# Patient Record
Sex: Female | Born: 1960 | Race: Black or African American | Hispanic: No | Marital: Married | Smoking: Never smoker
Health system: Southern US, Community
[De-identification: ages and names within clinical notes are randomized; demographics above are authoritative.]

## PROBLEM LIST (undated history)

## (undated) DIAGNOSIS — K56609 Unspecified intestinal obstruction, unspecified as to partial versus complete obstruction: Secondary | ICD-10-CM

## (undated) DIAGNOSIS — G3184 Mild cognitive impairment, so stated: Secondary | ICD-10-CM

## (undated) DIAGNOSIS — E7800 Pure hypercholesterolemia, unspecified: Secondary | ICD-10-CM

## (undated) DIAGNOSIS — T8859XA Other complications of anesthesia, initial encounter: Secondary | ICD-10-CM

## (undated) DIAGNOSIS — K5651 Intestinal adhesions [bands], with partial obstruction: Secondary | ICD-10-CM

## (undated) DIAGNOSIS — G479 Sleep disorder, unspecified: Secondary | ICD-10-CM

## (undated) DIAGNOSIS — F419 Anxiety disorder, unspecified: Secondary | ICD-10-CM

## (undated) DIAGNOSIS — O009 Unspecified ectopic pregnancy without intrauterine pregnancy: Secondary | ICD-10-CM

## (undated) DIAGNOSIS — Z9049 Acquired absence of other specified parts of digestive tract: Secondary | ICD-10-CM

## (undated) DIAGNOSIS — F329 Major depressive disorder, single episode, unspecified: Secondary | ICD-10-CM

## (undated) DIAGNOSIS — D649 Anemia, unspecified: Secondary | ICD-10-CM

## (undated) DIAGNOSIS — T4145XA Adverse effect of unspecified anesthetic, initial encounter: Secondary | ICD-10-CM

## (undated) DIAGNOSIS — E119 Type 2 diabetes mellitus without complications: Secondary | ICD-10-CM

## (undated) DIAGNOSIS — K56600 Partial intestinal obstruction, unspecified as to cause: Secondary | ICD-10-CM

## (undated) DIAGNOSIS — I10 Essential (primary) hypertension: Secondary | ICD-10-CM

## (undated) DIAGNOSIS — R7303 Prediabetes: Secondary | ICD-10-CM

## (undated) DIAGNOSIS — F32A Depression, unspecified: Secondary | ICD-10-CM

## (undated) DIAGNOSIS — K529 Noninfective gastroenteritis and colitis, unspecified: Secondary | ICD-10-CM

## (undated) HISTORY — DX: Intestinal adhesions (bands), with partial obstruction: K56.51

## (undated) HISTORY — DX: Anemia, unspecified: D64.9

## (undated) HISTORY — PX: EXPLORATORY LAPAROTOMY: SUR591

## (undated) HISTORY — DX: Acquired absence of other specified parts of digestive tract: Z90.49

## (undated) HISTORY — DX: Mild cognitive impairment, so stated: G31.84

## (undated) HISTORY — PX: UTERINE FIBROID SURGERY: SHX826

## (undated) HISTORY — DX: Unspecified intestinal obstruction, unspecified as to partial versus complete obstruction: K56.609

## (undated) HISTORY — DX: Partial intestinal obstruction, unspecified as to cause: K56.600

## (undated) HISTORY — PX: ESOPHAGOGASTRODUODENOSCOPY: SHX1529

## (undated) HISTORY — DX: Anxiety disorder, unspecified: F41.9

## (undated) HISTORY — PX: BREAST SURGERY: SHX581

## (undated) HISTORY — DX: Noninfective gastroenteritis and colitis, unspecified: K52.9

## (undated) HISTORY — PX: ECTOPIC PREGNANCY SURGERY: SHX613

## (undated) HISTORY — PX: TONSILLECTOMY: SUR1361

## (undated) HISTORY — PX: COLONOSCOPY: SHX174

## (undated) SURGERY — LYSIS, ADHESIONS, LAPAROSCOPIC
Anesthesia: General

---

## 1991-04-11 HISTORY — PX: BREAST REDUCTION SURGERY: SHX8

## 1997-10-19 ENCOUNTER — Emergency Department (HOSPITAL_COMMUNITY): Admission: EM | Admit: 1997-10-19 | Discharge: 1997-10-19 | Payer: Self-pay | Admitting: Emergency Medicine

## 1997-12-23 ENCOUNTER — Other Ambulatory Visit: Admission: RE | Admit: 1997-12-23 | Discharge: 1997-12-23 | Payer: Self-pay | Admitting: Internal Medicine

## 1999-11-01 ENCOUNTER — Encounter: Payer: Self-pay | Admitting: Gynecology

## 1999-11-01 ENCOUNTER — Ambulatory Visit (HOSPITAL_COMMUNITY): Admission: RE | Admit: 1999-11-01 | Discharge: 1999-11-01 | Payer: Self-pay | Admitting: Gynecology

## 2000-05-31 ENCOUNTER — Encounter: Admission: RE | Admit: 2000-05-31 | Discharge: 2000-08-29 | Payer: Self-pay | Admitting: Internal Medicine

## 2000-09-26 ENCOUNTER — Ambulatory Visit (HOSPITAL_COMMUNITY): Admission: RE | Admit: 2000-09-26 | Discharge: 2000-09-26 | Payer: Self-pay | Admitting: Internal Medicine

## 2000-09-26 ENCOUNTER — Encounter: Payer: Self-pay | Admitting: Internal Medicine

## 2000-09-27 ENCOUNTER — Ambulatory Visit (HOSPITAL_COMMUNITY): Admission: RE | Admit: 2000-09-27 | Discharge: 2000-09-27 | Payer: Self-pay | Admitting: *Deleted

## 2000-11-09 ENCOUNTER — Emergency Department (HOSPITAL_COMMUNITY): Admission: EM | Admit: 2000-11-09 | Discharge: 2000-11-09 | Payer: Self-pay | Admitting: Emergency Medicine

## 2000-11-09 ENCOUNTER — Encounter: Payer: Self-pay | Admitting: Emergency Medicine

## 2001-01-30 ENCOUNTER — Other Ambulatory Visit: Admission: RE | Admit: 2001-01-30 | Discharge: 2001-01-30 | Payer: Self-pay | Admitting: Gynecology

## 2001-04-10 ENCOUNTER — Emergency Department (HOSPITAL_COMMUNITY): Admission: EM | Admit: 2001-04-10 | Discharge: 2001-04-10 | Payer: Self-pay | Admitting: Emergency Medicine

## 2001-04-10 HISTORY — PX: ABDOMINAL HYSTERECTOMY: SHX81

## 2001-05-17 ENCOUNTER — Ambulatory Visit (HOSPITAL_COMMUNITY): Admission: RE | Admit: 2001-05-17 | Discharge: 2001-05-17 | Payer: Self-pay | Admitting: Obstetrics and Gynecology

## 2001-05-21 ENCOUNTER — Inpatient Hospital Stay (HOSPITAL_COMMUNITY): Admission: AD | Admit: 2001-05-21 | Discharge: 2001-05-22 | Payer: Self-pay | Admitting: Obstetrics and Gynecology

## 2001-05-21 ENCOUNTER — Encounter: Payer: Self-pay | Admitting: Obstetrics and Gynecology

## 2001-06-14 ENCOUNTER — Inpatient Hospital Stay (HOSPITAL_COMMUNITY): Admission: AD | Admit: 2001-06-14 | Discharge: 2001-06-14 | Payer: Self-pay | Admitting: Obstetrics and Gynecology

## 2001-06-29 ENCOUNTER — Inpatient Hospital Stay (HOSPITAL_COMMUNITY): Admission: AD | Admit: 2001-06-29 | Discharge: 2001-06-29 | Payer: Self-pay | Admitting: Obstetrics and Gynecology

## 2001-08-30 ENCOUNTER — Inpatient Hospital Stay (HOSPITAL_COMMUNITY): Admission: AD | Admit: 2001-08-30 | Discharge: 2001-08-30 | Payer: Self-pay | Admitting: Obstetrics and Gynecology

## 2001-10-01 ENCOUNTER — Encounter: Admission: RE | Admit: 2001-10-01 | Discharge: 2001-10-01 | Payer: Self-pay | Admitting: Obstetrics and Gynecology

## 2001-10-01 ENCOUNTER — Encounter (INDEPENDENT_AMBULATORY_CARE_PROVIDER_SITE_OTHER): Payer: Self-pay | Admitting: Specialist

## 2001-10-17 ENCOUNTER — Encounter: Payer: Self-pay | Admitting: Obstetrics and Gynecology

## 2001-10-17 ENCOUNTER — Inpatient Hospital Stay (HOSPITAL_COMMUNITY): Admission: AD | Admit: 2001-10-17 | Discharge: 2001-10-17 | Payer: Self-pay | Admitting: Obstetrics and Gynecology

## 2001-11-10 ENCOUNTER — Inpatient Hospital Stay (HOSPITAL_COMMUNITY): Admission: AD | Admit: 2001-11-10 | Discharge: 2001-11-10 | Payer: Self-pay | Admitting: Obstetrics and Gynecology

## 2001-11-16 ENCOUNTER — Inpatient Hospital Stay (HOSPITAL_COMMUNITY): Admission: AD | Admit: 2001-11-16 | Discharge: 2001-11-16 | Payer: Self-pay | Admitting: Obstetrics and Gynecology

## 2001-11-17 ENCOUNTER — Inpatient Hospital Stay (HOSPITAL_COMMUNITY): Admission: AD | Admit: 2001-11-17 | Discharge: 2001-11-23 | Payer: Self-pay | Admitting: Obstetrics and Gynecology

## 2001-11-24 ENCOUNTER — Encounter: Admission: RE | Admit: 2001-11-24 | Discharge: 2001-12-24 | Payer: Self-pay | Admitting: Obstetrics and Gynecology

## 2002-01-07 ENCOUNTER — Other Ambulatory Visit: Admission: RE | Admit: 2002-01-07 | Discharge: 2002-01-07 | Payer: Self-pay | Admitting: Obstetrics and Gynecology

## 2002-03-31 ENCOUNTER — Encounter (INDEPENDENT_AMBULATORY_CARE_PROVIDER_SITE_OTHER): Payer: Self-pay

## 2002-03-31 ENCOUNTER — Inpatient Hospital Stay (HOSPITAL_COMMUNITY): Admission: RE | Admit: 2002-03-31 | Discharge: 2002-04-02 | Payer: Self-pay | Admitting: Obstetrics and Gynecology

## 2002-04-04 ENCOUNTER — Inpatient Hospital Stay (HOSPITAL_COMMUNITY): Admission: AD | Admit: 2002-04-04 | Discharge: 2002-04-08 | Payer: Self-pay | Admitting: Obstetrics and Gynecology

## 2002-04-04 ENCOUNTER — Encounter: Payer: Self-pay | Admitting: Obstetrics and Gynecology

## 2002-04-06 ENCOUNTER — Encounter: Payer: Self-pay | Admitting: Obstetrics and Gynecology

## 2003-05-11 ENCOUNTER — Encounter: Admission: RE | Admit: 2003-05-11 | Discharge: 2003-06-25 | Payer: Self-pay | Admitting: Occupational Medicine

## 2004-02-02 ENCOUNTER — Other Ambulatory Visit: Admission: RE | Admit: 2004-02-02 | Discharge: 2004-02-02 | Payer: Self-pay | Admitting: Obstetrics and Gynecology

## 2006-05-14 ENCOUNTER — Encounter: Admission: RE | Admit: 2006-05-14 | Discharge: 2006-05-14 | Payer: Self-pay | Admitting: *Deleted

## 2007-02-04 ENCOUNTER — Emergency Department (HOSPITAL_COMMUNITY): Admission: EM | Admit: 2007-02-04 | Discharge: 2007-02-04 | Payer: Self-pay | Admitting: Emergency Medicine

## 2010-01-08 ENCOUNTER — Emergency Department (HOSPITAL_COMMUNITY): Admission: EM | Admit: 2010-01-08 | Discharge: 2010-01-09 | Payer: Self-pay | Admitting: Emergency Medicine

## 2010-06-09 ENCOUNTER — Inpatient Hospital Stay (HOSPITAL_COMMUNITY)
Admission: AD | Admit: 2010-06-09 | Discharge: 2010-06-11 | DRG: 181 | Disposition: A | Discharge status: Home / Self Care | Payer: Federal, State, Local not specified - PPO | Source: Ambulatory Visit | Attending: Internal Medicine | Admitting: Internal Medicine

## 2010-06-09 ENCOUNTER — Inpatient Hospital Stay (HOSPITAL_COMMUNITY): Payer: Federal, State, Local not specified - PPO

## 2010-06-09 DIAGNOSIS — F3289 Other specified depressive episodes: Secondary | ICD-10-CM | POA: Diagnosis present

## 2010-06-09 DIAGNOSIS — I10 Essential (primary) hypertension: Secondary | ICD-10-CM | POA: Diagnosis present

## 2010-06-09 DIAGNOSIS — K56609 Unspecified intestinal obstruction, unspecified as to partial versus complete obstruction: Principal | ICD-10-CM | POA: Diagnosis present

## 2010-06-09 DIAGNOSIS — F329 Major depressive disorder, single episode, unspecified: Secondary | ICD-10-CM | POA: Diagnosis present

## 2010-06-09 DIAGNOSIS — E119 Type 2 diabetes mellitus without complications: Secondary | ICD-10-CM | POA: Diagnosis present

## 2010-06-09 DIAGNOSIS — K5289 Other specified noninfective gastroenteritis and colitis: Secondary | ICD-10-CM | POA: Diagnosis present

## 2010-06-09 LAB — CBC
HCT: 39.3 % (ref 36.0–46.0)
Hemoglobin: 12.9 g/dL (ref 12.0–15.0)
MCH: 26.1 pg (ref 26.0–34.0)
MCHC: 32.8 g/dL (ref 30.0–36.0)
MCV: 79.4 fL (ref 78.0–100.0)
Platelets: 270 10*3/uL (ref 150–400)
RDW: 13.6 % (ref 11.5–15.5)
WBC: 12.7 10*3/uL — ABNORMAL HIGH (ref 4.0–10.5)

## 2010-06-09 LAB — COMPREHENSIVE METABOLIC PANEL
ALT: 13 U/L (ref 0–35)
AST: 16 U/L (ref 0–37)
Albumin: 3.4 g/dL — ABNORMAL LOW (ref 3.5–5.2)
BUN: 11 mg/dL (ref 6–23)
CO2: 27 mEq/L (ref 19–32)
Calcium: 9.1 mg/dL (ref 8.4–10.5)
Creatinine, Ser: 0.94 mg/dL (ref 0.4–1.2)
GFR calc Af Amer: >60 mL/min (ref >60)
GFR calc non Af Amer: >60 mL/min (ref >60)
Glucose, Bld: 85 mg/dL (ref 70–99)
Potassium: 3.5 mEq/L (ref 3.5–5.1)
Sodium: 136 mEq/L (ref 135–145)
Total Protein: 6.8 g/dL (ref 6.0–8.3)

## 2010-06-09 LAB — GLUCOSE, CAPILLARY
Glucose-Capillary: 77 mg/dL (ref 70–99)
Glucose-Capillary: 94 mg/dL (ref 70–99)

## 2010-06-09 LAB — PROTIME-INR
INR: 1 (ref 0.00–1.49)
Prothrombin Time: 13.4 seconds (ref 11.6–15.2)

## 2010-06-10 ENCOUNTER — Inpatient Hospital Stay (HOSPITAL_COMMUNITY): Payer: Federal, State, Local not specified - PPO

## 2010-06-10 LAB — URINALYSIS, ROUTINE W REFLEX MICROSCOPIC
Ketones, ur: 40 mg/dL — AB
Nitrite: NEGATIVE
Specific Gravity, Urine: 1.011 (ref 1.005–1.030)
Urine Glucose, Fasting: NEGATIVE mg/dL
pH: 6 (ref 5.0–8.0)

## 2010-06-10 LAB — DIFFERENTIAL
Basophils Absolute: 0 10*3/uL (ref 0.0–0.1)
Basophils Relative: 0 % (ref 0–1)
Eosinophils Absolute: 0.2 10*3/uL (ref 0.0–0.7)
Eosinophils Relative: 2 % (ref 0–5)
Lymphs Abs: 2.4 10*3/uL (ref 0.7–4.0)
Monocytes Absolute: 0.6 10*3/uL (ref 0.1–1.0)
Monocytes Relative: 7 % (ref 3–12)
Neutro Abs: 5.7 10*3/uL (ref 1.7–7.7)
Neutrophils Relative %: 64 % (ref 43–77)

## 2010-06-10 LAB — GLUCOSE, CAPILLARY
Glucose-Capillary: 82 mg/dL (ref 70–99)
Glucose-Capillary: 105 mg/dL — ABNORMAL HIGH (ref 70–99)
Glucose-Capillary: 119 mg/dL — ABNORMAL HIGH (ref 70–99)

## 2010-06-10 LAB — URINE MICROSCOPIC-ADD ON

## 2010-06-10 LAB — BASIC METABOLIC PANEL
BUN: 8 mg/dL (ref 6–23)
CO2: 28 mEq/L (ref 19–32)
Calcium: 8.5 mg/dL (ref 8.4–10.5)
GFR calc Af Amer: >60 mL/min (ref >60)
GFR calc non Af Amer: >60 mL/min (ref >60)
Glucose, Bld: 103 mg/dL — ABNORMAL HIGH (ref 70–99)

## 2010-06-10 LAB — CBC
Hemoglobin: 10.7 g/dL — ABNORMAL LOW (ref 12.0–15.0)
MCH: 25.5 pg — ABNORMAL LOW (ref 26.0–34.0)
MCHC: 32 g/dL (ref 30.0–36.0)
Platelets: 237 10*3/uL (ref 150–400)
RBC: 4.19 MIL/uL (ref 3.87–5.11)
RDW: 13.6 % (ref 11.5–15.5)
WBC: 8.9 10*3/uL (ref 4.0–10.5)

## 2010-06-10 MED ORDER — IOHEXOL 300 MG/ML  SOLN
80.0000 mL | Freq: Once | INTRAMUSCULAR | Status: AC | PRN
  Administered 2010-06-10: 80 mL via INTRAVENOUS

## 2010-06-11 LAB — BASIC METABOLIC PANEL
BUN: 3 mg/dL — ABNORMAL LOW (ref 6–23)
CO2: 29 mEq/L (ref 19–32)
Chloride: 107 mEq/L (ref 96–112)
Creatinine, Ser: 0.86 mg/dL (ref 0.4–1.2)
GFR calc Af Amer: >60 mL/min (ref >60)
GFR calc non Af Amer: >60 mL/min (ref >60)
Glucose, Bld: 120 mg/dL — ABNORMAL HIGH (ref 70–99)
Sodium: 140 mEq/L (ref 135–145)

## 2010-06-13 NOTE — Discharge Summary (Signed)
NAMEMarland Kitchen  BALEIGH, Charlene                  ACCOUNT NO.:  [id number]  MEDICAL RECORD NO.:  [id number]           PATIENT TYPE:  I  LOCATION:  5123                         FACILITY:  [facility]  PHYSICIAN:  Candyce Churn, M.D.DATE OF BIRTH:  Oct 24, 1960  DATE OF ADMISSION:  06/09/2010 DATE OF DISCHARGE:  06/11/2010                              DISCHARGE SUMMARY   DISCHARGE DIAGNOSES: 1. Small bowel obstruction, recurrent, resolved. 2. History of hypertension. 3. History of dyslipidemia. 4. History diet-controlled diabetes. 5. History of depression.  CONSULTATIONS:  General Surgery June 09, 2010, Dr. Maisie Fus A. Cornett.  The patient has history of small bowel obstruction in 2000 and also partial small bowel obstruction in 2003.  DISCHARGE MEDICATIONS: 1. Klonopin 0.5 mg p.o. b.i.d. p.r.n. 2. Micardis 40 mg daily. 3. Simvastatin 20 mg daily. 4. Wellbutrin 100 mg daily.  PAST SURGICAL HISTORY: 1. Hysterectomy. 2. Ectopic pregnancy. 3. Intra-abdominal adhesions noted back during hysterectomy in 2003     and she had a procedure in 2000 for small bowel obstruction, but no     operative note available.  ALLERGIES: 1. AMOXICILLIN. 2. IBUPROFEN.  RADIOGRAPHIC PROCEDURES DURING THIS HOSPITALIZATION: 1. Abdominal x-ray on March 1 revealed focal left upper quadrant ileus     versus early partial small bowel obstruction. 2. CT of the abdomen and pelvis on June 10, 2010, revealed indistinct     mildly thick wall bowel loop in the left lower quadrant resembling     possible enteritis.  There was moderate stool burden in the     rectosigmoid colon.  There was also mild associated mesenteric     lymph node prominence, some free fluid suggesting possible     enteritis with mesenteric adenitis. 3. Abdominal x-ray on June 10, 2010, reveals no evidence of bowel     obstruction or free intraperitoneal air.  DISCHARGE LABORATORY DATA:  CBC from June 10, 2010, revealed white count 8900,  hemoglobin 10.7, platelet count 237,000 with normal differential. BMET on discharge revealed sodium 140, potassium 3.8, chloride 107, bicarb 29, glucose 120, BUN 3, creatinine 0.86.  Urinalysis from March 1 revealed many squamous cells with 3-6 white cells and 3-6 red cells, and few bacteria, and was cloudy, but otherwise negative.  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  She has had numerous GYN surgeries for ectopic pregnancy and hysterectomy for fibroids in the past.  She has had documented adhesions from prior surgeries.  She started having pain 3 days prior to admission and it was infraumbilical.  It became diffuse on the day prior to admission and became quite colicky, and was actually 10/10 at its worst.  She had not had a bowel movement or flatus for several days.  She has also had no p.o. intake for several days.  She was seen at the [facility] and x-rays were done at the office revealing partial small bowel obstruction and she went home, and then pain increased and she was admitted directly to the  hospital.  She had a fever of 101 at [facility] and was complaining of chills on admission.  On admission, she was found to have normal vital signs except for slightly elevated blood pressure of 146/95.  Pulse was 84, temperature of 98.6.  X-ray was consistent with partial small bowel obstruction. She was seen in consultation by Surgery who recommended conservative care, and over 48 hours, her symptoms resolved and her SBO resolved.  She will be discharged home on soft diet for 2-3 days, then regular diet.  CONDITION ON DISCHARGE:  Much improved with resolution of small bowel obstruction.  She will follow with Dr. Trula Slade in 2 weeks and call if she develops nausea, vomiting or  abdominal pain or fever or chills.  Time spent discussing discharge with patient and reviewing chart and performing discharge summary was less than 30 minutes.     Candyce Churn, M.D.     RNG/MEDQ  D:  06/11/2010  T:  06/12/2010  Job:  [id number]  cc:   Thomas A. Cornett, M.D. Deirdre Peer. Polite, M.D.  Electronically Signed by Marden Noble M.D. on 06/13/2010 10:28:01 PM

## 2010-06-14 NOTE — Consult Note (Addendum)
[name], [name]                  ACCOUNT NO.:  [id number]  MEDICAL RECORD NO.:  [id number]           PATIENT TYPE:  I  LOCATION:  5123                         FACILITY:  [facility]  PHYSICIAN:  [name], M.D.DATE OF BIRTH:  10/08/1960  DATE OF CONSULTATION:  06/09/2010 DATE OF DISCHARGE:                                CONSULTATION   REQUESTING PHYSICIAN:  Charlene Massed, MD, [facility].  REASON FOR CONSULTATION:  Abdominal pain.  HISTORY OF PRESENT ILLNESS:  The patient is a 50 year old female with a 3-day history of diffuse abdominal pain.  It started 3 days ago in epigastrium.  Then, it began to gravitate toward the umbilicus and involved both left and right lower quadrants.  It is crampy, sharp in nature, almost like contraction-type pain, constant.  She had 1 episode of nausea and vomiting this morning.  No bowel movement for 3 days or passing flatus.  The pain is 10/10.  Nothing seems to make it better or worse.  She was recently admitted by the hospitalist.  No workup has been done yet.  I was asked to see her for abdominal pain.  PAST MEDICAL HISTORY: 1. Hypertension. 2. Hyperglycemia. 3. Hyperlipidemia. 4. History of small bowel obstruction in 2000 and then again was seen     in 2003 for a partial small bowel obstruction which resolved     nonoperatively.  PAST SURGICAL HISTORY: 1. Hysterectomy. 2. Ectopic pregnancy. 3. Intra-abdominal adhesions noted back in her hysterectomy back in     2003. 4. Apparently had a procedure in 2000 for small bowel obstruction.  No     operative note reported from that.  ALLERGIES: 1. AMOXICILLIN. 2. IBUPROFEN.  MEDICATIONS:  Micardis and Lipitor.  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  She denies tobacco or alcohol use.  REVIEW OF SYSTEMS:  As above, otherwise negative x15.  PHYSICAL EXAMINATION:  VITAL SIGNS:  Temperature 98, pulse in the 90s, blood pressure 146/76. GENERAL  APPEARANCE:  Female, in mild distress. HEENT:  Extraocular movements are intact.  No jaundice.  Oropharynx dry. NECK:  Supple, nontender.  Trachea midline.  No mass. PULMONARY:  Lung sounds are clear bilaterally.  Chest wall excursion normal. CARDIOVASCULAR:  Regular rate and regular rate and rhythm without rub, murmur, or gallop. EXTREMITIES:  Warm and well perfused. ABDOMEN:  Mildly distended.  Bowel sounds present.  Mildly hyperactive. Tender throughout, but no focal peritonitis or peritoneal signs per se. Tender in all 4 quadrants, especially in epigastrium and just above her umbilicus, soft.  No obvious hernia that I can see on exam.  Lower Pfannenstiel scar noted. EXTREMITIES:  No clubbing, cyanosis, nor edema. NEURO:  Grossly normal.  DIAGNOSTIC STUDIES:  Pending, which include abdominal and pelvic CT, plain films of the abdomen as well as CBC, CMET, and PT.  IMPRESSION: 1. Abdominal pain with history of partial small bowel obstruction in     the past. 2. Hypertension. 3. Hyperlipidemia. 4. Hyperglycemia.  PLAN:  Await workup.  Difficult to tell at this point, but differential diagnosis is quite broad, could be  partial small bowel obstruction which she has a history of, complete bowel obstruction, gastroenteritis, diverticulitis, perforated appendicitis, internal hernia, less likely ischemic colitis or small bowel ischemia which is less likely.  We will await for the workup and further recommendations at that point can be made.  Thank you for this consultation.     [name], M.D.     TAC/MEDQ  D:  06/09/2010  T:  06/10/2010  Job:  [id number]  cc:   [name], M.D.  Electronically Signed by [name] M.D. on 06/14/2010 10:34:28 AM Electronically Signed by [name] M.D. on 06/14/2010 10:46:36 AM Electronically Signed by [name] M.D. on 06/14/2010 10:56:41 AM Electronically Signed by [name] M.D. on 06/14/2010 11:06:44  AM Electronically Signed by [name] M.D. on 06/14/2010 11:17:04 AM Electronically Signed by [name] M.D. on 06/14/2010 11:29:31 AM Electronically Signed by [name] M.D. on 06/14/2010 11:42:54 AM Electronically Signed by [name] M.D. on 06/14/2010 11:56:39 AM Electronically Signed by [name] M.D. on 06/14/2010 12:11:05 PM Electronically Signed by [name] M.D. on 06/14/2010 12:26:43 PM Electronically Signed by [name] M.D. on 06/14/2010 12:43:31 PM Electronically Signed by [name] M.D. on 06/14/2010 01:01:40 PM Electronically Signed by [name] M.D. on 06/14/2010 01:21:19 PM Electronically Signed by [name] M.D. on 06/14/2010 01:42:55 PM Electronically Signed by [name] M.D. on 06/14/2010 02:02:48 PM Electronically Signed by [name] M.D. on 06/14/2010 02:23:03 PM Electronically Signed by [name] M.D. on 06/14/2010 02:45:40 PM Electronically Signed by [name] M.D. on 06/14/2010 03:09:15 PM Electronically Signed by [name] M.D. on 06/14/2010 03:34:05 PM Electronically Signed by [name] M.D. on 06/14/2010 04:09:05 PM Electronically Signed by [name] M.D. on 06/14/2010 04:40:23 PM Electronically Signed by [name] M.D. on 06/14/2010 05:12:32 PM Electronically Signed by [name] M.D. on 06/14/2010 05:12:32 PM Electronically Signed by [name] M.D. on 06/14/2010 05:42:02 PM Electronically Signed by [name] M.D. on 06/14/2010 07:36:58 PM

## 2010-06-15 NOTE — H&P (Addendum)
[name], [name]                  ACCOUNT NO.:  [id number]  MEDICAL RECORD NO.:  [id number]           Oliver TYPE:  I  LOCATION:  5123                         FACILITY:  [facility]  PHYSICIAN:  Deirdre Peer. Polite, M.D. DATE OF BIRTH:  Jul 29, 1960  DATE OF ADMISSION:  06/09/2010 DATE OF DISCHARGE:                             HISTORY & PHYSICAL   PRIMARY CARE PRACTITIONER:  Deirdre Peer. Polite, M.D. of [place], [employer].  CHIEF COMPLAINT:  Abdominal pain with nausea and vomiting for 3 days.  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  Per [name], she was in her usual state of health until 3 days ago when she started developing abdominal pain.  Initially, Charlene pain was below umbilical region.  However, for Charlene past 1 day, it is diffusely all over.  She describes Charlene pain as colicky in nature around 10 x 10 and it is worst.  There is no particular radiation of this pain and there are no particular aggravating or relieving factors.  Charlene pain has been associated with nausea and [name] has had one episode of vomiting early this morning.  She has had no bowel movement nor has she passed a flatus for Charlene past few days.  She claims she has had no p.o. intake at all for Charlene past few days.  She actually did go and see her primary care physician yesterday and x-rays were done at Charlene office and she was told that she might have had a small bowel obstruction.  [name] proceeded to go home yesterday and see what would happen, however, Charlene pain has continued to increase and therefore she has now been referred to Charlene hospitalist service as a direct admission for further evaluation  and treatment.  [name] claims she had a fever of 101 degrees Fahrenheit at a doctor's office yesterday and she does feel feverish along with chills.  However, she denies any dysuria, diarrhea, chest pain, shortness of breath, or cough or headaches as well.  There is no visual complaints.  There is no skin rash.  ALLERGIES:  [name] is allergic to AMOXICILLIN and TETRACYCLINE.  PAST MEDICAL HISTORY: 1. Hypertension. 2. Dyslipidemia. 3. Diet-controlled diabetes. 4. Depression.  PAST SURGICAL HISTORY: 1. History of an ectopic pregnancy requiring surgery. 2. Hysterectomy.  HOME MEDICATIONS: 1. Micardis. 2. Simvastatin. 3. Wellbutrin.  FAMILY HISTORY:  Mother had diabetes and hypertension.  SOCIAL HISTORY:  [name] works as a Designer, jewellery at [employer] and she has no toxic habits.  REVIEW OF SYSTEMS:  A detailed review of 12 systems were done and these are negative except for Charlene ones mentioned in Charlene HPI.  PHYSICAL EXAMINATION:  VITAL SIGNS:  Temperature of 98.6, pulse of 84, respiration of 18, blood pressure 146/95, and O2 sat of 100% on room air. GENERAL:  [name] appears to be in some distress due to pain, but is awake and alert. HEENT:  Atraumatic, normocephalic.  Pupils are equal and reactive to light and accommodation.  Oral mucosa is moist. NECK:  Supple.  No cervical lymphadenopathy. CHEST:  Bilaterally clear to auscultation. CARDIOVASCULAR:  Heart sounds are regular.  No murmurs heard. ABDOMEN:  Bowel sounds are sluggish and Charlene abdomen is diffusely tender. However, it is soft with some voluntary guarding.  No rebound was appreciated. EXTREMITIES:  No edema. SKIN:  No rash. NEUROLOGIC:  [name] is alert and oriented x3 and there are no focal neurological deficits.  LABORATORY DATA:  Currently pending.  RADIOLOGIC STUDIES:  Currently pending.  ASSESSMENT: 1. Likely bowel obstruction. 2. Hypertension appears  controlled. 3. History of diet-controlled diabetes. 4. Depression appears stable with no suicidal or homicidal ideation.  PLAN: 1. [name] will be admitted to a regular floor. 2. She will be started on IV fluids and kept n.p.o. 3. Laboratory studies have already been ordered so as an abdominal x-     ray. 4. Depending on Charlene results of those, we will decide on further     course.  [name] may even need an NG tube and perhaps a CT scan     of Charlene abdomen. 5. A surgical consultation has already been called for and I have     spoken to Dr. Luisa Hart who will evaluate [name] soon. 6. In terms of hypertension, we will see how she does over Charlene next     day or so and in Charlene meantime, we will just put on some p.r.n.     hydralazine for a systolic blood pressure more than 160. 7. She will be placed on insulin sliding scale for diabetes. 8. For DVT prophylaxis, we will use SCDs for now as she may need to go     for surgery if this bowel obstruction     does not resolve on its own. 9. Code status.  [name] is a full code.  TOTAL TIME SPENT:  45 minutes.     Charlene Massed, MD   ______________________________ Deirdre Peer. Polite, M.D.    SG/MEDQ  D:  06/09/2010  T:  06/09/2010  Job:  [id number]  cc:   Deirdre Peer. Polite, M.D.  Electronically Signed by Windy Fast POLITE M.D. on 06/14/2010 05:23:00 PM Electronically Signed by Windy Fast POLITE M.D. on 06/14/2010 05:23:00 PM Electronically Signed by Windy Fast POLITE M.D. on 06/14/2010 05:33:07 PM Electronically Signed by Windy Fast POLITE M.D. on 06/14/2010 05:33:07 PM Electronically Signed by Windy Fast POLITE M.D. on 06/14/2010 06:15:38 PM Electronically Signed by Windy Fast POLITE M.D. on 06/14/2010 06:23:06 PM Electronically Signed by Windy Fast POLITE M.D. on 06/14/2010 07:36:58 PM Electronically Signed by [name]  on 06/20/2010 03:03:36 PM

## 2010-08-26 NOTE — Consult Note (Signed)
NAME:  [name], [name]                            ACCOUNT NO.:  [id number]   MEDICAL RECORD NO.:  [id number]                   PATIENT TYPE:  INP   LOCATION:  9128                                 FACILITY:  [facility]   PHYSICIAN:  Currie Paris, M.D.           DATE OF BIRTH:  May 19, 1960   DATE OF CONSULTATION:  04/05/2002  DATE OF DISCHARGE:                                   CONSULTATION   REASON FOR CONSULTATION:  Possible post small bowel obstruction.   HISTORY OF PRESENT ILLNESS:  I was asked by Guy Sandifer. Henderson Cloud, M.D., to see  this patient.  Her history briefly is that she underwent TAH on March 31, 2002.  At the time of her surgery, she had dense adhesions.  This was from a  prior small bowel obstruction that Lebron Conners, M.D., operated on her for  in about 2000.  She relates that to a prior surgery she had for an ectopic  earlier.   After her surgery on March 31, 2002, she was discharged on April 02, 2002, apparently doing well, but that evening developed some nausea.  This  progressed such that yesterday she was having some abdominal pain and then  was admitted on April 04, 2002, because of nausea, vomiting, and  abdominal pain.  She was put on IV fluids, some pain medication, and  antinausea medication.  This morning she was feeling better.  However, her x-  rays showed either small bowel obstruction versus partial early or ileus,  hard to differentiate, and I was asked to evaluate her.   PAST MEDICAL HISTORY:  As noted in her old chart from her recent surgery and  not redictated in detail here, but is significant for the fact that she has  thalassemia minor and hypertension.   ALLERGIES:  She has a significant allergy to PENICILLIN PRODUCTS.   REVIEW OF SYSTEMS:  Basically negative, except for the two items just  mentioned.   PHYSICAL EXAMINATION:  GENERAL APPEARANCE:  The patient is a healthy-  appearing patient who is alert, oriented, and  comfortable.  VITAL SIGNS:  She has had one temperature to 100.3 degrees since admission,  but basically the blood pressure has been about 110/70, pulse of about 80,  and respirations of about 20 and they have been stable.  HEENT:  The head is normocephalic.  Eyes nonicteric.  The pupils are round  and regular.  NECK:  Supple.  No masses or thyromegaly.  LUNGS:  Clear to auscultation.  HEART:  Regular.  No murmurs, rubs, or gallops are heard.  ABDOMEN:  A Pfannenstiel incision with staples.  It does not appear to be  infected.  The abdomen is slightly distended.  It is fairly soft throughout.  She has minimal direct tenderness and no rebound tenderness.  It is not  localized.  It is more of a diffuse tenderness.  Bowel sounds  are present,  but seem a little diminished, at least at the time I listened.  RECTAL:  Not done.  EXTREMITIES:  No cyanosis or edema.   LABORATORY DATA:  Laboratory data reviewed.  The initial hemoglobin is noted  to be down around 7.4.  Her calcium is 7.7, which is a little bit low, but  consistent with her albumin of 2.7.  The white count is 9400 today.   The KUB and upright are reviewed and as noted above show some dilated loops  of small bowel, but also some air and stool in her colon.  These films were  done yesterday.   IMPRESSION:  1. Either earlier postoperative small bowel obstruction versus ileus.  2. Question of postoperative bleeding with hemoglobin down to 7.4 from a     preoperative of around 11.   RECOMMENDATIONS:  I would keep her NPO today.  I considered NG tube, but  considering that she has not gotten any worse just being NPO and not  particularly distended, I think we can wait one more day on that.  I would,  however, repeat her KUB and upright tomorrow.  If not significantly better  both clinically and radiographically, I think I would recommend an NG tube  for a couple of days to see if decompression would not help.  She is already   scheduled to have her hemoglobin rechecked, which I think is appropriate.   If she does not improve over a few days of NG suction should it come to  that, it might be helpful to get a CT scan prior to considering exploration.  She is only five days postoperative and it is not infrequent with  significant lysis of adhesions to have some delay in opening up.  Will  follow with you.                                               Currie Paris, M.D.    CJS/MEDQ  D:  04/05/2002  T:  04/05/2002  Job:  [id number]

## 2010-08-26 NOTE — Discharge Summary (Signed)
[facility]  Patient:    [name], [name] Visit Number: [id number] MRN: [id number]          Service Type: OBS Location: [id number] Attending Physician:  Marcelle Overlie Dictated by:   Guy Sandifer. Arleta Creek, M.D. Admit Date:  05/20/2001 Discharge Date: 05/22/2001                             Discharge Summary  REASON FOR ADMISSION:         This patient is a 50 year old married black female, G4, P0, AB3, at an estimated gestational age of 12-5/7 weeks who underwent cervical cerclage on May 17, 2001. She presents to triage complaining of lower abdominal pain with fever and chills. Evaluation reveals her temperature to be 101.2. The uterus, which is enlarged secondary to uterine leiomyomata, is tender to palpation. There is no rebound or guarding. Pelvic exam is deferred. White blood cell count is 15.6, hemoglobin is 10.8 with 89% neutrophils. Fetal heart tones are 165 beats per minute.  HOSPITAL COURSE:              The patient was admitted to the hospital and placed on IV fluids and IV clindamycin. Blood cultures are drawn. On May 21, 2001 at 12:50 p.m., she is feeling better with less pain. She has no abnormal discharge, no leaking, and no vaginal bleeding. Vital signs are stable. She is afebrile. Fibroid uterus is nontender. IV fluids and IV antibiotics are continued.  On May 22, 2001 she still has some discomfort of the uterus although it is distinctly different from the discomfort she had on admission. She remains without complaints of leaking, bleeding, or abnormal discharge. She remains afebrile. Fetal heart tones are in the 150s. White count is 9.9 and a hemoglobin of 9.3 is noted.  CONDITION ON DISCHARGE:       Good.  DIET:                         Regular as tolerated.  ACTIVITY:                     Modified bed rest, no pelvic entry, no lifting. She is to call the office for problems including, but not limited  to, temperature of 101 or greater, increasing pain, abnormal discharge, leaking or bleeding.  DISCHARGE MEDICATIONS:        1. Clindamycin 300 mg q.i.d. for seven days.                               2. Prenatal vitamins daily.  DISCHARGE FOLLOWUP:           The patient is to follow up in the office in less than one week.Dictated by:   Guy Sandifer Arleta Creek, M.D. Attending Physician:  Marcelle Overlie DD:  06/10/01 TD:  06/10/01 Job: [id number]

## 2010-08-26 NOTE — Discharge Summary (Signed)
   NAME:  [name], [name]                            ACCOUNT NO.:  [id number]   MEDICAL RECORD NO.:  [id number]                   PATIENT TYPE:  INP   LOCATION:  9127                                 FACILITY:  [facility]   PHYSICIAN:  Guy Sandifer. Arleta Creek, M.D.           DATE OF BIRTH:  Oct 10, 1960   DATE OF ADMISSION:  03/31/2002  DATE OF DISCHARGE:  04/02/2002                                 DISCHARGE SUMMARY   ADMISSION DIAGNOSIS:  Uterine leiomyomata.   DISCHARGE DIAGNOSIS:  Uterine leiomyomata and dense pelvic adhesions.   PROCEDURE:  March 31, 2002:  Total abdominal hysterectomy, lysis of  adhesions, and repair of cystotomy.   REASON FOR ADMISSION:  This patient is a 49 year old married black female,  G4, P1, with known uterine leiomyomata which are becoming worse symptomatic.  Details are dictated in the History & Physical.  She is admitted for  surgical management.   HOSPITAL COURSE:  The patient is admitted to the hospital and undergoes the  above procedure.  Estimated blood loss is 275 cc.  Cystotomy is encountered  during the procedure and silastic Foley is placed.  On the evening of  surgery, she has good relief of pain and is ambulating well without nausea  or vomiting.  Vital signs are stable.  She is afebrile with clear urine  output.  On the first postoperative day, she has good pain relief, is  tolerating liquids, and is ambulating well.  She has not yet passed flatus.  She remains afebrile with stable vitals.  White count is 10.4, hemoglobin  11.1.  Platelet count is 332,000.  On the day of discharge, she is  tolerating a regular diet, passing flatus, ambulating, and remains afebrile.   CONDITION ON DISCHARGE:  Good.   DIET:  Regular as tolerated.   ACTIVITY:  No lifting, operation of automobiles, no vaginal entry.   DISCHARGE INSTRUCTIONS:  She is to call the office with problems including  but not limited to temperature of 101 degrees, persistent nausea/vomiting,  increasing pain, or heavy vaginal bleeding.   MEDICATIONS:  1. Macrobid, #20, 1 p.o. b.i.d. while Foley is in place.  2. Percocet 5/325 mg, #30, 1-2 p.o. q.6h. p.r.n.  3. Multivitamin daily.   FOLLOW UP:  She is to follow up in the office in 8-10 days for reevaluation  and removal of Foley catheter and of skin staples.                                                Guy Sandifer Arleta Creek, M.D.   JET/MEDQ  D:  04/02/2002  T:  04/03/2002  Job:  [id number]

## 2010-08-26 NOTE — Op Note (Signed)
NAME:  [name], [name]                            ACCOUNT NO.:  [id number]   MEDICAL RECORD NO.:  [id number]                   PATIENT TYPE:  INP   LOCATION:  9145                                 [facility]ACILITY:  [facility]   PHYSICIAN:  Juluis Mire, M.D.                DATE OF BIRTH:  1960-07-23   DATE OF PROCEDURE:  11/17/2001  DATE OF DISCHARGE:                                 OPERATIVE REPORT   PREOPERATIVE DIAGNOSIS:  1. Intrauterine pregnancy at term with failure to progress.  2. Chorioamnionitis.  3. Fetal intolerance through adequate labor.   POSTOPERATIVE DIAGNOSIS:  1. Intrauterine pregnancy at term with failure to progress.  2. Chorioamnionitis.  3. Fetal intolerance through adequate labor.   OPERATIVE PROCEDURE:  Low transverse cesarean section.   SURGEON:  Juluis Mire, M.D.   ANESTHESIA:  Epidural.   ESTIMATED BLOOD LOSS:  800-100 cc.   PACKS AND DRAINS:  None.   INTRAOPERATIVE BLOOD REPLACED:  None.   COMPLICATIONS:  None.   INDICATIONS:  Indications are as follows.  The patient is a 50 year old  gravida 4, para 0, abortus 3, married black female with estimated  gestational age of 38-1/2 weeks.  Her prenatal course has been complicated  by chronic hypertension, positive group beta strep screen and advanced  maternal age.  Also, history of uterine fibroids.  She also has a history of  uterine fibroids.  She also had as history of abdominal pelvic adhesions  from previous surgery for an apparent ectopic pregnancy.  She did suffer  small bowel obstruction postoperatively requiring exploratory surgery.  The  patient presented with spontaneous rupture of membranes.  She was begun on  VI Cleocin.  She did begun running a maternal temperature.  She progressed  to 4-5 cm of dilatation and had complete arrest of dilatation.  When we  would add Pitocin in fetal heart rate would have repetitive deep variable  decelerations with a marked late component.  This did resolve.   The Pitocin  was discontinued but no progression of cervical changes was noted.  She did  have an intrauterine pressure catheter in as well as scalp electrode.  Due  to the lack of progression and the fetal intolerance to apparent adequate  labor and the worsening maternal fevers, we decided to proceed with primary  cesarean section.  The risks have been discussed including the risks of  infection, the risks of hemorrhage that could necessitate transfusion, risks  of AIDS or hepatitis, the risks of injury to adjacent organs, including  bowel, bladder or ureters that could require further exploratory surgery,  risks of deep venous thrombosis and pulmonary embolus.   DESCRIPTION OF PROCEDURE:  The patient was taken to the operating room and  placed in the supine position with left lateral tilt.  After a satisfactory  level of epidural anesthesia was obtained, the abdomen was prepped out  with  Betadine and draped as a sterile field.  The prior low transverse skin  incision was noted and the incision was made inferior to this.  The incision  was then extended to the subcutaneous tissue.  The fascia was entered  sharply and incision in the fascia extended laterally.  The fascia was taken  off the muscle superiorly and inferiorly.  Rectus muscles were separated in  the midline.  We entered the anterior peritoneum sharply and incision to the  peritoneum extended both superiorly and inferiorly.  Some of the descending  sigmoid colon was noted towards the left side.  The incision was not  involved in the abdominal incision.  A bladder flap was then developed.  A  low transverse uterine incision was begun with the knife and extended  laterally with manual traction.  Amniotic fluid remained clear.  The infant  was in the vertex presentation and delivered with elevation of the head and  fundal pressure.  The infant was a viable female who weighed 6 pounds 9  ounces.  Apgars were 8 and 9.  Umbilical  cord pH was 7.09.  The placenta was  then delivered manually and was sent for pathological review.  The uterus  was closed with running locking suture of 0 chromic using a two layer  closure technique.   We then visualized the left lower quadrant where the descending sigmoid  colon was noted near the incision and was not involved in any operative  process.  We could see the tube and ovary on that side and it was involved  in adhesions.  The right tube was surgically absent.  The right ovary was  involved in adhesions but otherwise unremarkable.  She had several small  subserosal fibroids.  At this point in time we thoroughly irrigated the  pelvis.  Hemostasis was excellent.  Urine output was blood tinged but was  clearing.  Close examination of the bladder revealed no evidence of injury.  Muscles of the peritoneum were closed with a running suture of 3-0 Vicryl.  The fascia was closed with a running suture of 0 PDS. The skin was closed  with staples and Steri-Strips.  Sponge, instrument and needle counts were  reported as correct by the circulating nurse x 2.  Foley catheter was clear  at the time of closure.  The patient tolerated the procedure well and was  returned to the recovery room in good condition.                                               Juluis Mire, M.D.    JSM/MEDQ  D:  11/17/2001  T:  11/19/2001  Job:  [id number]

## 2010-08-26 NOTE — Discharge Summary (Signed)
NAME:  KATLIN, BORTNER                            ACCOUNT NO.:  [id number]   MEDICAL RECORD NO.:  [id number]                   PATIENT TYPE:  INP   LOCATION:  9128                                 [facility]ACILITY:  [facility]   PHYSICIAN:  Guy Sandifer. Arleta Creek, M.D.           DATE OF BIRTH:  10-01-1942   DATE OF ADMISSION:  04/04/2002  DATE OF DISCHARGE:  04/08/2002                                 DISCHARGE SUMMARY   ADMITTING DIAGNOSES:  Partial small bowel obstruction versus ileus.   DISCHARGE DIAGNOSES:  Ileus.   REASON FOR ADMISSION:  The patient is a 50 year old black female status post  total abdominal hysterectomy on March 31, 2002.  She was noted to have  dense adhesions at the time of surgery.  She also underwent cystotomy and  had an indwelling Foley catheter, was on Macrobid when she was home.  After  about 24 hours she developed progressively worsening nausea and vomiting and  abdominal pain.  Upon evaluation on April 05, 2002 she was found to have  a mildly distended abdomen.  White count was 11.8, hemoglobin 8.7, platelet  count 360,000 with a slight left shift.  Urine was concentrated.  Abdominal  films were consistent with partial small bowel obstruction versus ileus.  There was no free air.  She was admitted for further management.   HOSPITAL COURSE:  The patient was given IV fluids, PCA morphine, and  Levaquin.  On the morning of April 05, 2002 she was up in a chair, but  not passing flatus.  She continued to require PCA morphine for pain relief.  Her maximum temperature is 100.3.  Abdomen remained soft with positive bowel  sounds, although they were decreased.  White count decreased to 9.4,  hemoglobin 7.4.  Her calcium was decreased to 7.7.  Magnesium was normal at  1.7.  IV fluids were continued.  She was given calcium replacement and  observed further.  Later that morning she continued to be without change.  General surgical consultation was then obtained.  Currie Paris, M.D.  saw the patient and agreed with continued conservative management at that  time.  On April 06, 2002 she was noted to pass a small amount of flatus.  No nausea/vomiting.  Was feeling better.  She was afebrile with stable vital  signs.  Abdomen was soft, was not distended with positive, but decreased  bowel sounds in all four quadrants.  White count is 8.5, hemoglobin 7.2,  platelets 324,000.  Calcium was 7.8.  Repeat x-ray was consistent with  ileus.  The patient was given sips of liquids, additional calcium  supplementation.  On April 07, 2002 she was feeling better, passing some  flatus, but had not yet had a bowel movement.  She was tolerating some  liquids.  Was having much less pain not requiring PCA morphine.  She  remained afebrile.  On the evening of  April 07, 2002 she had a first a  loose green stool.  She later had a more formed stool.  On the day of  April 08, 2002 she was tolerating a regular diet without pain and without  nausea or vomiting.  She remained afebrile.  She did have an old IV site on  the inner aspects of both arms which had some edema and erythema on the  evening of April 07, 2002.  On the day of discharge the edema had greatly  subsided.  There was no further erythema or warmth.   CONDITION ON DISCHARGE:  Good.   DIET:  Regular, as tolerated.   ACTIVITY:  No lifting, no operation of automobiles, no vaginal entry.   MEDICATIONS:  Cipro 250 mg number six one p.o. b.i.d. to begin tomorrow.  She will take this until the catheter is discontinued.    DISCHARGE INSTRUCTIONS:  She will call for problems including, but not  limited to, persistent nausea/vomiting, increasing pain, or heavy vaginal  bleeding.   FOLLOW UP:  In the office in three days.                                               Guy Sandifer Arleta Creek, M.D.    JET/MEDQ  D:  04/08/2002  T:  04/08/2002  Job:  [id number]

## 2010-08-26 NOTE — H&P (Signed)
NAME:  [name], [name]                            ACCOUNT NO.:  [id number]   MEDICAL RECORD NO.:  [id number]                   PATIENT TYPE:  INP   LOCATION:  NA                                   [facility]   PHYSICIAN:  Guy Sandifer. Arleta Creek, M.D.           DATE OF BIRTH:  01-Feb-1961   DATE OF ADMISSION:  03/24/2002  DATE OF DISCHARGE:                                HISTORY & PHYSICAL   CHIEF COMPLAINT:  Uterine fibroids.   HISTORY OF PRESENT ILLNESS:  The patient is a 50 year old married black  female G4, P1, AB 3 with known large uterine leiomyomata which is causing  ongoing pelvic pain as well as very heavy vaginal bleeding.  On physical  examination uterus is about 14-weeks size and football shaped.  On  ultrasound of January 15, 2002 uterus measures 12.2 x 10.2 x 10.3 cm with a  5.9 cm and a 5.7 cm fibroid noted as well as six other smaller ones  scattered throughout the uterus.  Ovaries are normal.  After a careful  discussion of the options, the patient is being admitted for total abdominal  hysterectomy.   PAST MEDICAL HISTORY:  1. Chronic hypertension.  2. Postpartum depression.  3. Thalassemia minor.   PAST SURGICAL HISTORY:  1. Bilateral breast augmentation 1992.  2. Laparotomy in 1997 with Dr. Orson Slick for intestinal problems.  3. Right salpingectomy for ectopic pregnancy.   OBSTETRIC HISTORY:  Ectopic pregnancy x1, miscarriage x1, 16-week loss x1  and cesarean section for a viable female child on November 17, 2001.   MEDICATIONS:  1. Wellbutrin XL 100 mg daily.  2. Chromagen b.i.d.  3. Altace 5 mg q.a.m.  4. Prenatal vitamins daily.   ALLERGIES:  1. AMOXICILLIN leading to anaphylaxis.  2. NONSTEROIDAL AGENTS leading to anaphylaxis and renal failure.  3. TETRACYCLINE leading to rash.   SOCIAL HISTORY:  The patient is a Designer, jewellery at [employer].  She  denies tobacco, alcohol, or drug abuse.   REVIEW OF SYSTEMS:  Negative except as above.   PHYSICAL  EXAMINATION:  VITAL SIGNS:  Height 5 feet 4-1/2 inches, blood  pressure 130s/80s.  HEENT:  Without thyromegaly.  LUNGS:  Clear to auscultation.  HEART:  Regular rate and rhythm.  BACK:  Without CVA tenderness.  BREASTS:  Without mass, retraction, discharge.  ABDOMEN:  Soft with a midline mass arising in the pelvis and extending one  third to one half of the way to the umbilicus.  No rebound tenderness.  PELVIC:  Vulva, vagina, cervix without lesion.  Uterus is approximately 14  weeks in size, football shaped, mildly tender.  Adnexal exam compromised by  the uterus.  EXTREMITIES:  Grossly within normal limits.  NEUROLOGICAL:  Grossly within normal limits.   ASSESSMENT:  Fibroid uterus.   PLAN:  Total abdominal hysterectomy.  Guy Sandifer Arleta Creek, M.D.    JET/MEDQ  D:  03/24/2002  T:  03/24/2002  Job:  [id number]

## 2010-08-26 NOTE — Op Note (Signed)
NAME:  [name], [name]                            ACCOUNT NO.:  [id number]   MEDICAL RECORD NO.:  [id number]                   PATIENT TYPE:  INP   LOCATION:  9127                                 [facility]   PHYSICIAN:  Guy Sandifer. Arleta Creek, M.D.           DATE OF BIRTH:  [birth year]   DATE OF PROCEDURE:  03/31/2002  DATE OF DISCHARGE:                                 OPERATIVE REPORT   PREOPERATIVE DIAGNOSES:  Uterine leiomyomata.   POSTOPERATIVE DIAGNOSES:  1. Uterine leiomyomata.  2. Dense pelvic adhesions.   SURGEON:  Guy Sandifer. Henderson Cloud, M.D.   ASSISTANT:  Juluis Mire, M.D.   PROCEDURE:  Total abdominal hysterectomy, lysis of adhesions, and repair of  cystotomy.   ANESTHESIA:  General with endotracheal intubation.   ESTIMATED BLOOD LOSS:  275 cc.   SPECIMENS:  Uterus.   COMPLICATIONS:  Cystotomy.   INDICATIONS FOR PROCEDURE:  This patient is a 50 year old married black  female, G4, P1, AB3, with known leiomyomata and continued pelvic pain.  Details are dictated in the history and physical.  Total abdominal  hysterectomy is discussed with the patient.  Removal of an ovary only if it  is distinctly abnormal was discussed.  The potential risks and complications  have been discussed preoperatively including, but not limited to infection,  bowel, bladder, and ureteral damage, bleeding requiring transfusion of blood  products, possible transfusion reaction, HIV and hepatitis acquisition, DVT,  PE, pneumonia, fistula formation, postoperative pain, and dyspareunia.  All  questions have been answered, and consent is signed on the chart.   FINDINGS:  There are multiple adhesions of the bowel and omentum along the  left anterior abdominal wall extending down to the left adnexa.  The uterus  is enlarged to 12-14 weeks size with multiple leiomyomata.  The anterior cul-  de-sac has multiple filmy adhesions.  The posterior cul-de-sac is clean.  The right tube and ovary are  normal.   PROCEDURE:  The patient is taken to the operating room where she is placed  in the dorsal supine position.  General anesthesia is induced via  endotracheal intubation.  She is prepped abdominally and vaginally.  Foley  catheter is placed in the bladder as a drain, and she is draped in a sterile  fashion.  The skin is entered through the previous Pfannenstiel incision,  and dissection is carried out in layers to the peritoneum.  The peritoneum  is carefully taken down superiorly and inferiorly.  In order to identify the  left adnexa and allow room for proper placement of the retractors, sharp and  blunt dissection is used to take down some of the adhesions around the left  adnexa.  This identifies a normal-looking left ovary.  This is all done  quite carefully sharply and bluntly.  This allowed placement of the  O'Sullivan-O'Connor retractor with placement of the bladder blade, packing  of the  bowel, and placement of the superior blade.  Care is taken that the  lateral blades are not digging into her sides.  Kelly clamps are placed on  the proximal ligaments which elevates the uterus.  The right round ligament  is then carefully identified, taken down for LigaSure cautery device, and  taken down sharply.  The anterior leaf of the broad ligament was taken down  sharply and bluntly.  The posterior leaf is bluntly perforated, and the  proximal ligaments are taken down again with the LigaSure, achieving good  hemostasis.  This allows the right uterine vessel to be skeletonized and  taken down again with the LigaSure. Further dissection of multiple filmy  adhesions from the anterior cul-de-sac to the uterus are carried out.  The  round ligament, tube, and utero-ovarian ligament are then all taken down  singly with the LigaSure, which again achieves good hemostasis.  The left  uterine vessels are then taken down with the LigaSure.  Then using a  malleable retractor as a back stop, the  uterine fundus is removed.  At this  point, careful inspection reveals that there is a cystotomy in the center of  the dome of the bladder.  Careful inspection reveals no damage to any other  areas of the bladder.  The cervical stump is then dissected away from the  overlying bladder.  The proximal ligaments are taken down with a LigaSure,  as well as the uterosacral ligaments bilaterally.  The vaginal fornix was  entered bilaterally with Heaney clamps, and the pedicles are ligated with 0  Vicryls.  All sutures will be 0 Monocryl unless otherwise designated.  The  remainder of the cervical stump is removed.  Inspection reveals that the  cervix is removed in toto.  The cuff is closed with figure-of-eight 0  Monocryl sutures.  The suture on the uterosacral ligament is then tied in  the midline to plicate the uterosacral ligaments.  The cystostomy is then  repaired with a running layer of 3-0 chromic suture and another imbricating  layer of 3-0 Vicryl suture.  Good hemostasis is noted.  Copious irrigation  is carried out, and return is clear.  Packs are removed.  Retractors are  removed.  Careful inspection again reveals all visible portions of the bowel  to be intact.  The anterior rectus fascia is reapproximated in a running  fashion with 0 PDS suture.  The skin is closed with clips.  The regular  Foley catheter is replaced with a Silastic Foley catheter.  All counts are  correct.  The patient is awakened and taken to the recovery room in stable  condition.                                               Guy Sandifer Arleta Creek, M.D.    JET/MEDQ  D:  03/31/2002  T:  03/31/2002  Job:  [id number]

## 2010-08-26 NOTE — Op Note (Signed)
[facility]  Patient:    [name], [name] Visit Number: [id number] MRN: [id number]          Service Type: DSU Location: [facility] Attending Physician:  Soledad Gerlach Dictated by:   Guy Sandifer Arleta Creek, M.D. Proc. Date: 05/17/01 Admit Date:  05/17/2001 Discharge Date: 05/17/2001                             Operative Report  PREOPERATIVE DIAGNOSES:       1. Intrauterine pregnancy at approximately                                  12-1/2 weeks estimated gestational age.                               2. Incompetent cervix.  POSTOPERATIVE DIAGNOSES:      1. Intrauterine pregnancy at approximately                                  12-1/2 weeks estimated gestational age.                               2. Incompetent cervix.  PROCEDURE:                    [employer] cervical cerclage.  SURGEON:                      Guy Sandifer. Arleta Creek, M.D.  ANESTHESIA:                   Spinal, Ellison Hughs., M.D.  ESTIMATED BLOOD LOSS:         Less than 50 cc.  INDICATIONS AND CONSENT:      This patient is a 50 year old married black female with probable incompetent cervix. Details are dictated in the history and physical. [employer] cervical cerclage is discussed with the patient. Potential risks and complications have been discussed including, but not limited to, infection, chorioamnionitis, ruptured membranes, heavy vaginal bleeding, all of which could lead to loss of the pregnancy. All questions have been answered and consent is signed on the chart. In the operating room, prior to placement of the spinal anesthesia, the ultrasound is used and demonstrates fetal heartbeat and good fetal motion.  DESCRIPTION OF PROCEDURE:     The patient was taken to the operating room where a spinal anesthetic is placed. She is then placed in the dorsal lithotomy position where she is prepped, bladder is straight catheterized, and she is draped in a sterile fashion.  Examination reveals the cervix to be closed and thick. Weighted speculum and retractor are then placed. Using 0 Novafil, a single [employer] cerclage is then placed in the usual fashion. This was done below the level of the bladder. This was tied down with a double knot creating a loop at the 12 oclock position, which could be grasped later in the office to remove the stitch. Good hemostasis is noted after tying the stitch down. All counts are correct. The patient is awakened and taken to the recovery room in stable condition. Dictated by:   Guy Sandifer Tomblin II,  M.D. Attending Physician:  Cordelia Pen Ii DD:  05/17/01 TD:  05/19/01 Job: [id number]

## 2010-08-26 NOTE — Discharge Summary (Signed)
NAME:  [name], [name]                            ACCOUNT NO.:  [id number]   MEDICAL RECORD NO.:  [id number]                   PATIENT TYPE:  INP   LOCATION:  9145                                 FACILITY:  [facility]   PHYSICIAN:  Freddy Finner, M.D.                DATE OF BIRTH:  [birth year]   DATE OF ADMISSION:  11/17/2001  DATE OF DISCHARGE:  11/23/2001                                 DISCHARGE SUMMARY   ADMITTING DIAGNOSES:  1. Intrauterine pregnancy at 38-1/[redacted] weeks gestation.  2. Spontaneous rupture of membranes.  3. Chorioamnionitis.  4. Fetal intolerance to labor.  5. Failure to progress.   DISCHARGE DIAGNOSES:  1. Status post low transverse cesarean delivery.  2. Viable female infant.  3. Postpartum endometritis, resolved.   PROCEDURE:  Primary low transverse cesarean section.   REASON FOR ADMISSION:  Please see written H&P.   HOSPITAL COURSE:  The patient presented to the hospital at 38-1/2 weeks  estimated gestational age with spontaneous rupture of membranes with uterine  contractions.  Fluid was noted to be clear.  Fetal heart tones were in the  140s.  Maternal temperature was noted to be 100 degrees Fahrenheit.  The  patient had history of cervical cerclage for cervical incompetence.  Cerclage had been removed approximately one to two weeks previously.  The  patient was allergic to penicillin and tetracycline.  She was also known to  have positive group B beta Strep.  Cleocin was administered intravenously.  CBC revealed hemoglobin at 11.1, platelet count 221,000, and WBC 14.5.  Pitocin was started to augment her labor.  Cervix on admission was noted to  be 4 cm dilated, 50% effaced with vertex presentation, noted to be high in  the pelvis.  Variable decelerations were noted on the monitor.  Scalp lead  was applied with an intrauterine pressure catheter.  Position changes were  made and patient was well hydrated.  Despite adequate labor and deceleration  pattern,  decision was made to proceed with a primary low transverse cesarean  delivery.  The patient was taken to the operating room where epidural  anesthesia was adequately dosed to a surgical level.  A low transverse  incision was made with the delivery of a viable female infant weighing 6  pounds 9 ounces, Apgars of 8 at one minute and 9 at five minutes.  Arterial  cord pH was 7.09.  The patient tolerated procedure well and was taken to the  recovery room in stable condition.  On postoperative day one patient had  good relief of pain.  Abdomen was soft with few bowel sounds.  Vital signs  were stable with temperature maximum noted at 102.1.  Temperature this a.m.  was noted at 100.7.  Laboratories revealed hemoglobin of 9.1, WBC 16.1,  platelet count 161,000.  Gentamicin was added to IV antibiotic regimen.  On  postoperative day two temperature  was noted to be 99.5.  The patient was  feeling better with lungs clear to auscultation.  Incision was noted to be  clean, dry, and intact.  Uterus was nontender.  On postoperative day three  patient complained of itching.  Fundus was firm with some fundal and right  margin of incision was noted to be tender.  No edema, erythema, or  ecchymosis was noted.  Skin edges were well opposed.  Some firmness was  noted at the right margin of the incision.  Staples were intact.  The  patient was tolerating a regular diet without complaints of nausea and  vomiting.  She was ambulating well in the room.  Laboratories revealed WBC  down to 14.9, hemoglobin 8.8, and platelet count 204,000.  Medicines were  changed to Tequin.  On postoperative day four itching had improved.  The  patient was afebrile.  Abdomen was soft and uterus was noted to be less  tender.  Firmness continued at the right margin of the incision.  Laboratories revealed WBC 15.9, hemoglobin 9.5.  On postoperative day five  patient was feeling better.  Abdomen was soft.  Uterus was nontender.  Plan   was to discharge in the morning.  On postoperative day six patient was now  afebrile x36 hours.  The uterus was nontender.  Incision was clean, dry, and  intact.  Staples were left in place and patient was discharged home.   CONDITION ON DISCHARGE:  Good.   DIET:  Regular, as tolerated.   ACTIVITY:  No heavy lifting.  No driving x2 weeks.  No vaginal entry.   FOLLOW UP:  The patient is to follow up in the office in one week for an  incision check and staple removal.  She is to call for temperature greater  than 100 degrees, persistent nausea or vomiting, heavy vaginal bleeding,  and/or redness or drainage from the incisional site.   DISCHARGE MEDICATIONS:  1. Percocet 5/325 one p.o. q.4-6h. p.r.n. pain.  2. Aldomet 250 mg one p.o. daily.  3. Prenatal vitamins one p.o. daily.  4. Colace one p.o. daily p.r.n.  5. Tequin 400 mg one p.o. daily x3 additional days.     Judith Blonder, N.P.                     Freddy Finner, M.D.    CGC/MEDQ  D:  01/01/2002  T:  01/01/2002  Job:  [id number]

## 2010-08-26 NOTE — H&P (Signed)
NAME:  [name], [name]                            ACCOUNT NO.:  [id number]   MEDICAL RECORD NO.:  [id number]                   PATIENT TYPE:  INP   LOCATION:  9128                                 FACILITY:  [facility]   PHYSICIAN:  Guy Sandifer. Arleta Creek, M.D.           DATE OF BIRTH:  [birth year]   DATE OF ADMISSION:  04/04/2002  DATE OF DISCHARGE:                                HISTORY & PHYSICAL   CHIEF COMPLAINT:  Nausea and vomiting.   HISTORY OF PRESENT ILLNESS:  The patient is a 50 year old married black  female status post total abdominal hysterectomy on March 31, 2002.  At  the time of hysterectomy she had very dense adhesions of the small bowel to  the lower left anterior abdominal wall and the left adnexa.  Cystostomy was  encountered at the time of hysterectomy.  The patient was discharged home on  April 02, 2002 with her Foley catheter in place.  She was taking Macrobid  b.i.d.  She reports doing fine on the day of discharge.  The following day  she was passing flatus and ambulating without difficulty.  On the evening of  April 02, 2002, she began to have nausea.  On April 03, 2002, she was  unable to retain any liquids or ice chips or medication at all.  She also  complained of abdominal pain throughout her abdomen.  She was unable to pass  any flatus at all after April 02, 2002.  She presented to triage at  [facility] with the above complaints but her temperature was noted to  be 100.4.  Pulse was 80, respiratory rate was 24, and blood pressure was  155/79.  She was given IV fluids and Phenergan and IV morphine.  Reevaluation of vital signs revealed temperature of 98.8, pulse of 80,  respiratory rate of 18, and blood pressure of 118/76.  Her white count is  11.8 with a small left shift.  Hemoglobin is stable at 8.7.  Urine was  concentrated with microscopic blood consistent with indwelling Foley  catheter.  Chest x-ray was normal and her abdominal films were  consistent  with partial small-bowel obstruction versus ileus.  There was no free air.  The patient then admitted to the hospital.   PAST MEDICAL HISTORY:  1. Uterine leiomyomata.  2. Chronic hypertension.  3. History of postpartum depression.  4. Thalassemia minor.   PAST SURGICAL HISTORY:  1. Total abdominal hysterectomy as above.  2. Breast augmentation 1992.  3. Laparotomy 1997 for intestinal problems.  4. Right salpingectomy for ectopic pregnancy.   OBSTETRICAL HISTORY:  Ectopic pregnancy x1, miscarriage x1, 16-week loss x1  and cesarean section for a viable female child on November 17, 2001.   MEDICATIONS:  1. Wellbutrin XL 100 mg daily.  2. Chromagen b.i.d.  3. Altace 5 mg daily.  4. Macrobid b.i.d.   ALLERGIES:  1. AMOXICILLIN leading to anaphylaxis.  2. NONSTEROIDAL AGENTS leading to anaphylaxis and renal failure.  3. TETRACYCLINE leading to rash.   SOCIAL HISTORY:  The patient is a Designer, jewellery at [employer].  She  denies tobacco, alcohol, or drug abuse.   REVIEW OF SYSTEMS:  As above.   PHYSICAL EXAMINATION:  VITAL SIGNS:  Height 5 feet 4-1/2 inches.  Blood  pressure 116/72, respiratory rate 18, pulse 80 and regular.  GENERAL:  The patient is smiling in no acute distress.  SKIN:  Warm and dry.  HEENT:  Without thyromegaly.  LUNGS:  Clear to auscultation.  HEART:  Regular rate and rhythm.  BACK:  Without CVA tenderness.  BREASTS:  Not examined.  ABDOMEN:  Mildly distended, soft with positive bowel sounds in all four  quadrants.  PELVIC:  Exam deferred.  EXTREMITIES:  PAS hose are in place.   LABORATORIES:  As above.   RADIOGRAPHS:  As above.   ASSESSMENT:  Partial small-bowel obstruction versus ileus.   PLAN:  N.p.o., IV fluids, Levaquin 500 mg q.d., repeat CBC and metabolic  panel in the morning.                                               Guy Sandifer Arleta Creek, M.D.    JET/MEDQ  D:  04/04/2002  T:  04/04/2002  Job:  [id number]

## 2010-08-26 NOTE — H&P (Signed)
[facility]  Patient:    [name], [name] Visit Number: [id number] MRN: [id number]          Service Type: Attending:  Guy Sandifer. Arleta Creek, M.D. Dictated by:   Guy Sandifer Arleta Creek, M.D. Adm. Date:  05/17/01                           History and Physical  CHIEF COMPLAINT:              Incompetent cervix.  HISTORY OF PRESENT ILLNESS:   This patient is a 50 year old married black female G4, P0, AB3 with a history of a 16 week loss suspicious for incompetent cervix.  Evaluation to date has included ultrasound on May 07, 2001 documenting an intrauterine pregnancy at 11 weeks and 0 days.  Cervical length at that time was 3.4 cm.  On physical examination cervix is closed and thick. After discussion of the options patient is being admitted for a McDonald cervical cerclage.  Pregnancy has also been complicated by history of ectopic pregnancy, uterine leiomyomata, chronic hypertension, advanced maternal age, history of positive PPD with a negative chest x-ray in 2002.  PAST MEDICAL HISTORY:         See above.  Also includes history of thalassemia minor.  PAST SURGICAL HISTORY:        1. Bilateral breast augmentation 1992.                               2. Laparotomy 1997.                               3. Right salpingectomy 1983.  MEDICATIONS:                  1. Aldomet 250 mg daily.                               2. Prenatal vitamins daily.  ALLERGIES:                    AMPICILLIN leading to anaphylaxis, history of renal failure on NONSTEROIDAL AGENTS.  FAMILY HISTORY:               Chronic hypertension in father, diabetes in mother, Alzheimers disease in paternal grandmother.  SOCIAL HISTORY:               Patient denies tobacco, alcohol, or drug abuse.  REVIEW OF SYSTEMS:            Negative except as above.  PHYSICAL EXAMINATION  VITAL SIGNS:                  Height 5 feet 4.5 inches, weight 184 pounds, blood pressure 130/78.  NECK:                          Without thyromegaly.  LUNGS:                        Clear to auscultation.  HEART:                        Regular rate and rhythm.  BACK:  Without CVA tenderness.  BREASTS:                      Without mass, retractions, or discharge.  Status post augmentation bilaterally.  ABDOMEN:                      Soft, nontender.  PELVIC:                       Cervix closed, thick.  Uterus approximately 12 weeks size.  Adnexa:  Nontender without palpable masses.  EXTREMITIES:                  Grossly within normal limits.  NEUROLOGIC:                   Grossly within normal limits.  LABORATORIES:                 Blood type O+.  Rh antibody screen negative. Sickle trait screen negative.  RPR nonreactive.  Rubella titer immune. Hepatitis B surface antigen negative.  HIV nonreactive.  ASSESSMENT:                   Intrauterine pregnancy at approximately 12 weeks estimated gestational age with incompetent cervix.  PLAN:                         McDonald cervical cerclage. Dictated by:   Guy Sandifer Arleta Creek, M.D. Attending:  Guy Sandifer Arleta Creek, M.D. DD:  05/15/01 TD:  05/15/01 Job: [id number]

## 2012-01-26 ENCOUNTER — Encounter: Payer: Self-pay | Admitting: Internal Medicine

## 2012-03-09 ENCOUNTER — Encounter (HOSPITAL_BASED_OUTPATIENT_CLINIC_OR_DEPARTMENT_OTHER): Payer: Self-pay | Admitting: Emergency Medicine

## 2012-03-09 ENCOUNTER — Emergency Department (HOSPITAL_BASED_OUTPATIENT_CLINIC_OR_DEPARTMENT_OTHER): Payer: Federal, State, Local not specified - PPO

## 2012-03-09 ENCOUNTER — Emergency Department (HOSPITAL_BASED_OUTPATIENT_CLINIC_OR_DEPARTMENT_OTHER)
Admission: EM | Admit: 2012-03-09 | Discharge: 2012-03-09 | Disposition: A | Discharge status: Home / Self Care | Payer: Federal, State, Local not specified - PPO | Attending: Emergency Medicine | Admitting: Emergency Medicine

## 2012-03-09 DIAGNOSIS — R319 Hematuria, unspecified: Secondary | ICD-10-CM

## 2012-03-09 DIAGNOSIS — F329 Major depressive disorder, single episode, unspecified: Secondary | ICD-10-CM | POA: Insufficient documentation

## 2012-03-09 DIAGNOSIS — E7800 Pure hypercholesterolemia, unspecified: Secondary | ICD-10-CM | POA: Insufficient documentation

## 2012-03-09 DIAGNOSIS — R109 Unspecified abdominal pain: Secondary | ICD-10-CM | POA: Insufficient documentation

## 2012-03-09 DIAGNOSIS — F3289 Other specified depressive episodes: Secondary | ICD-10-CM | POA: Insufficient documentation

## 2012-03-09 DIAGNOSIS — I10 Essential (primary) hypertension: Secondary | ICD-10-CM | POA: Insufficient documentation

## 2012-03-09 DIAGNOSIS — K5900 Constipation, unspecified: Secondary | ICD-10-CM | POA: Insufficient documentation

## 2012-03-09 DIAGNOSIS — R112 Nausea with vomiting, unspecified: Secondary | ICD-10-CM | POA: Insufficient documentation

## 2012-03-09 DIAGNOSIS — Z79899 Other long term (current) drug therapy: Secondary | ICD-10-CM | POA: Insufficient documentation

## 2012-03-09 DIAGNOSIS — Z8719 Personal history of other diseases of the digestive system: Secondary | ICD-10-CM | POA: Insufficient documentation

## 2012-03-09 HISTORY — DX: Essential (primary) hypertension: I10

## 2012-03-09 HISTORY — DX: Depression, unspecified: F32.A

## 2012-03-09 HISTORY — DX: Sleep disorder, unspecified: G47.9

## 2012-03-09 HISTORY — DX: Major depressive disorder, single episode, unspecified: F32.9

## 2012-03-09 HISTORY — DX: Pure hypercholesterolemia, unspecified: E78.00

## 2012-03-09 HISTORY — DX: Unspecified ectopic pregnancy without intrauterine pregnancy: O00.90

## 2012-03-09 HISTORY — DX: Unspecified intestinal obstruction, unspecified as to partial versus complete obstruction: K56.609

## 2012-03-09 LAB — URINALYSIS, ROUTINE W REFLEX MICROSCOPIC
Nitrite: NEGATIVE
Specific Gravity, Urine: 1.022 (ref 1.005–1.030)
Urobilinogen, UA: 0.2 mg/dL (ref 0.0–1.0)

## 2012-03-09 LAB — CBC WITH DIFFERENTIAL/PLATELET
Basophils Absolute: 0 10*3/uL (ref 0.0–0.1)
Eosinophils Relative: 1 % (ref 0–5)
HCT: 37.2 % (ref 36.0–46.0)
Lymphocytes Relative: 6 % — ABNORMAL LOW (ref 12–46)
MCH: 25.4 pg — ABNORMAL LOW (ref 26.0–34.0)
MCHC: 32.5 g/dL (ref 30.0–36.0)
MCV: 78 fL (ref 78.0–100.0)
Neutro Abs: 10.9 10*3/uL — ABNORMAL HIGH (ref 1.7–7.7)
Neutrophils Relative %: 90 % — ABNORMAL HIGH (ref 43–77)
Platelets: 277 10*3/uL (ref 150–400)
RBC: 4.77 MIL/uL (ref 3.87–5.11)
RDW: 14.4 % (ref 11.5–15.5)
WBC: 12.2 10*3/uL — ABNORMAL HIGH (ref 4.0–10.5)

## 2012-03-09 LAB — URINE MICROSCOPIC-ADD ON

## 2012-03-09 LAB — COMPREHENSIVE METABOLIC PANEL
ALT: 12 U/L (ref 0–35)
AST: 15 U/L (ref 0–37)
Alkaline Phosphatase: 74 U/L (ref 39–117)
CO2: 25 mEq/L (ref 19–32)
Calcium: 9.3 mg/dL (ref 8.4–10.5)
Chloride: 102 mEq/L (ref 96–112)
GFR calc Af Amer: 85 mL/min — ABNORMAL LOW (ref >90)
GFR calc non Af Amer: 73 mL/min — ABNORMAL LOW (ref >90)
Potassium: 4.1 mEq/L (ref 3.5–5.1)
Sodium: 138 mEq/L (ref 135–145)
Total Bilirubin: 0.4 mg/dL (ref 0.3–1.2)

## 2012-03-09 MED ORDER — MORPHINE SULFATE 4 MG/ML IJ SOLN
INTRAMUSCULAR | Status: AC
  Administered 2012-03-09: 4 mg via INTRAVENOUS
  Filled 2012-03-09: qty 1

## 2012-03-09 MED ORDER — MORPHINE SULFATE 4 MG/ML IJ SOLN
4.0000 mg | Freq: Once | INTRAMUSCULAR | Status: AC
  Administered 2012-03-09: 4 mg via INTRAVENOUS

## 2012-03-09 MED ORDER — ONDANSETRON HCL 4 MG/2ML IJ SOLN
4.0000 mg | Freq: Once | INTRAMUSCULAR | Status: AC
  Administered 2012-03-09: 4 mg via INTRAVENOUS
  Filled 2012-03-09: qty 2

## 2012-03-09 MED ORDER — IOHEXOL 300 MG/ML  SOLN
100.0000 mL | Freq: Once | INTRAMUSCULAR | Status: AC | PRN
  Administered 2012-03-09: 100 mL via INTRAVENOUS

## 2012-03-09 MED ORDER — IOHEXOL 300 MG/ML  SOLN
50.0000 mL | Freq: Once | INTRAMUSCULAR | Status: AC | PRN
  Administered 2012-03-09: 50 mL via ORAL

## 2012-03-09 MED ORDER — ONDANSETRON HCL 4 MG PO TABS: 4.0000 mg | ORAL_TABLET | Freq: Four times a day (QID) | ORAL | Status: DC

## 2012-03-09 MED ORDER — SODIUM CHLORIDE 0.9 % IV BOLUS (SEPSIS)
1000.0000 mL | Freq: Once | INTRAVENOUS | Status: AC
  Administered 2012-03-09: 1000 mL via INTRAVENOUS

## 2012-03-09 MED ORDER — POLYETHYLENE GLYCOL 3350 17 G PO PACK: 17.0000 g | PACK | Freq: Every day | ORAL | Status: DC

## 2012-03-09 NOTE — ED Provider Notes (Signed)
History     CSN: [id number]  Arrival date & time 03/09/12  1102   First MD Initiated Contact with Patient 03/09/12 1128      Chief Complaint  Patient presents with  . Abdominal Pain  . Emesis  . Constipation    (Consider location/radiation/quality/duration/timing/severity/associated sxs/prior treatment) HPI Comments: Patient with a history of 3 small bowel obstructions in the past presents with abdominal pain that's been worsening since yesterday. She describes as a crampy pain in her midabdomen. Otherwise nonradiating. It feels similar to her past small bowel obstructions. She's had some nausea and vomiting associated with it. She did have a small hard bowel movement this morning but is currently not passing gas. She has a history of abdominal hysterectomy and ectopic pregnancy surgery as well as an exploratory laparotomy. She has a known history of abdominal adhesions. She denies any fevers or chills. Denies any blood in her stool or emesis.   Patient is a 51 y.o. female presenting with abdominal pain, vomiting, and constipation.  Abdominal Pain The primary symptoms of the illness include abdominal pain, nausea and vomiting. The primary symptoms of the illness do not include fever, fatigue, shortness of breath or diarrhea.  Additional symptoms associated with the illness include constipation. Symptoms associated with the illness do not include chills, diaphoresis, hematuria, frequency or back pain.  Emesis  Associated symptoms include abdominal pain. Pertinent negatives include no arthralgias, no chills, no cough, no diarrhea, no fever and no headaches.  Constipation  Associated symptoms include abdominal pain, nausea and vomiting. Pertinent negatives include no fever, no diarrhea, no hematuria, no chest pain, no headaches, no coughing and no rash.    Past Medical History  Diagnosis Date  . Bowel obstruction   . Ectopic pregnancy   . Hypertension   . Hypercholesteremia   .  Depression   . Sleep disorder     Past Surgical History  Procedure Date  . Abdominal hysterectomy   . Ectopic pregnancy surgery   . Exploratory laparotomy     No family history on file.  History  Substance Use Topics  . Smoking status: Not on file  . Smokeless tobacco: Not on file  . Alcohol Use:     OB History    Grav Para Term Preterm Abortions TAB SAB Ect Mult Living                  Review of Systems  Constitutional: Negative for fever, chills, diaphoresis and fatigue.  HENT: Negative for congestion, rhinorrhea and sneezing.   Eyes: Negative.   Respiratory: Negative for cough, chest tightness and shortness of breath.   Cardiovascular: Negative for chest pain and leg swelling.  Gastrointestinal: Positive for nausea, vomiting, abdominal pain and constipation. Negative for diarrhea and blood in stool.  Genitourinary: Negative for frequency, hematuria, flank pain and difficulty urinating.  Musculoskeletal: Negative for back pain and arthralgias.  Skin: Negative for rash.  Neurological: Negative for dizziness, speech difficulty, weakness, numbness and headaches.    Allergies  Amoxicillin; Tetracyclines & related; and Wellbutrin  Home Medications   Current Outpatient Rx  Name  Route  Sig  Dispense  Refill  . CLONAZEPAM 0.5 MG PO TABS   Oral   Take 0.5 mg by mouth at bedtime as needed.         Marland Kitchen LOSARTAN POTASSIUM 50 MG PO TABS   Oral   Take 50 mg by mouth daily.         Marland Kitchen SIMVASTATIN 40 MG  PO TABS   Oral   Take 40 mg by mouth every evening.         . VENLAFAXINE HCL ER 75 MG PO CP24   Oral   Take 75 mg by mouth daily.         Marland Kitchen POLYETHYLENE GLYCOL 3350 PO PACK   Oral   Take 17 g by mouth daily.   14 each   0     BP 134/85  Pulse 82  Temp 98.5 F (36.9 C) (Oral)  Resp 18  SpO2 99%  Physical Exam  Constitutional: She is oriented to person, place, and time. She appears well-developed and well-nourished.  HENT:  Head: Normocephalic and  atraumatic.  Eyes: Pupils are equal, round, and reactive to light.  Neck: Normal range of motion. Neck supple.  Cardiovascular: Normal rate, regular rhythm and normal heart sounds.   Pulmonary/Chest: Effort normal and breath sounds normal. No respiratory distress. She has no wheezes. She has no rales. She exhibits no tenderness.  Abdominal: Soft. Bowel sounds are normal. There is tenderness (moderate tenderness across the mid abdomen.). There is no rebound and no guarding.  Musculoskeletal: Normal range of motion. She exhibits no edema.  Lymphadenopathy:    She has no cervical adenopathy.  Neurological: She is alert and oriented to person, place, and time.  Skin: Skin is warm and dry. No rash noted.  Psychiatric: She has a normal mood and affect.    ED Course  Procedures (including critical care time)  Results for orders placed during the hospital encounter of 03/09/12  CBC WITH DIFFERENTIAL      Component Value Range   WBC 12.2 (*) 4.0 - 10.5 K/uL   RBC 4.77  3.87 - 5.11 MIL/uL   Hemoglobin 12.1  12.0 - 15.0 g/dL   HCT 11.9  14.7 - 82.9 %   MCV 78.0  78.0 - 100.0 fL   MCH 25.4 (*) 26.0 - 34.0 pg   MCHC 32.5  30.0 - 36.0 g/dL   RDW 56.2  13.0 - 86.5 %   Platelets 277  150 - 400 K/uL   Neutrophils Relative 90 (*) 43 - 77 %   Neutro Abs 10.9 (*) 1.7 - 7.7 K/uL   Lymphocytes Relative 6 (*) 12 - 46 %   Lymphs Abs 0.7  0.7 - 4.0 K/uL   Monocytes Relative 3  3 - 12 %   Monocytes Absolute 0.4  0.1 - 1.0 K/uL   Eosinophils Relative 1  0 - 5 %   Eosinophils Absolute 0.2  0.0 - 0.7 K/uL   Basophils Relative 0  0 - 1 %   Basophils Absolute 0.0  0.0 - 0.1 K/uL  COMPREHENSIVE METABOLIC PANEL      Component Value Range   Sodium 138  135 - 145 mEq/L   Potassium 4.1  3.5 - 5.1 mEq/L   Chloride 102  96 - 112 mEq/L   CO2 25  19 - 32 mEq/L   Glucose, Bld 102 (*) 70 - 99 mg/dL   BUN 17  6 - 23 mg/dL   Creatinine, Ser 7.84  0.50 - 1.10 mg/dL   Calcium 9.3  8.4 - 69.6 mg/dL   Total Protein  7.7  6.0 - 8.3 g/dL   Albumin 3.6  3.5 - 5.2 g/dL   AST 15  0 - 37 U/L   ALT 12  0 - 35 U/L   Alkaline Phosphatase 74  39 - 117 U/L   Total Bilirubin  0.4  0.3 - 1.2 mg/dL   GFR calc non Af Amer 73 (*) >90 mL/min   GFR calc Af Amer 85 (*) >90 mL/min  URINALYSIS, ROUTINE W REFLEX MICROSCOPIC      Component Value Range   Color, Urine YELLOW  YELLOW   APPearance CLEAR  CLEAR   Specific Gravity, Urine 1.022  1.005 - 1.030   pH 6.0  5.0 - 8.0   Glucose, UA NEGATIVE  NEGATIVE mg/dL   Hgb urine dipstick LARGE (*) NEGATIVE   Bilirubin Urine NEGATIVE  NEGATIVE   Ketones, ur NEGATIVE  NEGATIVE mg/dL   Protein, ur NEGATIVE  NEGATIVE mg/dL   Urobilinogen, UA 0.2  0.0 - 1.0 mg/dL   Nitrite NEGATIVE  NEGATIVE   Leukocytes, UA NEGATIVE  NEGATIVE  URINE MICROSCOPIC-ADD ON      Component Value Range   Squamous Epithelial / LPF RARE  RARE   WBC, UA 0-2  <3 WBC/hpf   RBC / HPF TOO NUMEROUS TO COUNT  <3 RBC/hpf   Bacteria, UA MANY (*) RARE   Urine-Other FEW YEAST     Ct Abdomen Pelvis W Contrast  03/09/2012  *RADIOLOGY REPORT*  Clinical Data: Abdominal pain and history of bowel obstruction.  CT ABDOMEN AND PELVIS WITH CONTRAST  Technique:  Multidetector CT imaging of the abdomen and pelvis was performed following the standard protocol during bolus administration of intravenous contrast.  Contrast: OMNIPAQUE IOHEXOL 300 MG/ML  SOLN, 50mL OMNIPAQUE IOHEXOL 300 MG/ML  SOLN  Comparison: Acute abdominal series 11/30/2013and CT abdomen pelvis 06/10/2010  Findings: The lung bases are clear.  There is no pleural effusion. There is nonmass-like hypoattenuation in the liver adjacent to the falciform ligament.  Although this is more discretely visualized on today's CT, there was a similar appearance of slight low density in this region on prior CT of 2012.  Findings are favored to be due to benign focal fatty infiltration.  The remainder of the liver enhances homogeneously.  There is no biliary ductal  dilatation. The gallbladder, spleen, kidneys, and pancreas are within normal limits.  Stomach is decompressed.  Oral contrast has progressed through the bowel loops and is at the level of the transverse colon at the time imaging.  There are no dilated loops of bowel to suggest obstruction.  There is a fairly prominent amount of stool in the distal colon, for which constipation cannot be excluded.  The appendix is normal.  There are no mesenteric inflammatory changes. Negative for lymphadenopathy, ascites, or free air.  The patient is status post hysterectomy.  Urinary bladder appears normal.  No adnexal mass.  Inguinal regions are unremarkable.  Negative for abdominal wall hernia.  Abdominal aorta is normal in caliber.  No acute or suspicious bony abnormality identified.  IMPRESSION: 1. No acute findings in the abdomen or pelvis.  Specifically, there is no evidence of bowel obstruction or bowel inflammation. 2.  Prominent amount of stool in the distal colon.  Question if the patient could be constipated. 3.  Normal appendix. 4.  Benign appearing focal fatty infiltration in the liver adjacent to the falciform ligament.   Original Report Authenticated By: Britta Mccreedy, M.D.    Dg Abd Acute W/chest  03/09/2012  *RADIOLOGY REPORT*  Clinical Data: Abdominal pain history of small bowel obstruction.  ACUTE ABDOMEN SERIES (ABDOMEN 2 VIEW & CHEST 1 VIEW)  Comparison: Acute abdominal series 06/09/2010 and CT abdomen pelvis 06/09/2010.  Findings: The heart, mediastinal, and hilar contours are within normal limits.  Trachea is midline and the lungs are clear.  No visible pleural effusion.  No evidence of free intraperitoneal air.  The stomach appears nondistended and contains an air-fluid level.  There are a few nonspecific air-fluid levels in the upper to mid abdomen on the upright view.  No dilated bowel loops are appreciated.  No acute bony abnormality.   There are a few small calcifications in the pelvis bilaterally  these are favored to be phleboliths and vascular calcifications, compared to prior CT.  IMPRESSION:  1.  No visible dilated loops of bowel to suggest a bowel obstruction. 2.  There are a few nonspecific air-fluid levels within bowel loops on the upright view. If there is continued clinical suspicion for developing small bowel obstruction, consider follow-up abdominal radiographs, or CT of the abdomen pelvis, as indicated. 3.  No acute cardiopulmonary disease.   Original Report Authenticated By: Britta Mccreedy, M.D.       1. Abdominal pain   2. Hematuria       MDM  Pt feeling much better.  abd pain improved.  No evidence of obstruction on CT.  Since pain improved, feel that pt can go home.  Will start on miralax for the constipation.  Advised pt to f/u in 2 days with her PMD for a recheck or return here if symptoms worsen over the weekend.  Advised she will need to have her PMD recheck her urine to make sure that blood clears        Rolan Bucco, MD 03/09/12 1616

## 2012-03-09 NOTE — ED Notes (Signed)
Pt having N/V and abdominal pain for last 8 hrs.  Pt states she has had obstructions in the past.  Last BM  Today but was very small and hard.  No known fever.

## 2012-03-11 ENCOUNTER — Telehealth: Payer: Self-pay | Admitting: *Deleted

## 2012-03-11 ENCOUNTER — Ambulatory Visit (AMBULATORY_SURGERY_CENTER): Payer: Federal, State, Local not specified - PPO | Admitting: *Deleted

## 2012-03-11 VITALS — Ht 62.0 in | Wt 172.0 lb

## 2012-03-11 DIAGNOSIS — Z1211 Encounter for screening for malignant neoplasm of colon: Secondary | ICD-10-CM

## 2012-03-11 DIAGNOSIS — R109 Unspecified abdominal pain: Secondary | ICD-10-CM

## 2012-03-11 MED ORDER — MOVIPREP 100 G PO SOLR: ORAL | Status: DC

## 2012-03-11 NOTE — Telephone Encounter (Signed)
Noted. Thanks, notified patient.

## 2012-03-11 NOTE — Progress Notes (Signed)
Notified Dr.Patterson of patient visit to ED this Sat. And Dr.Belfi wants patient to have EGD with colonoscopy. Dr.Patterson said okay. Added EGD to scheduled colon. Patient notified also. [name]

## 2012-03-11 NOTE — Telephone Encounter (Signed)
yes

## 2012-03-11 NOTE — Telephone Encounter (Signed)
Patient Direct here for previsit today for her Screening Colonoscopy. She had went to ED on Sat. 03-09-12 with abdominal pain,emesis and constipation, possible bowel obstruction. She said the ED doctor Dr.Belfi told her that she needs EGD when she has her scheduled colonoscopy. Is it okay for me to add EGD?

## 2012-03-13 ENCOUNTER — Encounter: Payer: Self-pay | Admitting: Gastroenterology

## 2012-03-25 ENCOUNTER — Encounter: Payer: Federal, State, Local not specified - PPO | Admitting: Gastroenterology

## 2012-03-27 ENCOUNTER — Encounter: Payer: Federal, State, Local not specified - PPO | Admitting: Internal Medicine

## 2012-03-29 ENCOUNTER — Ambulatory Visit (AMBULATORY_SURGERY_CENTER): Payer: Federal, State, Local not specified - PPO | Admitting: Gastroenterology

## 2012-03-29 ENCOUNTER — Other Ambulatory Visit: Payer: Self-pay | Admitting: *Deleted

## 2012-03-29 ENCOUNTER — Encounter: Payer: Self-pay | Admitting: Gastroenterology

## 2012-03-29 VITALS — BP 131/84 | HR 70 | Temp 99.1°F | Resp 12 | Ht 62.0 in | Wt 172.0 lb

## 2012-03-29 DIAGNOSIS — R109 Unspecified abdominal pain: Secondary | ICD-10-CM

## 2012-03-29 DIAGNOSIS — Z1211 Encounter for screening for malignant neoplasm of colon: Secondary | ICD-10-CM

## 2012-03-29 MED ORDER — SODIUM CHLORIDE 0.9 % IV SOLN: 500.0000 mL | INTRAVENOUS | Status: DC

## 2012-03-29 MED ORDER — HYOSCYAMINE SULFATE 0.125 MG SL SUBL: 0.1250 mg | SUBLINGUAL_TABLET | SUBLINGUAL | Status: DC | PRN

## 2012-03-29 NOTE — Progress Notes (Signed)
Patient did not experience any of the following events: a burn prior to discharge; a fall within the facility; wrong site/side/patient/procedure/implant event; or a hospital transfer or hospital admission upon discharge from the facility. (G8907) Patient did not have preoperative order for IV antibiotic SSI prophylaxis. (G8918)  

## 2012-03-29 NOTE — Op Note (Signed)
[facility] [address] [place], [address]   COLONOSCOPY PROCEDURE REPORT  PATIENT: [name], [name]  MR#: [id number] BIRTHDATE: May 25, 1960 , 51  yrs. old GENDER: Female ENDOSCOPIST: Mardella Layman, MD, [facility] REFERRED BY: PROCEDURE DATE:  03/29/2012 PROCEDURE:   Colonoscopy, screening ASA CLASS:   Class II INDICATIONS:Average risk patient for colon cancer and Abdominal pain. MEDICATIONS: propofol (Diprivan) 150mg  IV  DESCRIPTION OF PROCEDURE:   After the risks and benefits and of the procedure were explained, informed consent was obtained.  A digital rectal exam revealed no abnormalities of the rectum.    The LB CF-H180AL [id number]  endoscope was introduced through the anus and advanced to the cecum, which was identified by both the appendix and ileocecal valve .  The quality of the prep was excellent, using MoviPrep .  The instrument was then slowly withdrawn as the colon was fully examined.     COLON FINDINGS: A normal appearing cecum, ileocecal valve, and appendiceal orifice were identified.  The ascending, hepatic flexure, transverse, splenic flexure, descending, sigmoid colon and rectum appeared unremarkable.  No polyps or cancers were seen. Retroflexed views revealed no abnormalities.     The scope was then withdrawn from the patient and the procedure completed.  COMPLICATIONS: There were no complications. ENDOSCOPIC IMPRESSION: Normal colon .Marland Kitchen..no polyps or cancer or IBD...  RECOMMENDATIONS: 1.  Upper endoscopy will be scheduled 2.  Continue current colorectal screening recommendations for "routine risk" patients with a repeat colonoscopy in 10 years.   REPEAT EXAM:  cc:  _______________________________ eSignedMardella Layman, MD, [facility] 03/29/2012 3:56 PM

## 2012-03-29 NOTE — Op Note (Signed)
[facility] [address] [place], [address]   ENDOSCOPY PROCEDURE REPORT  PATIENT: Charlene, Oliver  MR#: [id number] BIRTHDATE: 1960/12/11 , 51  yrs. old GENDER: Female ENDOSCOPIST:David Hale Bogus, MD, [facility] REFERRED BY: PROCEDURE DATE:  03/29/2012 PROCEDURE:   EGD w/ biopsy for H.pylori ASA CLASS:    Class II INDICATIONS: periumbilical abdominal pain. MEDICATION: There was residual sedation effect present from prior procedure, Robinul 0.2 mg IV, and propofol (Diprivan) 100mg  IV TOPICAL ANESTHETIC:   Cetacaine Spray  DESCRIPTION OF PROCEDURE:   After the risks and benefits of the procedure were explained, informed consent was obtained.  The [facility] GIF-H180 [id number]  endoscope was introduced through the mouth  and advanced to the second portion of the duodenum .  The instrument was slowly withdrawn as the mucosa was fully examined.    The upper, middle and distal third of the esophagus were carefully inspected and no abnormalities were noted.  The z-line was well seen at the GEJ.  The endoscope was pushed into the fundus which was normal including a retroflexed view.  The antrum, gastric body, first and second part of the duodenum were unremarkable. Retroflexed views revealed no abnormalities.    The scope was then withdrawn from the patient and the procedure completed.  COMPLICATIONS: There were no complications.   ENDOSCOPIC IMPRESSION: Normal EGD .Marland Kitchen.?? IBS...  RECOMMENDATIONS: 1.  Await biopsy results 2.  Follow-up o.v first available.Marland KitchenMarland KitchenConsider small bowel pill camera exam 3.prn SL Levsin 0.125 mg q6h prn #35...  ___________________________ eSignedMardella Layman, MD, [facility] 03/29/2012 4:07 PM   standard discharge

## 2012-03-29 NOTE — Patient Instructions (Addendum)
Discharge instructions given with verbal understanding. Follow up appointment on April 23, 2012 at 1;30 to see Dr. Jarold Motto. Resume previous medications. YOU HAD AN ENDOSCOPIC PROCEDURE TODAY AT THE [facility]: Refer to the procedure report that was given to you for any specific questions about what was found during the examination.  If the procedure report does not answer your questions, please call your gastroenterologist to clarify.  If you requested that your care partner not be given the details of your procedure findings, then the procedure report has been included in a sealed envelope for you to review at your convenience later.  YOU SHOULD EXPECT: Some feelings of bloating in the abdomen. Passage of more gas than usual.  Walking can help get rid of the air that was put into your GI tract during the procedure and reduce the bloating. If you had a lower endoscopy (such as a colonoscopy or flexible sigmoidoscopy) you may notice spotting of blood in your stool or on the toilet paper. If you underwent a bowel prep for your procedure, then you may not have a normal bowel movement for a few days.  DIET: Your first meal following the procedure should be a light meal and then it is ok to progress to your normal diet.  A half-sandwich or bowl of soup is an example of a good first meal.  Heavy or fried foods are harder to digest and may make you feel nauseous or bloated.  Likewise meals heavy in dairy and vegetables can cause extra gas to form and this can also increase the bloating.  Drink plenty of fluids but you should avoid alcoholic beverages for 24 hours.  ACTIVITY: Your care partner should take you home directly after the procedure.  You should plan to take it easy, moving slowly for the rest of the day.  You can resume normal activity the day after the procedure however you should NOT DRIVE or use heavy machinery for 24 hours (because of the sedation medicines used during the test).     SYMPTOMS TO REPORT IMMEDIATELY: A gastroenterologist can be reached at any hour.  During normal business hours, 8:30 AM to 5:00 PM Monday through Friday, call [phone].  After hours and on weekends, please call the GI answering service at [phone] who will take a message and have the physician on call contact you.   Following lower endoscopy (colonoscopy or flexible sigmoidoscopy):  Excessive amounts of blood in the stool  Significant tenderness or worsening of abdominal pains  Swelling of the abdomen that is new, acute  Fever of 100F or higher  Following upper endoscopy (EGD)  Vomiting of blood or coffee ground material  New chest pain or pain under the shoulder blades  Painful or persistently difficult swallowing  New shortness of breath  Fever of 100F or higher  Black, tarry-looking stools  FOLLOW UP: If any biopsies were taken you will be contacted by phone or by letter within the next 1-3 weeks.  Call your gastroenterologist if you have not heard about the biopsies in 3 weeks.  Our staff will call the home number listed on your records the next business day following your procedure to check on you and address any questions or concerns that you may have at that time regarding the information given to you following your procedure. This is a courtesy call and so if there is no answer at the home number and we have not heard from you through the emergency physician  on call, we will assume that you have returned to your regular daily activities without incident.  SIGNATURES/CONFIDENTIALITY: You and/or your care partner have signed paperwork which will be entered into your electronic medical record.  These signatures attest to the fact that that the information above on your After Visit Summary has been reviewed and is understood.  Full responsibility of the confidentiality of this discharge information lies with you and/or your care-partner.

## 2012-04-01 ENCOUNTER — Telehealth: Payer: Self-pay | Admitting: *Deleted

## 2012-04-01 ENCOUNTER — Telehealth: Payer: Self-pay

## 2012-04-01 NOTE — Telephone Encounter (Signed)
Patient had ECL with Dr.Patterson on Friday, 03-29-12. She called back with epigastric pain, lower back pain, abdomen bloated and tight. She is only eating small meals, pt. Denies any nausea,vomiting,fever,bleeding, or BM. She has passed small amt. Of gas on Friday,but none since then. Dr.Patterson did order her Levisin prn but patient has not picked this up yet. Please advise, thanks.

## 2012-04-01 NOTE — Telephone Encounter (Signed)
Spoke with patient, she feels her pain is NOT procedure related. She feels it may just be from the clean out, she did state it was not the same pain like she had with her ED visit with bowel obstruction 2 weeks ago. She state she has the discomfort when she eats food. Patient wants to try the Levisin and follow up with Dr.Patterson. Encouraged patient to call us back at any time to see our PA if needed, she understands.

## 2012-04-01 NOTE — Telephone Encounter (Signed)
If this is the same or similar pain that she had prior to her procedures, then she should take Levsin as prescribed and followup with Dr. Jarold Motto. If this is a new pain that she feels is procedure related, she should see Amy in the office today.

## 2012-04-01 NOTE — Telephone Encounter (Signed)
Left message on answering machine. 

## 2012-04-09 ENCOUNTER — Encounter: Payer: Self-pay | Admitting: Gastroenterology

## 2012-04-23 ENCOUNTER — Ambulatory Visit: Payer: Federal, State, Local not specified - PPO | Admitting: Gastroenterology

## 2012-05-10 ENCOUNTER — Ambulatory Visit (INDEPENDENT_AMBULATORY_CARE_PROVIDER_SITE_OTHER): Payer: Federal, State, Local not specified - PPO | Admitting: Gastroenterology

## 2012-05-10 ENCOUNTER — Encounter: Payer: Self-pay | Admitting: Gastroenterology

## 2012-05-10 VITALS — BP 110/70 | HR 86 | Ht 62.5 in | Wt 171.2 lb

## 2012-05-10 DIAGNOSIS — R142 Eructation: Secondary | ICD-10-CM

## 2012-05-10 DIAGNOSIS — G8929 Other chronic pain: Secondary | ICD-10-CM

## 2012-05-10 DIAGNOSIS — K660 Peritoneal adhesions (postprocedural) (postinfection): Secondary | ICD-10-CM

## 2012-05-10 DIAGNOSIS — R109 Unspecified abdominal pain: Secondary | ICD-10-CM

## 2012-05-10 DIAGNOSIS — R141 Gas pain: Secondary | ICD-10-CM

## 2012-05-10 NOTE — Progress Notes (Signed)
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  She was recently in the emergency room with abdominal pain consistent with partial small bowel obstruction.  She responded quickly to medical management, and underwent endoscopy and colonoscopy several weeks ago, both which were unremarkable.  Her main complaint today is abdominal gas and excessive flatus over the last 6 months.  Her bowels seem to move normally, and she denies current nausea and vomiting, reflux symptoms, or other gastrointestinal problems.  Review of her labs shows no abnormalities.  CT scan of the abdomen did show focal fatty infiltration of the liver.  She is followed by Dr. Chevis Pretty gynecology.  Family history is noncontributory.  I have reviewed this patient's present history, medical and surgical past history, allergies and medications.     ROS:   All systems were reviewed and are negative unless otherwise stated in the HPI.    Physical Exam: Blood pressure 110/70, pulse 86 and regular, and weight 171 with a BMI of 30.82. General well developed well nourished patient in no acute distress, appearing their stated age Eyes PERRLA, no icterus, fundoscopic exam per opthamologist Heart no significant murmurs, gallops or rubs noted Abdomen no hepatosplenomegaly masses or tenderness, BS normal.  Extremities no acute joint lesions, edema, phlebitis or evidence of cellulitis. Neurologic patient oriented x 3, cranial nerves intact, no focal neurologic deficits noted. Psychological mental status normal and normal affect.  Assessment and plan: Intestinal adhesions with recurrent abdominal pain and partial small bowel obstruction.  She currently has problems with intestinal gas, and I have placed her on a" low gas diet" with a trial of Align daily probiotics.  She  may need Xifaxan therapy.  I have asked her to avoid nonabsorbable carbohydrates such as sorbitol or fructose..  She does have an element of lactose intolerance, and I have advised avoidance of major lactose products and when necessary Lactaid use.  We will see her as needed in followup.  No diagnosis found.

## 2012-05-10 NOTE — Patient Instructions (Signed)
Please follow Low Gas Diet Given today.  Please follow Artificial Sweeteners sheet given today.    Please purchase Align over the counter. This puts good bacteria back into your colon. You should take 1 capsule by mouth once daily for a month.  Coupons given today for [employer].  Cc: Dr Lajean Manes

## 2012-10-22 ENCOUNTER — Ambulatory Visit (INDEPENDENT_AMBULATORY_CARE_PROVIDER_SITE_OTHER): Payer: Federal, State, Local not specified - PPO | Admitting: Gastroenterology

## 2012-10-22 ENCOUNTER — Encounter: Payer: Self-pay | Admitting: Gastroenterology

## 2012-10-22 ENCOUNTER — Telehealth: Payer: Self-pay | Admitting: *Deleted

## 2012-10-22 VITALS — BP 138/74 | HR 98 | Ht 62.5 in | Wt 181.8 lb

## 2012-10-22 DIAGNOSIS — K589 Irritable bowel syndrome without diarrhea: Secondary | ICD-10-CM

## 2012-10-22 DIAGNOSIS — E739 Lactose intolerance, unspecified: Secondary | ICD-10-CM

## 2012-10-22 DIAGNOSIS — R140 Abdominal distension (gaseous): Secondary | ICD-10-CM

## 2012-10-22 DIAGNOSIS — R109 Unspecified abdominal pain: Secondary | ICD-10-CM

## 2012-10-22 MED ORDER — HYOSCYAMINE SULFATE 0.125 MG SL SUBL: 0.1250 mg | SUBLINGUAL_TABLET | SUBLINGUAL | Status: DC | PRN

## 2012-10-22 NOTE — Telephone Encounter (Signed)
Dr. Jarold Motto wanted patient to have Celiac panel but patient had already left(he forgot to tell me) Left message for patient to call back, lab order is in.

## 2012-10-22 NOTE — Progress Notes (Signed)
This is a 52 year old African American female who has had extensive pelvic surgery many years ago, and has had resultant problems with suspected intestinal adhesions.  She continues with gas and bloating and has what appears to be lactose intolerance.  CT enteroscopy in November showed no evidence of small bowel obstruction, there was fatty infiltration of the liver, and her gallbladder and pancreas appeared normal..  She is currently having regular bowel movements with a somewhat high fiber diet and liberal by mouth fluids.  She is concerned about periodic episodes of" hardness" of her abdomen with distention and bloating and excessive gas.  When necessary Levsin has helped her symptoms, and she relates that 2 months of probiotic seemed to make things worse.  She has no symptoms of malabsorption, melena or hematochezia.  She underwent colonoscopy in December which was unremarkable.  Endoscopy also was unremarkable and testing for H. pylori was negative.  Current Medications, Allergies, Past Medical History, Past Surgical History, Family History and Social History were reviewed in [employer] record.  ROS: All systems were reviewed and are negative unless otherwise stated in the HPI.          Physical Exam: Healthy-appearing patient in no distress.  Blood pressure 130/74, pulse 90 and regular and weight 181 with a BMI of 32.7.  I cannot appreciate stigmata of chronic liver disease.  Her abdomen shows exogenous obesity but no definite organomegaly, masses or tenderness.  Bowel sounds are nonobstructive in nature.  Mental status is normal.   Assessment and Plan: Chronic IBS manifested mostly by abdominal bloating.  It has not been proven that this patient has intestinal adhesions, but apparently she has been hospitalized in the past for small bowel obstruction.  She appear to be doing as good as I have seen her do an and sometime currently.  She does have lactose intolerance, and I  have referred her to dietary for a lactose-free diet.  I've asked her continue fiber as tolerated with when necessary sublingual Levsin.  She cannot afford Xifaxan trial therapy.  We may need referral to [facility] for hydrogen breath testing for bacterial overgrowth syndrome.  We'll order celiac serologies for review. Encounter Diagnosis  Name Primary?  . Lactose intolerance Yes

## 2012-10-22 NOTE — Patient Instructions (Addendum)
Information on Lactose diet is below for your review.   Number to schedule appointment with the Nutrition Department is [phone] They are open from 8 am to 6 pm  I called and left the Nutrition Department a message, if you do not hear from them by Friday, please call me.Tresa Endo)   _________________________________________________________________________________________________________________________________________________________________________________________________________ Lactose Intolerance, Adult Lactose intolerance is when the body is not able to digest lactose, a sugar found in milk and milk products. Lactose intolerance is caused by your body not producing enough of the enzyme lactase. When there is not enough lactase to digest the amount of lactose consumed, discomfort may be felt. Lactose intolerance is not a milk allergy. For most people, lactase deficiency is a condition that develops naturally over time. After about the age of 2, the body begins to produce less lactase. But many people may not experience symptoms until they are much older. CAUSES Things that can cause you to be lactose intolerant include:  Aging.  Being born without the ability to make lactase.  Certain digestive diseases.  Injuries to the small intestine. SYMPTOMS   Feeling sick to your stomach (nauseous).  Diarrhea.  Cramps.  Bloating.  Gas. Symptoms usually show up a half hour or 2 hours after eating or drinking products containing lactose. TREATMENT  No treatment can improve the body's ability to produce lactase. However, symptoms can be controlled through diet. A medicine may be given to you to take when you consume lactose-containing foods or drinks. The medicine contains the lactase enzyme, which help the body digest lactose better. HOME CARE INSTRUCTIONS  Eat or drink dairy products as told by your caregiver or dietician.  Take all medicine as directed by your caregiver.  Find  lactose-free or lactose-reduced products at your local grocery store.  Talk to your caregiver or dietician to decide if you need any dietary supplements. The following is the amount of calcium needed from the diet:  19 to 50 years: 1000 mg  Over 50 years: 1200 mg Calcium and Lactose in Common Foods Non-Dairy Products / Calcium Content (mg)  Calcium-fortified orange juice, 1 cup / 308 to 344 mg  Sardines, with edible bones, 3 oz / 270 mg  Salmon, canned, with edible bones, 3 oz / 205 mg  Soymilk, fortified, 1 cup / 200 mg  Broccoli (raw), 1 cup / 90 mg  Orange, 1 medium / 50 mg  Pinto beans,  cup / 40 mg  Tuna, canned, 3 oz / 10 mg  Lettuce greens,  cup / 10 mg Dairy Products / Calcium Content (mg) / Lactose Content (g)  Yogurt, plain, low-fat, 1 cup / 415 mg / 5 g  Milk, reduced fat, 1 cup / 295 mg / 11 g  Swiss cheese, 1 oz / 270 mg / 1 g  Ice cream,  cup / 85 mg / 6 g  Cottage cheese,  cup / 75 mg / 2 to 3 g SEEK MEDICAL CARE IF: You have no relief from your symptoms. Document Released: 03/27/2005 Document Revised: 06/19/2011 Document Reviewed: 06/24/2010 [facility] Patient Information 2014 [place], [place].

## 2012-10-23 ENCOUNTER — Telehealth: Payer: Self-pay | Admitting: *Deleted

## 2012-10-23 NOTE — Telephone Encounter (Signed)
Pt informed to come in for added labs, Celiac Panel was added after her visit. Pt stated understanding.

## 2013-06-02 ENCOUNTER — Telehealth: Payer: Self-pay | Admitting: Gastroenterology

## 2013-06-02 NOTE — Telephone Encounter (Signed)
Patient states she is having LUQ pain and abdominal distention. States it is continuous. She reports it has been going on since November 2014. Scheduled with Willette ClusterPaula Guenther, NP on 06/04/13 at 2:00 PM.

## 2013-06-04 ENCOUNTER — Other Ambulatory Visit (INDEPENDENT_AMBULATORY_CARE_PROVIDER_SITE_OTHER): Payer: Federal, State, Local not specified - PPO

## 2013-06-04 ENCOUNTER — Ambulatory Visit (INDEPENDENT_AMBULATORY_CARE_PROVIDER_SITE_OTHER): Payer: Federal, State, Local not specified - PPO | Admitting: Nurse Practitioner

## 2013-06-04 ENCOUNTER — Encounter: Payer: Self-pay | Admitting: Nurse Practitioner

## 2013-06-04 VITALS — BP 124/80 | HR 64 | Ht 62.5 in | Wt 190.6 lb

## 2013-06-04 DIAGNOSIS — R141 Gas pain: Secondary | ICD-10-CM

## 2013-06-04 DIAGNOSIS — R140 Abdominal distension (gaseous): Secondary | ICD-10-CM

## 2013-06-04 DIAGNOSIS — R109 Unspecified abdominal pain: Secondary | ICD-10-CM

## 2013-06-04 DIAGNOSIS — R143 Flatulence: Secondary | ICD-10-CM

## 2013-06-04 DIAGNOSIS — R142 Eructation: Secondary | ICD-10-CM

## 2013-06-04 LAB — IGA: IgA: 137 mg/dL (ref 68–378)

## 2013-06-04 MED ORDER — METRONIDAZOLE 250 MG PO TABS: 250.0000 mg | ORAL_TABLET | Freq: Three times a day (TID) | ORAL | Status: DC

## 2013-06-04 NOTE — Patient Instructions (Addendum)
Please go to the basement level to have your labs drawn.  We sent a prescription for Metronidazole 250 mg to [facility] [address].   We scheduled [facility] Ultrasound [facility]. [phone]. Go to registration inside the front door.  Monday 06-09-2013 at 8 AM.  Have nothing by mouth after midnight. . . Appointment with Dr. Leone PayorGessner on 07-24-2013 at 8:30 am .

## 2013-06-05 ENCOUNTER — Encounter: Payer: Self-pay | Admitting: Nurse Practitioner

## 2013-06-05 DIAGNOSIS — R140 Abdominal distension (gaseous): Secondary | ICD-10-CM | POA: Insufficient documentation

## 2013-06-05 NOTE — Progress Notes (Signed)
     History of Present Illness:  Patient is a 53 year old female known to Dr. Jarold MottoPatterson. She has been followed by us for abdominal pain and bloating. Some of her symptoms have been attributed to adhesive disease related to abdominal surgeries. There has also been a question of lactose intolerance as well as smaill intestinal bacterial overgrowth.   Camy comes in today for evaluation of LUQ pain and abdominal distention present for a couple of months. Her abdomen "feels hard". Bowel movements are okay and no nausea or vomiting.   Current Medications, Allergies, Past Medical History, Past Surgical History, Family History and Social History were reviewed in [facility] electronic medical record.  Physical Exam: General: Pleasant, well developed , black female in no acute distress Head: Normocephalic and atraumatic Eyes:  sclerae anicteric, conjunctiva pink  Ears: Normal auditory acuity Lungs: Clear throughout to auscultation Heart: Regular rate and rhythm Abdomen: Soft, non distended, non-tender. No masses, no hepatomegaly. Normal bowel sounds Musculoskeletal: Symmetrical with no gross deformities  Extremities: No edema  Neurological: Alert oriented x 4, grossly nonfocal Psychological:  Alert and cooperative. Normal mood and affect  Assessment and Recommendations:   53 year old female with chronic, intermittent abdominal discomfort / bloating. She does have a history of SBO but isn't obstructed by exam today. A low grade obstruction is not totally excluded of course. EGD in 2013 for upper abdominal pain was normal. CTenterography in November 2013 was negative for obstruction or other abnormalities. Dr. Jarold MottoPatterson had intended on ordering celiac studies but it appears they were not done. He wanted to treat her for possible small bowel bacterial overgrowth but Xifaxan was too expensive. Today we will obtain celiac studies and try a course of flagyl for possible small bacterial overgrowth.  Doubt gallbladder disease but will obtain an ultrasound as last one was in 2008. We discussed probiotics but those made her symptoms worse. She will follow up in clinic in 4-5 weeks, or sooner if need arises.

## 2013-06-09 ENCOUNTER — Ambulatory Visit (HOSPITAL_COMMUNITY)
Admission: RE | Admit: 2013-06-09 | Discharge: 2013-06-09 | Disposition: A | Discharge status: Home / Self Care | Payer: Federal, State, Local not specified - PPO | Source: Ambulatory Visit | Attending: Nurse Practitioner | Admitting: Nurse Practitioner

## 2013-06-09 DIAGNOSIS — R109 Unspecified abdominal pain: Secondary | ICD-10-CM | POA: Insufficient documentation

## 2013-06-09 DIAGNOSIS — R140 Abdominal distension (gaseous): Secondary | ICD-10-CM

## 2013-06-09 DIAGNOSIS — K7689 Other specified diseases of liver: Secondary | ICD-10-CM | POA: Insufficient documentation

## 2013-06-09 LAB — T-TRANSGLUTAMINASE (TTG) IGG

## 2013-06-09 NOTE — Progress Notes (Signed)
Agree with Ms. Guenther's assessment and plan. Carl E. Gessner, MD, FACG   

## 2013-06-19 ENCOUNTER — Other Ambulatory Visit (HOSPITAL_COMMUNITY): Payer: Self-pay | Admitting: Obstetrics and Gynecology

## 2013-06-19 DIAGNOSIS — R102 Pelvic and perineal pain: Secondary | ICD-10-CM

## 2013-06-23 ENCOUNTER — Ambulatory Visit (HOSPITAL_COMMUNITY)
Admission: RE | Admit: 2013-06-23 | Discharge: 2013-06-23 | Disposition: A | Discharge status: Home / Self Care | Payer: Federal, State, Local not specified - PPO | Source: Ambulatory Visit | Attending: Obstetrics and Gynecology | Admitting: Obstetrics and Gynecology

## 2013-06-23 DIAGNOSIS — R142 Eructation: Secondary | ICD-10-CM | POA: Insufficient documentation

## 2013-06-23 DIAGNOSIS — I700 Atherosclerosis of aorta: Secondary | ICD-10-CM | POA: Insufficient documentation

## 2013-06-23 DIAGNOSIS — R141 Gas pain: Secondary | ICD-10-CM | POA: Insufficient documentation

## 2013-06-23 DIAGNOSIS — Q649 Congenital malformation of urinary system, unspecified: Secondary | ICD-10-CM | POA: Insufficient documentation

## 2013-06-23 DIAGNOSIS — R143 Flatulence: Secondary | ICD-10-CM

## 2013-06-23 DIAGNOSIS — R102 Pelvic and perineal pain: Secondary | ICD-10-CM

## 2013-06-23 DIAGNOSIS — R1032 Left lower quadrant pain: Secondary | ICD-10-CM | POA: Insufficient documentation

## 2013-06-23 DIAGNOSIS — N9489 Other specified conditions associated with female genital organs and menstrual cycle: Secondary | ICD-10-CM | POA: Insufficient documentation

## 2013-06-23 LAB — CREATININE, SERUM
Creatinine, Ser: 0.84 mg/dL (ref 0.50–1.10)
GFR calc Af Amer: >90 mL/min (ref >90)
GFR calc non Af Amer: 79 mL/min — ABNORMAL LOW (ref >90)

## 2013-06-23 LAB — BUN: BUN: 12 mg/dL (ref 6–23)

## 2013-06-23 MED ORDER — IOHEXOL 300 MG/ML  SOLN: 100.0000 mL | Freq: Once | INTRAMUSCULAR | Status: AC | PRN

## 2013-07-24 ENCOUNTER — Ambulatory Visit: Payer: Federal, State, Local not specified - PPO | Admitting: Internal Medicine

## 2013-11-04 ENCOUNTER — Inpatient Hospital Stay (HOSPITAL_COMMUNITY)
Admission: EM | Admit: 2013-11-04 | Discharge: 2013-11-08 | DRG: 390 | Disposition: A | Discharge status: Home / Self Care | Payer: Federal, State, Local not specified - PPO | Attending: Internal Medicine | Admitting: Internal Medicine

## 2013-11-04 ENCOUNTER — Emergency Department (HOSPITAL_COMMUNITY): Payer: Federal, State, Local not specified - PPO

## 2013-11-04 ENCOUNTER — Encounter (HOSPITAL_COMMUNITY): Payer: Self-pay | Admitting: Emergency Medicine

## 2013-11-04 DIAGNOSIS — I10 Essential (primary) hypertension: Secondary | ICD-10-CM | POA: Diagnosis present

## 2013-11-04 DIAGNOSIS — K56609 Unspecified intestinal obstruction, unspecified as to partial versus complete obstruction: Secondary | ICD-10-CM

## 2013-11-04 DIAGNOSIS — R109 Unspecified abdominal pain: Secondary | ICD-10-CM

## 2013-11-04 DIAGNOSIS — K5289 Other specified noninfective gastroenteritis and colitis: Secondary | ICD-10-CM

## 2013-11-04 DIAGNOSIS — K529 Noninfective gastroenteritis and colitis, unspecified: Secondary | ICD-10-CM

## 2013-11-04 DIAGNOSIS — F3289 Other specified depressive episodes: Secondary | ICD-10-CM | POA: Diagnosis present

## 2013-11-04 DIAGNOSIS — Z833 Family history of diabetes mellitus: Secondary | ICD-10-CM

## 2013-11-04 DIAGNOSIS — F329 Major depressive disorder, single episode, unspecified: Secondary | ICD-10-CM | POA: Diagnosis present

## 2013-11-04 DIAGNOSIS — E7800 Pure hypercholesterolemia, unspecified: Secondary | ICD-10-CM | POA: Diagnosis present

## 2013-11-04 DIAGNOSIS — Z888 Allergy status to other drugs, medicaments and biological substances status: Secondary | ICD-10-CM

## 2013-11-04 DIAGNOSIS — E876 Hypokalemia: Secondary | ICD-10-CM | POA: Diagnosis present

## 2013-11-04 DIAGNOSIS — D72829 Elevated white blood cell count, unspecified: Secondary | ICD-10-CM

## 2013-11-04 DIAGNOSIS — F411 Generalized anxiety disorder: Secondary | ICD-10-CM | POA: Diagnosis present

## 2013-11-04 DIAGNOSIS — Z881 Allergy status to other antibiotic agents status: Secondary | ICD-10-CM

## 2013-11-04 DIAGNOSIS — G8929 Other chronic pain: Secondary | ICD-10-CM | POA: Diagnosis present

## 2013-11-04 DIAGNOSIS — K56600 Partial intestinal obstruction, unspecified as to cause: Secondary | ICD-10-CM | POA: Diagnosis present

## 2013-11-04 DIAGNOSIS — E785 Hyperlipidemia, unspecified: Secondary | ICD-10-CM | POA: Diagnosis present

## 2013-11-04 DIAGNOSIS — E119 Type 2 diabetes mellitus without complications: Secondary | ICD-10-CM | POA: Diagnosis present

## 2013-11-04 DIAGNOSIS — Z91018 Allergy to other foods: Secondary | ICD-10-CM

## 2013-11-04 DIAGNOSIS — D509 Iron deficiency anemia, unspecified: Secondary | ICD-10-CM | POA: Diagnosis present

## 2013-11-04 DIAGNOSIS — R140 Abdominal distension (gaseous): Secondary | ICD-10-CM

## 2013-11-04 DIAGNOSIS — Z79899 Other long term (current) drug therapy: Secondary | ICD-10-CM

## 2013-11-04 DIAGNOSIS — E118 Type 2 diabetes mellitus with unspecified complications: Secondary | ICD-10-CM

## 2013-11-04 DIAGNOSIS — R1032 Left lower quadrant pain: Secondary | ICD-10-CM | POA: Diagnosis present

## 2013-11-04 HISTORY — DX: Noninfective gastroenteritis and colitis, unspecified: K52.9

## 2013-11-04 HISTORY — DX: Partial intestinal obstruction, unspecified as to cause: K56.600

## 2013-11-04 HISTORY — DX: Prediabetes: R73.03

## 2013-11-04 LAB — URINE MICROSCOPIC-ADD ON

## 2013-11-04 LAB — COMPREHENSIVE METABOLIC PANEL
ALK PHOS: 102 U/L (ref 39–117)
ALT: 14 U/L (ref 0–35)
ANION GAP: 13 (ref 5–15)
AST: 18 U/L (ref 0–37)
Albumin: 3.6 g/dL (ref 3.5–5.2)
BUN: 17 mg/dL (ref 6–23)
CALCIUM: 10 mg/dL (ref 8.4–10.5)
CO2: 28 mEq/L (ref 19–32)
Chloride: 99 mEq/L (ref 96–112)
Creatinine, Ser: 1.06 mg/dL (ref 0.50–1.10)
GFR calc non Af Amer: 60 mL/min — ABNORMAL LOW (ref >90)
GFR, EST AFRICAN AMERICAN: 69 mL/min — AB (ref >90)
Glucose, Bld: 120 mg/dL — ABNORMAL HIGH (ref 70–99)
Potassium: 4.2 mEq/L (ref 3.7–5.3)
Sodium: 140 mEq/L (ref 137–147)
TOTAL PROTEIN: 7.8 g/dL (ref 6.0–8.3)
Total Bilirubin: <0.2 mg/dL — ABNORMAL LOW (ref 0.3–1.2)

## 2013-11-04 LAB — SEDIMENTATION RATE: Sed Rate: 18 mm/hr (ref 0–22)

## 2013-11-04 LAB — CBC WITH DIFFERENTIAL/PLATELET
BASOS PCT: 0 % (ref 0–1)
Basophils Absolute: 0 10*3/uL (ref 0.0–0.1)
EOS ABS: 0.5 10*3/uL (ref 0.0–0.7)
Eosinophils Relative: 4 % (ref 0–5)
HCT: 37.4 % (ref 36.0–46.0)
Hemoglobin: 11.6 g/dL — ABNORMAL LOW (ref 12.0–15.0)
Lymphocytes Relative: 20 % (ref 12–46)
Lymphs Abs: 2.7 10*3/uL (ref 0.7–4.0)
MCH: 24.7 pg — AB (ref 26.0–34.0)
MCHC: 31 g/dL (ref 30.0–36.0)
MCV: 79.7 fL (ref 78.0–100.0)
MONOS PCT: 4 % (ref 3–12)
Monocytes Absolute: 0.5 10*3/uL (ref 0.1–1.0)
Neutro Abs: 9.5 10*3/uL — ABNORMAL HIGH (ref 1.7–7.7)
Neutrophils Relative %: 72 % (ref 43–77)
Platelets: 332 10*3/uL (ref 150–400)
RBC: 4.69 MIL/uL (ref 3.87–5.11)
RDW: 14.3 % (ref 11.5–15.5)
WBC: 13.2 10*3/uL — ABNORMAL HIGH (ref 4.0–10.5)

## 2013-11-04 LAB — URINALYSIS, ROUTINE W REFLEX MICROSCOPIC
Bilirubin Urine: NEGATIVE
Glucose, UA: NEGATIVE mg/dL
Ketones, ur: NEGATIVE mg/dL
Leukocytes, UA: NEGATIVE
NITRITE: NEGATIVE
PROTEIN: NEGATIVE mg/dL
Specific Gravity, Urine: 1.025 (ref 1.005–1.030)
Urobilinogen, UA: 0.2 mg/dL (ref 0.0–1.0)
pH: 5 (ref 5.0–8.0)

## 2013-11-04 LAB — GLUCOSE, CAPILLARY
GLUCOSE-CAPILLARY: 100 mg/dL — AB (ref 70–99)
Glucose-Capillary: 99 mg/dL (ref 70–99)
Glucose-Capillary: 123 mg/dL — ABNORMAL HIGH (ref 70–99)

## 2013-11-04 LAB — C-REACTIVE PROTEIN

## 2013-11-04 LAB — LIPASE, BLOOD: LIPASE: 24 U/L (ref 11–59)

## 2013-11-04 LAB — I-STAT CG4 LACTIC ACID, ED: Lactic Acid, Venous: 0.54 mmol/L (ref 0.5–2.2)

## 2013-11-04 MED ORDER — MORPHINE SULFATE 4 MG/ML IJ SOLN
4.0000 mg | Freq: Once | INTRAMUSCULAR | Status: AC
  Administered 2013-11-04: 4 mg via INTRAVENOUS
  Filled 2013-11-04: qty 1

## 2013-11-04 MED ORDER — IOHEXOL 300 MG/ML  SOLN
100.0000 mL | Freq: Once | INTRAMUSCULAR | Status: AC | PRN
  Administered 2013-11-04: 100 mL via INTRAVENOUS

## 2013-11-04 MED ORDER — ONDANSETRON HCL 4 MG PO TABS: 4.0000 mg | ORAL_TABLET | Freq: Four times a day (QID) | ORAL | Status: DC | PRN

## 2013-11-04 MED ORDER — ONDANSETRON HCL 4 MG/2ML IJ SOLN
4.0000 mg | Freq: Once | INTRAMUSCULAR | Status: AC
  Administered 2013-11-04: 4 mg via INTRAVENOUS
  Filled 2013-11-04: qty 2

## 2013-11-04 MED ORDER — ENOXAPARIN SODIUM 40 MG/0.4ML ~~LOC~~ SOLN
40.0000 mg | SUBCUTANEOUS | Status: DC
  Administered 2013-11-04 – 2013-11-07 (×4): 40 mg via SUBCUTANEOUS
  Filled 2013-11-04 (×5): qty 0.4

## 2013-11-04 MED ORDER — IOHEXOL 300 MG/ML  SOLN
50.0000 mL | Freq: Once | INTRAMUSCULAR | Status: AC | PRN
  Administered 2013-11-04: 50 mL via ORAL

## 2013-11-04 MED ORDER — MORPHINE SULFATE 4 MG/ML IJ SOLN: 4.0000 mg | INTRAMUSCULAR | Status: DC | PRN

## 2013-11-04 MED ORDER — FAMOTIDINE IN NACL 20-0.9 MG/50ML-% IV SOLN
20.0000 mg | Freq: Two times a day (BID) | INTRAVENOUS | Status: DC
  Administered 2013-11-04 – 2013-11-07 (×8): 20 mg via INTRAVENOUS
  Filled 2013-11-04 (×9): qty 50

## 2013-11-04 MED ORDER — CIPROFLOXACIN IN D5W 400 MG/200ML IV SOLN
400.0000 mg | Freq: Two times a day (BID) | INTRAVENOUS | Status: DC
  Administered 2013-11-04 – 2013-11-07 (×6): 400 mg via INTRAVENOUS
  Filled 2013-11-04 (×7): qty 200

## 2013-11-04 MED ORDER — MORPHINE SULFATE 2 MG/ML IJ SOLN
2.0000 mg | INTRAMUSCULAR | Status: DC | PRN
  Administered 2013-11-04: 2 mg via INTRAVENOUS
  Filled 2013-11-04: qty 1

## 2013-11-04 MED ORDER — SERTRALINE HCL 50 MG PO TABS
50.0000 mg | ORAL_TABLET | Freq: Every day | ORAL | Status: DC
  Administered 2013-11-04 – 2013-11-08 (×4): 50 mg via ORAL
  Filled 2013-11-04 (×5): qty 1

## 2013-11-04 MED ORDER — MORPHINE SULFATE 4 MG/ML IJ SOLN
4.0000 mg | INTRAMUSCULAR | Status: DC | PRN
  Administered 2013-11-04 – 2013-11-06 (×9): 4 mg via INTRAVENOUS
  Filled 2013-11-04 (×9): qty 1

## 2013-11-04 MED ORDER — SIMVASTATIN 40 MG PO TABS
40.0000 mg | ORAL_TABLET | Freq: Every evening | ORAL | Status: DC
  Administered 2013-11-06 – 2013-11-07 (×2): 40 mg via ORAL
  Filled 2013-11-04 (×5): qty 1

## 2013-11-04 MED ORDER — CLONAZEPAM 0.5 MG PO TABS: 0.5000 mg | ORAL_TABLET | Freq: Every evening | ORAL | Status: DC | PRN

## 2013-11-04 MED ORDER — ZOLPIDEM TARTRATE 5 MG PO TABS: 5.0000 mg | ORAL_TABLET | Freq: Every evening | ORAL | Status: DC | PRN

## 2013-11-04 MED ORDER — LOSARTAN POTASSIUM 50 MG PO TABS
50.0000 mg | ORAL_TABLET | Freq: Every day | ORAL | Status: DC
  Filled 2013-11-04: qty 1

## 2013-11-04 MED ORDER — SODIUM CHLORIDE 0.9 % IV SOLN
INTRAVENOUS | Status: DC
  Administered 2013-11-04 – 2013-11-08 (×7): via INTRAVENOUS

## 2013-11-04 MED ORDER — PHENOL 1.4 % MT LIQD
1.0000 | OROMUCOSAL | Status: DC | PRN
  Administered 2013-11-04: 1 via OROMUCOSAL
  Filled 2013-11-04: qty 177

## 2013-11-04 MED ORDER — METRONIDAZOLE IN NACL 5-0.79 MG/ML-% IV SOLN
500.0000 mg | Freq: Four times a day (QID) | INTRAVENOUS | Status: DC
  Administered 2013-11-04 – 2013-11-07 (×12): 500 mg via INTRAVENOUS
  Filled 2013-11-04 (×13): qty 100

## 2013-11-04 MED ORDER — INSULIN ASPART 100 UNIT/ML ~~LOC~~ SOLN
0.0000 [IU] | Freq: Four times a day (QID) | SUBCUTANEOUS | Status: DC
  Administered 2013-11-04: 1 [IU] via SUBCUTANEOUS
  Administered 2013-11-05: 2 [IU] via SUBCUTANEOUS
  Administered 2013-11-08: 1 [IU] via SUBCUTANEOUS

## 2013-11-04 MED ORDER — ONDANSETRON HCL 4 MG/2ML IJ SOLN
4.0000 mg | Freq: Four times a day (QID) | INTRAMUSCULAR | Status: DC | PRN
  Administered 2013-11-04 – 2013-11-07 (×4): 4 mg via INTRAVENOUS
  Filled 2013-11-04 (×4): qty 2

## 2013-11-04 NOTE — ED Provider Notes (Signed)
CSN: [id number]     Arrival date & time 11/04/13  0115 History   First MD Initiated Contact with Patient 11/04/13 0154     Chief Complaint  Patient presents with  . Abdominal Pain    generalized h/o obstruction, states it feels the same     (Consider location/radiation/quality/duration/timing/severity/associated sxs/prior Treatment) HPI Comments: Patient with a history of SBO presents today with a chief complaint of generalized abdominal pain.  She reports that the pain has been constant since 5 PM this evening.  Pain gradually worsening.  She reports that initially the pain felt like cramping, but now the pain has intensified.  She has taken Hyoscyamine for the pain, but does not feel that it helps.  She reports that she had a BM just prior to arrival, but the BM was very small.  No improvement in pain after the BM.  She denies melena or hematochezia.  She reports nausea, but denies vomiting.  Denies diarrhea, fever,chills, or urinary symptoms.  She reports that the pain feels similar to the pain that she has had in the past with a SBO.  Prior surgeries include an Explorative Laparotomy, Ectopic Pregnancy Surgery, Abdominal Hysterectomy, Uterine Fibroid Surgery, and C-Section.    The history is provided by the patient.    Past Medical History  Diagnosis Date  . Bowel obstruction   . Ectopic pregnancy   . Hypertension   . Hypercholesteremia   . Depression   . Sleep disorder   . Borderline diabetic    Past Surgical History  Procedure Laterality Date  . Abdominal hysterectomy  2003  . Ectopic pregnancy surgery      tubal removel  . Exploratory laparotomy    . Uterine fibroid surgery    . Cesarean section  2003  . Tonsillectomy    . Esophagogastroduodenoscopy    . Colonoscopy     Family History  Problem Relation Age of Onset  . Colon cancer Neg Hx   . Diabetes Mother    History  Substance Use Topics  . Smoking status: Never Smoker   . Smokeless tobacco: Never Used  .  Alcohol Use: No   OB History   Grav Para Term Preterm Abortions TAB SAB Ect Mult Living                 Review of Systems  All other systems reviewed and are negative.     Allergies  Amoxicillin; Strawberry; Ibuprofen; Tetracyclines & related; and Wellbutrin  Home Medications   Prior to Admission medications   Medication Sig Start Date End Date Taking? Authorizing Provider  clonazePAM (KLONOPIN) 0.5 MG tablet Take 0.5 mg by mouth at bedtime as needed for anxiety.    Yes Historical Provider, MD  hyoscyamine (LEVSIN SL) 0.125 MG SL tablet Place 1 tablet (0.125 mg total) under the tongue every 4 (four) hours as needed for cramping. 10/22/12  Yes Mardella Layman, MD  losartan (COZAAR) 50 MG tablet Take 50 mg by mouth daily.   Yes Historical Provider, MD  metFORMIN (GLUCOPHAGE) 500 MG tablet Take 500 mg by mouth 2 (two) times daily with a meal.  05/11/13  Yes Historical Provider, MD  sertraline (ZOLOFT) 50 MG tablet Take 50 mg by mouth daily.  05/08/13  Yes Historical Provider, MD  simvastatin (ZOCOR) 40 MG tablet Take 40 mg by mouth every evening.   Yes Historical Provider, MD  zolpidem (AMBIEN) 5 MG tablet Take 5 mg by mouth at bedtime as needed for sleep.  04/11/13  Yes Historical Provider, MD   BP 136/90  Pulse 73  Temp(Src) 98.3 F (36.8 C) (Oral)  Resp 18  SpO2 100% Physical Exam  Nursing note and vitals reviewed. Constitutional: She appears well-developed and well-nourished.  HENT:  Head: Normocephalic and atraumatic.  Mouth/Throat: Oropharynx is clear and moist.  Neck: Normal range of motion. Neck supple.  Cardiovascular: Normal rate, regular rhythm and normal heart sounds.   Pulmonary/Chest: Effort normal and breath sounds normal.  Abdominal: Normal appearance. She exhibits distension. She exhibits no mass. Bowel sounds are decreased. There is generalized tenderness. There is no rebound and no guarding.  Musculoskeletal: Normal range of motion.  Neurological: She is  alert.  Skin: Skin is warm and dry.  Psychiatric: She has a normal mood and affect.    ED Course  Procedures (including critical care time) Labs Review Labs Reviewed  CBC WITH DIFFERENTIAL  COMPREHENSIVE METABOLIC PANEL  LIPASE, BLOOD  URINALYSIS, ROUTINE W REFLEX MICROSCOPIC    Imaging Review Ct Abdomen Pelvis W Contrast  11/04/2013   CLINICAL DATA:  Generalized abdominal pain and cramping. White cell count 13.2.  EXAM: CT ABDOMEN AND PELVIS WITH CONTRAST  TECHNIQUE: Multidetector CT imaging of the abdomen and pelvis was performed using the standard protocol following bolus administration of intravenous contrast.  CONTRAST:  100mL OMNIPAQUE IOHEXOL 300 MG/ML  SOLN  COMPARISON:  06/23/2013  FINDINGS: Mild dependent changes in the lung bases.  Mild fatty infiltration of the liver. No focal liver lesions. The gallbladder, spleen, pancreas, adrenal glands, kidneys, abdominal aorta, inferior vena cava, and retroperitoneal lymph nodes are unremarkable. Stomach and proximal small bowel are normal. There is a mild fluid distention of the distal jejunum/proximal ileum with a decompression of the distal ileum. No definite wall thickening. Infiltration is present in the mesenteric. Findings may represent partial obstruction or enteritis. Obstruction is favored due to the distal small bowel decompression. Transition zone is in the right lower quadrant. The presence of mesenteric edema may indicate associated inflammatory process. Small bowel ischemia is not excluded although there is no evidence of significant wall thickening or pneumatosis. Visualized mesenteric artery and vein appear patent. No free air in the abdomen.  Pelvis: Small amount of free fluid in the pelvis is likely to be reactive. Uterus is surgically absent. No pelvic mass or lymphadenopathy. Bladder wall is not thickened. Appendix is normal. No significant lymphadenopathy in the pelvis. No destructive bone lesions.  IMPRESSION: Abnormal  distended fluid-filled small bowel in the anterior mid abdomen with mesenteric edema. Distal small bowel is decompressed. Changes likely to represent partial obstruction. Ischemia or enteritis may also be present.   Electronically Signed   By: Burman NievesWilliam  Stevens M.D.   On: 11/04/2013 04:10   Dg Abd 2 Views  11/05/2013   CLINICAL DATA:  Small bowel obstruction, upper abdominal pain today  EXAM: ABDOMEN - 2 VIEW  COMPARISON:  11/04/2013 CT scan  FINDINGS: NG tube projects over the stomach. No abnormally dilated loops of bowel. Oral contrast is seen to the colon into the descending colon. There is gas seen as far as the rectosigmoid junction. There are a few air-fluid levels in the central abdomen in small bowel, which does not appear to be significantly distended.  IMPRESSION: Improving incomplete small bowel obstruction   Electronically Signed   By: Esperanza Heiraymond  Rubner M.D.   On: 11/05/2013 10:07     EKG Interpretation None     4:45 AM Reassessed patient.  She reports that initially the Morphine  helped with the pain, but now the pain has returned.  Repeat abdominal exam:  Abdomen distended with diffuse tenderness to palpation. 5:00 Am Discussed with Dr. Allena Katz who has agreed to admit the patient.   MDM   Final diagnoses:  None   Patient with a history of SBO presents today with generalized abdominal pain, which she reports feels similar to pain that she has had in the past with a SBO.  CT ab/pelvis shows a partial SBO, possible ischemia or enteritis.  Lactate WNL.  Patient admitted to [facility] for further management of SBO.      Santiago Glad, PA-C 11/05/13 1424

## 2013-11-04 NOTE — Progress Notes (Signed)
[facility] PROGRESS NOTE  [name] ZOX:[id number] DOB: [birth year] DOA: 11/04/2013 PCP: Katy ApoPOLITE,RONALD D, MD  Assessment/Plan:  Pt reports more pain, not improving; place NG, increase pain control, IVF, atx; consult surgery   [name], [name]  [facility] Pager [phone]. If 7PM-7AM, please contact night-coverage at [email or web address], password [email or web address] 11/04/2013, 1:01 PM  LOS: 0 days

## 2013-11-04 NOTE — Care Management Note (Addendum)
    Page 1 of 1   11/07/2013     12:45:07 PM CARE MANAGEMENT NOTE 11/07/2013  Patient:  [name],[name]   Account Number:  [id number]  Date Initiated:  11/04/2013  Documentation initiated by:  [name],[name]  Subjective/Objective Assessment:   52 Y/O F ADMITTED W/PSBO.     Action/Plan:   FROM HOME.   Anticipated DC Date:  11/10/2013   Anticipated DC Plan:  HOME/SELF CARE      DC Planning Services  CM consult      Choice offered to / List presented to:             Status of service:  In process, will continue to follow Medicare Important Message given?   (If response is "NO", the following Medicare IM given date fields will be blank) Date Medicare IM given:   Medicare IM given by:   Date Additional Medicare IM given:   Additional Medicare IM given by:    Discharge Disposition:    Per UR Regulation:  Reviewed for med. necessity/level of care/duration of stay  If discussed at Long Length of Stay Meetings, dates discussed:    Comments:  11/07/13 Charlene Lasecki RN,BSN NCM [phone] PSBO-IV ABX,ADVANCING DIET.NO ANTICIPATED D/C NEEDS.  11/04/13 Charlene Stcharles RN,BSN NCM [phone] SX FOLLOWING.PAIN CONTROL ISSUES.NO ANTICIPATED D/C NEEDS.

## 2013-11-04 NOTE — H&P (Signed)
[facility] History and Physical  Patient: [name]  ZOX:[id number]  DOB: 1960/07/14  DOS: the patient was seen and examined on 11/04/2013 PCP: Katy Apo, MD  Chief Complaint: Abdominal pain  HPI: [name] is a 53 y.o. female with Past medical history of breath abdominal pain, multiple abdominal surgery, bowel obstruction, hypertension, depression, diabetes. Patient presented with complaints of abdominal pain. She mentions symptoms started at 5:30 PM. Prior to that she was at her baseline and did not have any complain. Records she did not have any fever, chills, nausea, vomiting, recent change in her medication, diarrhea, constipation. At around 5:30 she started having diffuse abdominal pain which progressively got worse and did not improve despite taking hyoscyamine twice and therefore she decided to come to the hospital. She has chronic abdominal pain which is on the left lower quadrant and which is always tolerable which occurs on and off and is not present at this point. Today's pain was diffuse and was cramping and throbbing in nature felt like a muscle contraction. She did not have any nausea or vomiting she denies any diarrhea or constipation. She had a bowel movement after the episode which was nonbloody and did not have any melena. She denies any fever chills burning urination.  The patient is coming from home. And at her baseline independent for most of her ADL.  Review of Systems: as mentioned in the history of present illness.  A Comprehensive review of the other systems is negative.  Past Medical History  Diagnosis Date  . Bowel obstruction   . Ectopic pregnancy   . Hypertension   . Hypercholesteremia   . Depression   . Sleep disorder   . Borderline diabetic    Past Surgical History  Procedure Laterality Date  . Abdominal hysterectomy  2003  . Ectopic pregnancy surgery      tubal removel  . Exploratory laparotomy    . Uterine fibroid surgery     . Cesarean section  2003  . Tonsillectomy    . Esophagogastroduodenoscopy    . Colonoscopy     Social History:  reports that she has never smoked. She has never used smokeless tobacco. She reports that she does not drink alcohol or use illicit drugs.  Allergies  Allergen Reactions  . Amoxicillin Anaphylaxis  . Strawberry Shortness Of Breath and Swelling  . Ibuprofen Other (See Comments)    Acute renal failure  . Tetracyclines & Related Rash  . Wellbutrin [Bupropion] Rash    Family History  Problem Relation Age of Onset  . Colon cancer Neg Hx   . Diabetes Mother     Prior to Admission medications   Medication Sig Start Date End Date Taking? Authorizing Provider  clonazePAM (KLONOPIN) 0.5 MG tablet Take 0.5 mg by mouth at bedtime as needed for anxiety.    Yes Historical Provider, MD  hyoscyamine (LEVSIN SL) 0.125 MG SL tablet Place 1 tablet (0.125 mg total) under the tongue every 4 (four) hours as needed for cramping. 10/22/12  Yes Mardella Layman, MD  losartan (COZAAR) 50 MG tablet Take 50 mg by mouth daily.   Yes Historical Provider, MD  metFORMIN (GLUCOPHAGE) 500 MG tablet Take 500 mg by mouth 2 (two) times daily with a meal.  05/11/13  Yes Historical Provider, MD  sertraline (ZOLOFT) 50 MG tablet Take 50 mg by mouth daily.  05/08/13  Yes Historical Provider, MD  simvastatin (ZOCOR) 40 MG tablet Take 40 mg by mouth every evening.  Yes Historical Provider, MD  zolpidem (AMBIEN) 5 MG tablet Take 5 mg by mouth at bedtime as needed for sleep.  04/11/13  Yes Historical Provider, MD    Physical Exam: Filed Vitals:   11/04/13 0122 11/04/13 0411  BP: 136/90 124/72  Pulse: 73 78  Temp: 98.3 F (36.8 C) 98.5 F (36.9 C)  TempSrc: Oral Oral  Resp: 18 18  SpO2: 100% 99%    General: Alert, Awake and Oriented to Time, Place and Person. Appear in mild distress Eyes: PERRL ENT: Oral Mucosa clear moist. Neck: no JVD Cardiovascular: S1 and S2 Present, no Murmur, Peripheral Pulses  Present Respiratory: Bilateral Air entry equal and Decreased, Clear to Auscultation, noCrackles, no wheezes Abdomen: Bowel Sound Present, Soft and diffuse bilateral tender Skin: no Rash Extremities: no Pedal edema, no calf tenderness Neurologic: Grossly no focal neuro deficit.  Labs on Admission:  CBC:  Recent Labs Lab 11/04/13 0206  WBC 13.2*  NEUTROABS 9.5*  HGB 11.6*  HCT 37.4  MCV 79.7  PLT 332    CMP     Component Value Date/Time   NA 140 11/04/2013 0206   K 4.2 11/04/2013 0206   CL 99 11/04/2013 0206   CO2 28 11/04/2013 0206   GLUCOSE 120* 11/04/2013 0206   BUN 17 11/04/2013 0206   CREATININE 1.06 11/04/2013 0206   CALCIUM 10.0 11/04/2013 0206   PROT 7.8 11/04/2013 0206   ALBUMIN 3.6 11/04/2013 0206   AST 18 11/04/2013 0206   ALT 14 11/04/2013 0206   ALKPHOS 102 11/04/2013 0206   BILITOT <0.2* 11/04/2013 0206   GFRNONAA 60* 11/04/2013 0206   GFRAA 69* 11/04/2013 0206     Recent Labs Lab 11/04/13 0206  LIPASE 24   No results found for this basename: AMMONIA,  in the last 168 hours  No results found for this basename: CKTOTAL, CKMB, CKMBINDEX, TROPONINI,  in the last 168 hours BNP (last 3 results) No results found for this basename: PROBNP,  in the last 8760 hours  Radiological Exams on Admission: Ct Abdomen Pelvis W Contrast  11/04/2013   CLINICAL DATA:  Generalized abdominal pain and cramping. White cell count 13.2.  EXAM: CT ABDOMEN AND PELVIS WITH CONTRAST  TECHNIQUE: Multidetector CT imaging of the abdomen and pelvis was performed using the standard protocol following bolus administration of intravenous contrast.  CONTRAST:  OMNIPAQUE IOHEXOL 300 MG/ML  SOLN  COMPARISON:  06/23/2013  FINDINGS: Mild dependent changes in the lung bases.  Mild fatty infiltration of the liver. No focal liver lesions. The gallbladder, spleen, pancreas, adrenal glands, kidneys, abdominal aorta, inferior vena cava, and retroperitoneal lymph nodes are unremarkable. Stomach and proximal  small bowel are normal. There is a mild fluid distention of the distal jejunum/proximal ileum with a decompression of the distal ileum. No definite wall thickening. Infiltration is present in the mesenteric. Findings may represent partial obstruction or enteritis. Obstruction is favored due to the distal small bowel decompression. Transition zone is in the right lower quadrant. The presence of mesenteric edema may indicate associated inflammatory process. Small bowel ischemia is not excluded although there is no evidence of significant wall thickening or pneumatosis. Visualized mesenteric artery and vein appear patent. No free air in the abdomen.  Pelvis: Small amount of free fluid in the pelvis is likely to be reactive. Uterus is surgically absent. No pelvic mass or lymphadenopathy. Bladder wall is not thickened. Appendix is normal. No significant lymphadenopathy in the pelvis. No destructive bone lesions.  IMPRESSION: Abnormal distended  fluid-filled small bowel in the anterior mid abdomen with mesenteric edema. Distal small bowel is decompressed. Changes likely to represent partial obstruction. Ischemia or enteritis may also be present.   Electronically Signed   By: Burman NievesWilliam  Stevens M.D.   On: 11/04/2013 04:10   Assessment/Plan Principal Problem:   Partial small bowel obstruction Active Problems:   Enteritis   Diabetes mellitus   Anxiety state, unspecified   Chronic abdominal pain   Essential hypertension, benign   1. Partial small bowel obstruction Patient is presenting with complaints of abdominal pain. A CT of the abdomen was performed which shows partial small bowel obstruction with distant medium decompression. This could also represent an enteritis as per the radiologist. Her lactic acid is normal and she did not have any blood in bowel movement and does possibility of bowel ischemia is less likely. With this the patient is currently admitted in the hospital. We continued to give her IV  hydration, IV pain management IV Zofran and IV absent. IV fluids and maintain her n.p.o. except medications and ice chips. If she starts having nausea and vomiting that she may require an NG tube insertion. If she does not improve then she may also require a general surgery consultation.  2. Diabetes mellitus. Hold metformin place on sliding scale.  3. Hypertension. Continue her home medication.  4. Anxiety. Continue her home medications  DVT Prophylaxis: subcutaneous Heparin Nutrition: N.p.o. except medication  Code Status: Full  Disposition: Admitted to inpatient in med-surge unit.  Author: Lynden OxfordPranav Bryson Gavia, MD [facility] Pager: [phone] 11/04/2013, 6:05 AM    If 7PM-7AM, please contact night-coverage [email or web address] Password [email or web address]  **Disclaimer: This note may have been dictated with voice recognition software. Similar sounding words can inadvertently be transcribed and this note may contain transcription errors which may not have been corrected upon publication of note.**

## 2013-11-04 NOTE — Consult Note (Signed)
[facility] CONSULTATION NOTE  Reason for Consult: Small Bowel Obstruction Referring Physician: Dr. Rowe Clack    HPI: [name] is a 53 year old female with a history of hypertension, depression, pre-diabetes, abdominal hysterectomy with LOA in 2003, 3 additional GYN surgeries, multiple small bowel obstructions and chronic abdominal pain.  We have been asked in consultation for evaluation of acutely worsening abdominal pain.  Onset of symptoms was sudden last night at 5PM after eating dinner.  Location is upper abdomen.  Characterized as cramping "period like pain" which waxes and wanes in severity.  Time pattern is constant.  Coarse is worsening.  Associated with nausea and vomiting which is new this afternoon.  Last BM was last evening, constipated.  Denies recent constipation or diarrhea.  Reports weight gain.  Denies change in bowel pattern.  She reports being pain free x1 before last night episode.  Modifying factors include; levsin which seemed to make her pain worse.   She has been followed by Dr. Sharlett Iles with GI.  Seen by Dr. Carlean Purl in March of 2015.  She had an unrevealing colonoscopy and upper endoscopy in 2013.  Her work up in the ED shows a normal lactic acid.  White count of 13.2K. a CT of abdomen and pelvis revealed a small bowel obstruction with possible ischemia or enteritis.  She denies fever, chills or sweats.  Denies melena or hematochezia.  She does not have a NGT yet.  She has been NPO since last night.     Past Medical History  Diagnosis Date  . Bowel obstruction   . Ectopic pregnancy   . Hypertension   . Hypercholesteremia   . Depression   . Sleep disorder   . Borderline diabetic     Past Surgical History  Procedure Laterality Date  . Abdominal hysterectomy  2003  . Ectopic pregnancy surgery      tubal removel  . Exploratory laparotomy    . Uterine fibroid surgery    . Cesarean section  2003  . Tonsillectomy    . Esophagogastroduodenoscopy     . Colonoscopy      Family History  Problem Relation Age of Onset  . Colon cancer Neg Hx   . Diabetes Mother     Social History:  reports that she has never smoked. She has never used smokeless tobacco. She reports that she does not drink alcohol or use illicit drugs.  Allergies:  Allergies  Allergen Reactions  . Amoxicillin Anaphylaxis  . Strawberry Shortness Of Breath and Swelling  . Ibuprofen Other (See Comments)    Acute renal failure  . Tetracyclines & Related Rash  . Wellbutrin [Bupropion] Rash    Medications:  Scheduled Meds: . ciprofloxacin  400 mg Intravenous Q12H  . enoxaparin (LOVENOX) injection  40 mg Subcutaneous Q24H  . famotidine (PEPCID) IV  20 mg Intravenous Q12H  . insulin aspart  0-9 Units Subcutaneous Q6H  . metronidazole  500 mg Intravenous Q6H  . sertraline  50 mg Oral Daily  . simvastatin  40 mg Oral QPM   Continuous Infusions: . sodium chloride 100 mL/hr at 11/04/13 0603   PRN Meds:.clonazePAM, morphine injection, ondansetron (ZOFRAN) IV, ondansetron, zolpidem  Results for orders placed during the hospital encounter of 11/04/13 (from the past 48 hour(s))  CBC WITH DIFFERENTIAL     Status: Abnormal   Collection Time    11/04/13  2:06 AM      Result Value Ref Range   WBC 13.2 (*) 4.0 -  10.5 K/uL   RBC 4.69  3.87 - 5.11 MIL/uL   Hemoglobin 11.6 (*) 12.0 - 15.0 g/dL   HCT 37.4  36.0 - 46.0 %   MCV 79.7  78.0 - 100.0 fL   MCH 24.7 (*) 26.0 - 34.0 pg   MCHC 31.0  30.0 - 36.0 g/dL   RDW 14.3  11.5 - 15.5 %   Platelets 332  150 - 400 K/uL   Neutrophils Relative % 72  43 - 77 %   Neutro Abs 9.5 (*) 1.7 - 7.7 K/uL   Lymphocytes Relative 20  12 - 46 %   Lymphs Abs 2.7  0.7 - 4.0 K/uL   Monocytes Relative 4  3 - 12 %   Monocytes Absolute 0.5  0.1 - 1.0 K/uL   Eosinophils Relative 4  0 - 5 %   Eosinophils Absolute 0.5  0.0 - 0.7 K/uL   Basophils Relative 0  0 - 1 %   Basophils Absolute 0.0  0.0 - 0.1 K/uL  COMPREHENSIVE METABOLIC PANEL      Status: Abnormal   Collection Time    11/04/13  2:06 AM      Result Value Ref Range   Sodium 140  137 - 147 mEq/L   Potassium 4.2  3.7 - 5.3 mEq/L   Chloride 99  96 - 112 mEq/L   CO2 28  19 - 32 mEq/L   Glucose, Bld 120 (*) 70 - 99 mg/dL   BUN 17  6 - 23 mg/dL   Creatinine, Ser 1.06  0.50 - 1.10 mg/dL   Calcium 10.0  8.4 - 10.5 mg/dL   Total Protein 7.8  6.0 - 8.3 g/dL   Albumin 3.6  3.5 - 5.2 g/dL   AST 18  0 - 37 U/L   ALT 14  0 - 35 U/L   Alkaline Phosphatase 102  39 - 117 U/L   Total Bilirubin <0.2 (*) 0.3 - 1.2 mg/dL   GFR calc non Af Amer 60 (*) >90 mL/min   GFR calc Af Amer 69 (*) >90 mL/min   Comment: (NOTE)     The eGFR has been calculated using the CKD EPI equation.     This calculation has not been validated in all clinical situations.     eGFR's persistently <90 mL/min signify possible Chronic Kidney     Disease.   Anion gap 13  5 - 15  LIPASE, BLOOD     Status: None   Collection Time    11/04/13  2:06 AM      Result Value Ref Range   Lipase 24  11 - 59 U/L  SEDIMENTATION RATE     Status: None   Collection Time    11/04/13  2:06 AM      Result Value Ref Range   Sed Rate 18  0 - 22 mm/hr  C-REACTIVE PROTEIN     Status: Abnormal   Collection Time    11/04/13  2:06 AM      Result Value Ref Range   CRP <0.5 (*) <0.60 mg/dL   Comment: Performed at [place]     Status: Abnormal   Collection Time    11/04/13  2:22 AM      Result Value Ref Range   Color, Urine YELLOW  YELLOW   APPearance CLEAR  CLEAR   Specific Gravity, Urine 1.025  1.005 - 1.030   pH 5.0  5.0 - 8.0  Glucose, UA NEGATIVE  NEGATIVE mg/dL   Hgb urine dipstick MODERATE (*) NEGATIVE   Bilirubin Urine NEGATIVE  NEGATIVE   Ketones, ur NEGATIVE  NEGATIVE mg/dL   Protein, ur NEGATIVE  NEGATIVE mg/dL   Urobilinogen, UA 0.2  0.0 - 1.0 mg/dL   Nitrite NEGATIVE  NEGATIVE   Leukocytes, UA NEGATIVE  NEGATIVE  URINE MICROSCOPIC-ADD ON     Status:  None   Collection Time    11/04/13  2:22 AM      Result Value Ref Range   Squamous Epithelial / LPF RARE  RARE   WBC, UA 0-2  <3 WBC/hpf   RBC / HPF 3-6  <3 RBC/hpf   Bacteria, UA RARE  RARE   Urine-Other MUCOUS PRESENT    I-STAT CG4 LACTIC ACID, ED     Status: None   Collection Time    11/04/13  5:13 AM      Result Value Ref Range   Lactic Acid, Venous 0.54  0.5 - 2.2 mmol/L  GLUCOSE, CAPILLARY     Status: Abnormal   Collection Time    11/04/13  6:19 AM      Result Value Ref Range   Glucose-Capillary 100 (*) 70 - 99 mg/dL  GLUCOSE, CAPILLARY     Status: None   Collection Time    11/04/13 11:27 AM      Result Value Ref Range   Glucose-Capillary 99  70 - 99 mg/dL    Ct Abdomen Pelvis W Contrast  11/04/2013   CLINICAL DATA:  Generalized abdominal pain and cramping. White cell count 13.2.  EXAM: CT ABDOMEN AND PELVIS WITH CONTRAST  TECHNIQUE: Multidetector CT imaging of the abdomen and pelvis was performed using the standard protocol following bolus administration of intravenous contrast.  CONTRAST:  16m OMNIPAQUE IOHEXOL 300 MG/ML  SOLN  COMPARISON:  06/23/2013  FINDINGS: Mild dependent changes in the lung bases.  Mild fatty infiltration of the liver. No focal liver lesions. The gallbladder, spleen, pancreas, adrenal glands, kidneys, abdominal aorta, inferior vena cava, and retroperitoneal lymph nodes are unremarkable. Stomach and proximal small bowel are normal. There is a mild fluid distention of the distal jejunum/proximal ileum with a decompression of the distal ileum. No definite wall thickening. Infiltration is present in the mesenteric. Findings may represent partial obstruction or enteritis. Obstruction is favored due to the distal small bowel decompression. Transition zone is in the right lower quadrant. The presence of mesenteric edema may indicate associated inflammatory process. Small bowel ischemia is not excluded although there is no evidence of significant wall thickening or  pneumatosis. Visualized mesenteric artery and vein appear patent. No free air in the abdomen.  Pelvis: Small amount of free fluid in the pelvis is likely to be reactive. Uterus is surgically absent. No pelvic mass or lymphadenopathy. Bladder wall is not thickened. Appendix is normal. No significant lymphadenopathy in the pelvis. No destructive bone lesions.  IMPRESSION: Abnormal distended fluid-filled small bowel in the anterior mid abdomen with mesenteric edema. Distal small bowel is decompressed. Changes likely to represent partial obstruction. Ischemia or enteritis may also be present.   Electronically Signed   By: WLucienne CapersM.D.   On: 11/04/2013 04:10    Review of Systems  All other systems reviewed and are negative.  Blood pressure 109/92, pulse 53, temperature 97.9 F (36.6 C), temperature source Oral, resp. rate 18, height _0  (1.575 m), weight 189 lb 3.2 oz (85.821 kg), SpO2 100.00%. Physical Exam  Constitutional: She is oriented to  person, place, and time. She appears well-developed and well-nourished. She appears distressed.  HENT:  Head: Normocephalic and atraumatic.  Eyes: Conjunctivae and EOM are normal. Pupils are equal, round, and reactive to light. No scleral icterus.  Neck: Normal range of motion. Neck supple. No thyromegaly present.  Cardiovascular: Normal rate, regular rhythm, normal heart sounds and intact distal pulses.  Exam reveals no gallop and no friction rub.   No murmur heard. Respiratory: Effort normal and breath sounds normal. No respiratory distress. She has no wheezes. She has no rales. She exhibits no tenderness.  GI: Soft. Bowel sounds are normal. She exhibits distension. She exhibits no mass. There is no rebound and no guarding.  Tender to epigastric region, no peritoneal signs.  Small infraumbilical scar  Musculoskeletal: Normal range of motion. She exhibits no edema and no tenderness.  Lymphadenopathy:    She has no cervical adenopathy.   Neurological: She is alert and oriented to person, place, and time.  Skin: Skin is warm and dry. No rash noted. She is not diaphoretic. No erythema. No pallor.  Psychiatric: She has a normal mood and affect. Her behavior is normal. Judgment and thought content normal.    Assessment/Plan: Hx abdominal hysterectomy, GYN surgery x4 Hx recurrent small bowel obstructions, last hospitalization 2012 Chronic abdominal pain  (Principal problems) Leukocytosis SBO  Possible enteritis -no indications for urgent surgical intervention. -agree with NGT decompression, bowel rest, IV hydration -repeat films in AM -add zosyn for possible enteritis -repeat CBC in AM -pain control -anti-emetics -encourage ambulation -she may need surgical intervention if no improvement or clinical decline over the next few days.  I discussed this with the patient.  She verbalizes understanding.  Thank you for the consult.  Will follow.   Harel Repetto ANP-BC 11/04/2013, 1:21 PM

## 2013-11-04 NOTE — Progress Notes (Addendum)
ANTIBIOTIC CONSULT NOTE - INITIAL  Pharmacy Consult for zosyn Indication: intra-abdominal infection  Allergies  Allergen Reactions  . Amoxicillin Anaphylaxis  . Strawberry Shortness Of Breath and Swelling  . Ibuprofen Other (See Comments)    Acute renal failure  . Tetracyclines & Related Rash  . Wellbutrin [Bupropion] Rash    Patient Measurements: Height: 5\' 2"  (157.5 cm) Weight: 189 lb 3.2 oz (85.821 kg) IBW/kg (Calculated) : 50.1  Vital Signs: Temp: 97.9 F (36.6 C) (07/28 0545) Temp src: Oral (07/28 0545) BP: 109/92 mmHg (07/28 0545) Pulse Rate: 53 (07/28 0545) Intake/Output from previous day: 07/27 0701 - 07/28 0700 In: 95 [I.V.:95] Out: -  Intake/Output from this shift:    Labs:  Recent Labs  11/04/13 0206  WBC 13.2*  HGB 11.6*  PLT 332  CREATININE 1.06   Estimated Creatinine Clearance: 63.8 ml/min (by C-G formula based on Cr of 1.06). No results found for this basename: VANCOTROUGH, VANCOPEAK, VANCORANDOM, GENTTROUGH, GENTPEAK, GENTRANDOM, TOBRATROUGH, TOBRAPEAK, TOBRARND, AMIKACINPEAK, AMIKACINTROU, AMIKACIN,  in the last 72 hours   Microbiology: No results found for this or any previous visit (from the past 720 hour(s)).  Medical History: Past Medical History  Diagnosis Date  . Bowel obstruction   . Ectopic pregnancy   . Hypertension   . Hypercholesteremia   . Depression   . Sleep disorder   . Borderline diabetic     Medications:  Scheduled:  . enoxaparin (LOVENOX) injection  40 mg Subcutaneous Q24H  . famotidine (PEPCID) IV  20 mg Intravenous Q12H  . insulin aspart  0-9 Units Subcutaneous Q6H  . sertraline  50 mg Oral Daily  . simvastatin  40 mg Oral QPM   Infusions:  . sodium chloride 100 mL/hr at 11/04/13 0603   PRN: clonazePAM, morphine injection, ondansetron (ZOFRAN) IV, ondansetron, zolpidem Assessment: [name] with PMH of abdominal pain, abdominal surgery, bowel obstruction, HTN, depression, diabetes. Presents with c/o abdominal  pain. CT of abdomen shows partial SBO and possible enteritis. Pharmacy consulted to dose Cipro, flagyl for intra-abdominal infection.  Tmax: afebrile WBCs: 13.2 Renal: SCr 1.06 CrCl 7484ml/min  Goal of Therapy:  Appropriate antibiotic dosing for renal function; eradication of infection  Plan:  Start Cipro 400mg  IV Q12H and Flagyl 500mg  IV Q6H  Follow up culture results  Loma BostonLaura Aylanie Cubillos, PharmD Pager: [phone] 11/04/2013 1:18 PM

## 2013-11-04 NOTE — ED Notes (Signed)
Patient c/o generalized abd pain, states it feels like cramping, and is similar to the bowel obstructions she had in the past, most recent obstruction 2012. Patient has taken hycosamine 0.125 at home at 1800 and again at 2330

## 2013-11-04 NOTE — ED Provider Notes (Signed)
Medical screening examination/treatment/procedure(s) were conducted as a shared visit with non-physician practitioner(s) and myself.  I personally evaluated the patient during the encounter.   EKG Interpretation None      Pt with hx of abd surgeries comes in with cc of abd pain - similar to her SBO in the past. CT shows partial SBO. Exam not showing any peritoneal signs. Will admit for partial SBO.  Filed Vitals:   11/04/13 0411  BP: 124/72  Pulse: 78  Temp: 98.5 F (36.9 C)  Resp: 18     Derwood KaplanAnkit Jeanluc Wegman, MD 11/04/13 [phone]

## 2013-11-04 NOTE — Progress Notes (Signed)
[facility] PROGRESS NOTE  [name] ZOX:[id number] DOB: December 17, 1960 DOA: 11/04/2013 PCP: Charlene ApoPOLITE,RONALD D, MD  Assessment/Plan: Patient is seen examined -Pt reports 3 episodes SBO in the past resolved without surgery; currently abd exam benign, mild tender, no rebound, no vomiting, +BS no BM yet; last BM on 7/27; Co2-28, lactic acid 0.5 -continue current regimen; please see H&P for details;    [name], [name]  [facility] Pager [phone]. If 7PM-7AM, please contact night-coverage at [email or web address], password [email or web address] 11/04/2013, 10:10 AM  LOS: 0 days

## 2013-11-04 NOTE — Progress Notes (Signed)
MD given update via phone regarding Pt's status. Pt continues with significant Abdominal cramping pain. Pt restless unable to find comfortable position. Pt now also with c/o moderate to severe burning belching pain in throat area. Surgery to be consulted by Primary MD.

## 2013-11-05 ENCOUNTER — Inpatient Hospital Stay (HOSPITAL_COMMUNITY): Payer: Federal, State, Local not specified - PPO

## 2013-11-05 LAB — GLUCOSE, CAPILLARY
Glucose-Capillary: 113 mg/dL — ABNORMAL HIGH (ref 70–99)
Glucose-Capillary: 156 mg/dL — ABNORMAL HIGH (ref 70–99)
Glucose-Capillary: 89 mg/dL (ref 70–99)
Glucose-Capillary: 88 mg/dL (ref 70–99)

## 2013-11-05 LAB — BASIC METABOLIC PANEL
Anion gap: 10 (ref 5–15)
BUN: 12 mg/dL (ref 6–23)
CO2: 27 mEq/L (ref 19–32)
Calcium: 8.5 mg/dL (ref 8.4–10.5)
Chloride: 101 mEq/L (ref 96–112)
Creatinine, Ser: 0.93 mg/dL (ref 0.50–1.10)
GFR, EST AFRICAN AMERICAN: 81 mL/min — AB (ref >90)
GFR, EST NON AFRICAN AMERICAN: 70 mL/min — AB (ref >90)
Glucose, Bld: 126 mg/dL — ABNORMAL HIGH (ref 70–99)
Potassium: 4.1 mEq/L (ref 3.7–5.3)
SODIUM: 138 meq/L (ref 137–147)

## 2013-11-05 LAB — CBC
HCT: 35.2 % — ABNORMAL LOW (ref 36.0–46.0)
Hemoglobin: 10.7 g/dL — ABNORMAL LOW (ref 12.0–15.0)
MCH: 24.8 pg — ABNORMAL LOW (ref 26.0–34.0)
MCHC: 30.4 g/dL (ref 30.0–36.0)
MCV: 81.5 fL (ref 78.0–100.0)
PLATELETS: 294 10*3/uL (ref 150–400)
RBC: 4.32 MIL/uL (ref 3.87–5.11)
RDW: 14.5 % (ref 11.5–15.5)
WBC: 9.1 10*3/uL (ref 4.0–10.5)

## 2013-11-05 NOTE — Progress Notes (Signed)
Patient ID: [name], female   DOB: 1960/11/13, 53 y.o.   MRN: [id number]  Subjective: Feeling better.  No flatus.  WBC normalized.    Objective:  Vital signs:  Filed Vitals:   11/04/13 0545 11/04/13 1401 11/04/13 2128 11/05/13 0521  BP: 109/92 159/91 132/69 134/74  Pulse: 53 61 73 90  Temp: 97.9 F (36.6 C) 98.2 F (36.8 C) 99 F (37.2 C) 98.9 F (37.2 C)  TempSrc: Oral Oral Oral Oral  Resp: 18 18 18 18   Height: 5' 2"  (1.575 m)     Weight: 189 lb 3.2 oz (85.821 kg)   188 lb 15 oz (85.7 kg)  SpO2: 100% 100% 96% 94%    Last BM Date: 11/03/13  Intake/Output   Yesterday:  07/28 0701 - 07/29 0700 In: 3133.8 [I.V.:2683.8; IV Piggyback:450] Out: 2020 [Urine:1150; Emesis/NG output:870] This shift:   Physical Exam:  General: Pt awake/alert/oriented x4 in no  acute distress Chest: cta.  No chest wall pain w good excursion CV:  Pulses intact.  Regular rhythm Abdomen: Soft.  Nondistended. ttp to epigastric region.  No eviidence of peritonitis.  No incarcerated hernias. Ext:  SCDs BLE.  No mjr edema.  No cyanosis Skin: No petechiae / purpura   Problem List:   Principal Problem:   Partial small bowel obstruction Active Problems:   Enteritis   Diabetes mellitus   Anxiety state, unspecified   Chronic abdominal pain   Essential hypertension, benign    Results:   Labs: Results for orders placed during the hospital encounter of 11/04/13 (from the past 48 hour(s))  CBC WITH DIFFERENTIAL     Status: Abnormal   Collection Time    11/04/13  2:06 AM      Result Value Ref Range   WBC 13.2 (*) 4.0 - 10.5 K/uL   RBC 4.69  3.87 - 5.11 MIL/uL   Hemoglobin 11.6 (*) 12.0 - 15.0 g/dL   HCT 37.4  36.0 - 46.0 %   MCV 79.7  78.0 - 100.0 fL   MCH 24.7 (*) 26.0 - 34.0 pg   MCHC 31.0  30.0 - 36.0 g/dL   RDW 14.3  11.5 - 15.5 %   Platelets 332  150 - 400 K/uL   Neutrophils Relative % 72  43 - 77 %   Neutro Abs 9.5 (*) 1.7 - 7.7 K/uL   Lymphocytes Relative 20  12 - 46 %   Lymphs Abs 2.7  0.7 - 4.0 K/uL   Monocytes Relative 4  3 - 12 %   Monocytes Absolute 0.5  0.1 - 1.0 K/uL   Eosinophils Relative 4  0 - 5 %   Eosinophils Absolute 0.5  0.0 - 0.7 K/uL   Basophils Relative 0  0 - 1 %   Basophils Absolute 0.0  0.0 - 0.1 K/uL  COMPREHENSIVE METABOLIC PANEL     Status: Abnormal   Collection Time    11/04/13  2:06 AM      Result Value Ref Range   Sodium 140  137 - 147 mEq/L   Potassium 4.2  3.7 - 5.3 mEq/L   Chloride 99  96 - 112 mEq/L   CO2 28  19 - 32 mEq/L   Glucose, Bld 120 (*) 70 - 99 mg/dL   BUN 17  6 - 23 mg/dL   Creatinine, Ser 1.06  0.50 - 1.10 mg/dL   Calcium 10.0  8.4 - 10.5 mg/dL   Total Protein 7.8  6.0 - 8.3 g/dL  Albumin 3.6  3.5 - 5.2 g/dL   AST 18  0 - 37 U/L   ALT 14  0 - 35 U/L   Alkaline Phosphatase 102  39 - 117 U/L   Total Bilirubin <0.2 (*) 0.3 - 1.2 mg/dL   GFR calc non Af Amer 60 (*) >90 mL/min   GFR calc Af Amer 69 (*) >90 mL/min   Comment: (NOTE)     The eGFR has been calculated using the CKD EPI equation.     This calculation has not been validated in all clinical situations.     eGFR's persistently <90 mL/min signify possible Chronic Kidney     Disease.   Anion gap 13  5 - 15  LIPASE, BLOOD     Status: None   Collection Time    11/04/13  2:06 AM      Result Value Ref Range   Lipase 24  11 - 59 U/L  SEDIMENTATION RATE     Status: None   Collection Time    11/04/13  2:06 AM      Result Value Ref Range   Sed Rate 18  0 - 22 mm/hr  C-REACTIVE PROTEIN     Status: Abnormal   Collection Time    11/04/13  2:06 AM      Result Value Ref Range   CRP <0.5 (*) <0.60 mg/dL   Comment: Performed at [place]     Status: Abnormal   Collection Time    11/04/13  2:22 AM      Result Value Ref Range   Color, Urine YELLOW  YELLOW   APPearance CLEAR  CLEAR   Specific Gravity, Urine 1.025  1.005 - 1.030   pH 5.0  5.0 - 8.0   Glucose, UA NEGATIVE  NEGATIVE mg/dL   Hgb urine  dipstick MODERATE (*) NEGATIVE   Bilirubin Urine NEGATIVE  NEGATIVE   Ketones, ur NEGATIVE  NEGATIVE mg/dL   Protein, ur NEGATIVE  NEGATIVE mg/dL   Urobilinogen, UA 0.2  0.0 - 1.0 mg/dL   Nitrite NEGATIVE  NEGATIVE   Leukocytes, UA NEGATIVE  NEGATIVE  URINE MICROSCOPIC-ADD ON     Status: None   Collection Time    11/04/13  2:22 AM      Result Value Ref Range   Squamous Epithelial / LPF RARE  RARE   WBC, UA 0-2  <3 WBC/hpf   RBC / HPF 3-6  <3 RBC/hpf   Bacteria, UA RARE  RARE   Urine-Other MUCOUS PRESENT    I-STAT CG4 LACTIC ACID, ED     Status: None   Collection Time    11/04/13  5:13 AM      Result Value Ref Range   Lactic Acid, Venous 0.54  0.5 - 2.2 mmol/L  GLUCOSE, CAPILLARY     Status: Abnormal   Collection Time    11/04/13  6:19 AM      Result Value Ref Range   Glucose-Capillary 100 (*) 70 - 99 mg/dL  GLUCOSE, CAPILLARY     Status: None   Collection Time    11/04/13 11:27 AM      Result Value Ref Range   Glucose-Capillary 99  70 - 99 mg/dL  GLUCOSE, CAPILLARY     Status: Abnormal   Collection Time    11/04/13  5:48 PM      Result Value Ref Range   Glucose-Capillary 123 (*) 70 - 99 mg/dL  GLUCOSE, CAPILLARY  Status: Abnormal   Collection Time    11/05/13 12:01 AM      Result Value Ref Range   Glucose-Capillary 113 (*) 70 - 99 mg/dL  CBC     Status: Abnormal   Collection Time    11/05/13  4:45 AM      Result Value Ref Range   WBC 9.1  4.0 - 10.5 K/uL   RBC 4.32  3.87 - 5.11 MIL/uL   Hemoglobin 10.7 (*) 12.0 - 15.0 g/dL   HCT 35.2 (*) 36.0 - 46.0 %   MCV 81.5  78.0 - 100.0 fL   MCH 24.8 (*) 26.0 - 34.0 pg   MCHC 30.4  30.0 - 36.0 g/dL   RDW 14.5  11.5 - 15.5 %   Platelets 294  150 - 400 K/uL  BASIC METABOLIC PANEL     Status: Abnormal   Collection Time    11/05/13  4:45 AM      Result Value Ref Range   Sodium 138  137 - 147 mEq/L   Potassium 4.1  3.7 - 5.3 mEq/L   Chloride 101  96 - 112 mEq/L   CO2 27  19 - 32 mEq/L   Glucose, Bld 126 (*) 70 - 99  mg/dL   BUN 12  6 - 23 mg/dL   Creatinine, Ser 0.93  0.50 - 1.10 mg/dL   Calcium 8.5  8.4 - 10.5 mg/dL   GFR calc non Af Amer 70 (*) >90 mL/min   GFR calc Af Amer 81 (*) >90 mL/min   Comment: (NOTE)     The eGFR has been calculated using the CKD EPI equation.     This calculation has not been validated in all clinical situations.     eGFR's persistently <90 mL/min signify possible Chronic Kidney     Disease.   Anion gap 10  5 - 15  GLUCOSE, CAPILLARY     Status: Abnormal   Collection Time    11/05/13  5:56 AM      Result Value Ref Range   Glucose-Capillary 156 (*) 70 - 99 mg/dL    Imaging / Studies: Ct Abdomen Pelvis W Contrast  11/04/2013   CLINICAL DATA:  Generalized abdominal pain and cramping. White cell count 13.2.  EXAM: CT ABDOMEN AND PELVIS WITH CONTRAST  TECHNIQUE: Multidetector CT imaging of the abdomen and pelvis was performed using the standard protocol following bolus administration of intravenous contrast.  CONTRAST:  127m OMNIPAQUE IOHEXOL 300 MG/ML  SOLN  COMPARISON:  06/23/2013  FINDINGS: Mild dependent changes in the lung bases.  Mild fatty infiltration of the liver. No focal liver lesions. The gallbladder, spleen, pancreas, adrenal glands, kidneys, abdominal aorta, inferior vena cava, and retroperitoneal lymph nodes are unremarkable. Stomach and proximal small bowel are normal. There is a mild fluid distention of the distal jejunum/proximal ileum with a decompression of the distal ileum. No definite wall thickening. Infiltration is present in the mesenteric. Findings may represent partial obstruction or enteritis. Obstruction is favored due to the distal small bowel decompression. Transition zone is in the right lower quadrant. The presence of mesenteric edema may indicate associated inflammatory process. Small bowel ischemia is not excluded although there is no evidence of significant wall thickening or pneumatosis. Visualized mesenteric artery and vein appear patent. No  free air in the abdomen.  Pelvis: Small amount of free fluid in the pelvis is likely to be reactive. Uterus is surgically absent. No pelvic mass or lymphadenopathy. Bladder wall is not thickened.  Appendix is normal. No significant lymphadenopathy in the pelvis. No destructive bone lesions.  IMPRESSION: Abnormal distended fluid-filled small bowel in the anterior mid abdomen with mesenteric edema. Distal small bowel is decompressed. Changes likely to represent partial obstruction. Ischemia or enteritis may also be present.   Electronically Signed   By: Lucienne Capers M.D.   On: 11/04/2013 04:10   Scheduled Meds: . ciprofloxacin  400 mg Intravenous Q12H  . enoxaparin (LOVENOX) injection  40 mg Subcutaneous Q24H  . famotidine (PEPCID) IV  20 mg Intravenous Q12H  . insulin aspart  0-9 Units Subcutaneous Q6H  . metronidazole  500 mg Intravenous Q6H  . sertraline  50 mg Oral Daily  . simvastatin  40 mg Oral QPM   Continuous Infusions: . sodium chloride 125 mL/hr at 11/05/13 0223   PRN Meds:.clonazePAM, morphine injection, ondansetron (ZOFRAN) IV, ondansetron, phenol, zolpidem   Antibiotics: Anti-infectives   Start     Dose/Rate Route Frequency Ordered Stop   11/04/13 1600  ciprofloxacin (CIPRO) IVPB 400 mg     400 mg 200 mL/hr over 60 Minutes Intravenous Every 12 hours 11/04/13 1333     11/04/13 1500  metroNIDAZOLE (FLAGYL) IVPB 500 mg     500 mg 100 mL/hr over 60 Minutes Intravenous Every 6 hours 11/04/13 1333        Assessment/Plan Hx abdominal hysterectomy, GYN surgery x4  Hx recurrent small bowel obstructions, last hospitalization 2012  Chronic abdominal pain   (Principal problems)  Leukocytosis  SBO  Possible enteritis -no indications for urgent surgical intervention.  Continue with conservative management.  She overall feels better today, abdomen is soft, remains tender to the epigastric region without peritoneal signs.  -await AXR -c/w NGT decompression(868m out bilious),  bowel rest, IV hydration  -continue atbx -pain control  -anti-emetics  -encourage ambulation    EErby Pian AMinneapolis Va Medical CenterSurgery Pager [phone] [phone] 11/05/2013 9:26 AM

## 2013-11-05 NOTE — Consult Note (Signed)
Agree with above.   IV Fluids Antibiotics for possible enteritis.

## 2013-11-05 NOTE — Progress Notes (Signed)
Seen, agree with above.    Feels a little better.  No flatus yet.  Films slightly improved.  Continue NPO/NGT IVF for now.

## 2013-11-05 NOTE — Progress Notes (Signed)
[facility] PROGRESS NOTE  [name] UEA:[id number] DOB: 1961/01/25 DOA: 11/04/2013 PCP: [name]  Assessment/Plan  Partial small bowel obstruction versus enteritis -  Continue ciprofloxacin and Flagyl -  Diet advancement per general surgery -  Continue IV fluids and antiemetics -  Followup x-rays:  Improved.    Diabetes mellitus, CBGs well-controlled -  Continue to hold metformin -  Continue sliding scale insulin  Hypertension/hyperlipidemia, blood pressure stable, continue losartan and simvastatin  Anxiety, uncontrolled, continue Zoloft and clonazepam with Ambien at bedtime  Telemetry demonstrates normal sinus rhythm without significant arrhythmias, okay to DC telemetry  Leukocytosis, resolved, continue antibiotics and  Borderline microcytic anemia -  Iron studies, B12, folate, TSH -  Occult stool  Diet:  N.p.o. Access:  PIV IVF:  Yes Proph:  Lovenox  Code Status: Full code Family Communication: patieint and husband Disposition Plan: Pending tolerating diet   Consultants:  General surgery  Procedures:  CT scan of the abdomen and pelvis  Antibiotics:  Ciprofloxacin from 7/28  Flagyl from 7/28   HPI/Subjective:   states she feels much better, decreased abdominal cramping and pain. Currently denies nausea. No flatness or bowel movements.   Objective: Filed Vitals:   11/04/13 0545 11/04/13 1401 11/04/13 2128 11/05/13 0521  BP: 109/92 159/91 132/69 134/74  Pulse: 53 61 73 90  Temp: 97.9 F (36.6 C) 98.2 F (36.8 C) 99 F (37.2 C) 98.9 F (37.2 C)  TempSrc: Oral Oral Oral Oral  Resp: 18 18 18 18   Height: 5\' 2"  (1.575 m)     Weight: 85.821 kg (189 lb 3.2 oz)   85.7 kg (188 lb 15 oz)  SpO2: 100% 100% 96% 94%    Intake/Output Summary (Last 24 hours) at 11/05/13 0935 Last data filed at 11/05/13 0700  Gross per 24 hour  Intake 3133.75 ml  Output   2020 ml  Net 1113.75 ml   Filed Weights   11/04/13 0545 11/05/13 0521   Weight: 85.821 kg (189 lb 3.2 oz) 85.7 kg (188 lb 15 oz)    Exam:   General:  BF, No acute distress  HEENT:  NCAT, MMM  Cardiovascular:  RRR, nl S1, S2 no mrg, 2+ pulses, warm extremities  Respiratory:  CTAB, no increased WOB  Abdomen:   Positive BS, soft, Diffusely tender to palpation without rebound or guarding, nondistended   MSK:   Normal tone and bulk, no LEE  Neuro:  Grossly intact  Data Reviewed: Basic Metabolic Panel:  Recent Labs Lab 11/04/13 0206 11/05/13 0445  NA 140 138  K 4.2 4.1  CL 99 101  CO2 28 27  GLUCOSE 120* 126*  BUN 17 12  CREATININE 1.06 0.93  CALCIUM 10.0 8.5   Liver Function Tests:  Recent Labs Lab 11/04/13 0206  AST 18  ALT 14  ALKPHOS 102  BILITOT <0.2*  PROT 7.8  ALBUMIN 3.6    Recent Labs Lab 11/04/13 0206  LIPASE 24   No results found for this basename: AMMONIA,  in the last 168 hours CBC:  Recent Labs Lab 11/04/13 0206 11/05/13 0445  WBC 13.2* 9.1  NEUTROABS 9.5*  --   HGB 11.6* 10.7*  HCT 37.4 35.2*  MCV 79.7 81.5  PLT 332 294   Cardiac Enzymes: No results found for this basename: CKTOTAL, CKMB, CKMBINDEX, TROPONINI,  in the last 168 hours BNP (last 3 results) No results found for this basename: PROBNP,  in the last 8760 hours CBG:  Recent Labs Lab 11/04/13 [phone]  11/04/13 1127 11/04/13 1748 11/05/13 0001 11/05/13 0556  GLUCAP 100* 99 123* 113* 156*    No results found for this or any previous visit (from the past 240 hour(s)).   Studies: Ct Abdomen Pelvis W Contrast  11/04/2013   CLINICAL DATA:  Generalized abdominal pain and cramping. White cell count 13.2.  EXAM: CT ABDOMEN AND PELVIS WITH CONTRAST  TECHNIQUE: Multidetector CT imaging of the abdomen and pelvis was performed using the standard protocol following bolus administration of intravenous contrast.  CONTRAST:  OMNIPAQUE IOHEXOL 300 MG/ML  SOLN  COMPARISON:  06/23/2013  FINDINGS: Mild dependent changes in the lung bases.  Mild fatty  infiltration of the liver. No focal liver lesions. The gallbladder, spleen, pancreas, adrenal glands, kidneys, abdominal aorta, inferior vena cava, and retroperitoneal lymph nodes are unremarkable. Stomach and proximal small bowel are normal. There is a mild fluid distention of the distal jejunum/proximal ileum with a decompression of the distal ileum. No definite wall thickening. Infiltration is present in the mesenteric. Findings may represent partial obstruction or enteritis. Obstruction is favored due to the distal small bowel decompression. Transition zone is in the right lower quadrant. The presence of mesenteric edema may indicate associated inflammatory process. Small bowel ischemia is not excluded although there is no evidence of significant wall thickening or pneumatosis. Visualized mesenteric artery and vein appear patent. No free air in the abdomen.  Pelvis: Small amount of free fluid in the pelvis is likely to be reactive. Uterus is surgically absent. No pelvic mass or lymphadenopathy. Bladder wall is not thickened. Appendix is normal. No significant lymphadenopathy in the pelvis. No destructive bone lesions.  IMPRESSION: Abnormal distended fluid-filled small bowel in the anterior mid abdomen with mesenteric edema. Distal small bowel is decompressed. Changes likely to represent partial obstruction. Ischemia or enteritis may also be present.   Electronically Signed   By: Burman Nieves M.D.   On: 11/04/2013 04:10    Scheduled Meds: . ciprofloxacin  400 mg Intravenous Q12H  . enoxaparin (LOVENOX) injection  40 mg Subcutaneous Q24H  . famotidine (PEPCID) IV  20 mg Intravenous Q12H  . insulin aspart  0-9 Units Subcutaneous Q6H  . metronidazole  500 mg Intravenous Q6H  . sertraline  50 mg Oral Daily  . simvastatin  40 mg Oral QPM   Continuous Infusions: . sodium chloride 125 mL/hr at 11/05/13 0223    Principal Problem:   Partial small bowel obstruction Active Problems:   Enteritis    Diabetes mellitus   Anxiety state, unspecified   Chronic abdominal pain   Essential hypertension, benign    Time spent: 30 min    [name], [name]  [facility] Pager [phone]. If 7PM-7AM, please contact night-coverage at [email or web address], password [facility] 11/05/2013, 9:35 AM  LOS: 1 day

## 2013-11-06 LAB — CBC
HEMATOCRIT: 32.1 % — AB (ref 36.0–46.0)
Hemoglobin: 10 g/dL — ABNORMAL LOW (ref 12.0–15.0)
MCH: 24.8 pg — ABNORMAL LOW (ref 26.0–34.0)
MCHC: 31.2 g/dL (ref 30.0–36.0)
MCV: 79.7 fL (ref 78.0–100.0)
PLATELETS: 276 10*3/uL (ref 150–400)
RBC: 4.03 MIL/uL (ref 3.87–5.11)
RDW: 14.4 % (ref 11.5–15.5)
WBC: 7.5 10*3/uL (ref 4.0–10.5)

## 2013-11-06 LAB — BASIC METABOLIC PANEL
Anion gap: 13 (ref 5–15)
BUN: 8 mg/dL (ref 6–23)
CO2: 25 meq/L (ref 19–32)
Calcium: 8.3 mg/dL — ABNORMAL LOW (ref 8.4–10.5)
Chloride: 102 mEq/L (ref 96–112)
Creatinine, Ser: 0.89 mg/dL (ref 0.50–1.10)
GFR calc Af Amer: 86 mL/min — ABNORMAL LOW (ref >90)
GFR calc non Af Amer: 74 mL/min — ABNORMAL LOW (ref >90)
GLUCOSE: 96 mg/dL (ref 70–99)
POTASSIUM: 3.3 meq/L — AB (ref 3.7–5.3)
Sodium: 140 mEq/L (ref 137–147)

## 2013-11-06 LAB — GLUCOSE, CAPILLARY
GLUCOSE-CAPILLARY: 98 mg/dL (ref 70–99)
GLUCOSE-CAPILLARY: 103 mg/dL — AB (ref 70–99)
GLUCOSE-CAPILLARY: 106 mg/dL — AB (ref 70–99)
Glucose-Capillary: 93 mg/dL (ref 70–99)
Glucose-Capillary: 112 mg/dL — ABNORMAL HIGH (ref 70–99)

## 2013-11-06 LAB — VITAMIN B12: Vitamin B-12: 297 pg/mL (ref 211–911)

## 2013-11-06 LAB — IRON AND TIBC
Iron: 19 ug/dL — ABNORMAL LOW (ref 42–135)
SATURATION RATIOS: 8 % — AB (ref 20–55)
TIBC: 233 ug/dL — ABNORMAL LOW (ref 250–470)
UIBC: 214 ug/dL (ref 125–400)

## 2013-11-06 LAB — TRANSFERRIN: TRANSFERRIN: 181 mg/dL — AB (ref 200–360)

## 2013-11-06 LAB — FERRITIN: FERRITIN: 124 ng/mL (ref 10–291)

## 2013-11-06 LAB — FOLATE RBC: RBC FOLATE: 554 ng/mL (ref >280)

## 2013-11-06 LAB — TSH: TSH: 1.95 u[IU]/mL (ref 0.350–4.500)

## 2013-11-06 MED ORDER — HYDRALAZINE HCL 20 MG/ML IJ SOLN: 10.0000 mg | INTRAMUSCULAR | Status: DC | PRN

## 2013-11-06 MED ORDER — POTASSIUM CHLORIDE 10 MEQ/100ML IV SOLN
10.0000 meq | INTRAVENOUS | Status: AC
  Administered 2013-11-06 (×4): 10 meq via INTRAVENOUS
  Filled 2013-11-06 (×4): qty 100

## 2013-11-06 MED ORDER — CYANOCOBALAMIN 1000 MCG/ML IJ SOLN
1000.0000 ug | Freq: Once | INTRAMUSCULAR | Status: AC
  Administered 2013-11-06: 1000 ug via INTRAMUSCULAR
  Filled 2013-11-06: qty 1

## 2013-11-06 NOTE — Progress Notes (Signed)
Pt complains of NG tube twisting or coming up.  With visualization NG tube appears to be straight at back of the throat.  Verification of placement verified with ausculation.  Pt then begins to dry heave.  NG tube connected to wall suction with no output.  Pt states that she cannot tolerate tube if this feeling persists. Dr Malachi BondsShort on floor and orders received to d/c NG tube. Freman Lapage A

## 2013-11-06 NOTE — Progress Notes (Signed)
Pt had formed moderate size BM. NG residual checked with 49 ml return.  Pt stated that she had something to drink around 4p. Myshawn Chiriboga A

## 2013-11-06 NOTE — Progress Notes (Signed)
[facility] PROGRESS NOTE  [name] ZOX:[id number] DOB: 10/11/1960 DOA: 11/04/2013 PCP: Katy ApoPOLITE,RONALD D, MD  Assessment/Plan  Partial small bowel obstruction versus enteritis -  Continue ciprofloxacin and Flagyl, day 3 -  Diet advancement per general surgery -  Continue IV fluids and antiemetics -  Followup x-rays:  Improved.    Diabetes mellitus, CBGs well-controlled -  Continue to hold metformin -  Continue sliding scale insulin  Hypertension/hyperlipidemia, blood pressure mildly elevated  -  Start hydralazine prn until tolerating diet -  Resume simvastatin and ARB pending tolerating diet  Anxiety, uncontrolled,  -  Resume zoloft and clonazepam with Ambien at bedtime  Telemetry demonstrates normal sinus rhythm without significant arrhythmias, tele d/c'd on 7/29  Leukocytosis, resolved, continue antibiotics  Borderline microcytic anemia -  Iron studies c/w iron deficiency, B12 mildly low also, folate pending, TSH wnl -  Occult stool and will need GI follow up -  Start iron supplementation when tolerating PO -  Vitamin B12 injection today, then start 1000mcg po daily once diet advanced  Hypokalemia due to NPO -  IV repletion KCl  Diet:  Per surgery Access:  PIV IVF:  Yes Proph:  Lovenox  Code Status: Full code Family Communication: patieint and husband Disposition Plan: Pending tolerating diet   Consultants:  General surgery  Procedures:  CT scan of the abdomen and pelvis  Antibiotics:  Ciprofloxacin from 7/28  Flagyl from 7/28   HPI/Subjective:  Passing flatus, no BMs.  Objective: Filed Vitals:   11/05/13 0521 11/05/13 1352 11/05/13 2054 11/06/13 0642  BP: 134/74 131/77 159/78 143/80  Pulse: 90 87 67 77  Temp: 98.9 F (37.2 C) 98.6 F (37 C) 99.9 F (37.7 C) 98.6 F (37 C)  TempSrc: Oral Oral Oral Oral  Resp: 18 16 18 18   Height:      Weight: 85.7 kg (188 lb 15 oz)   85.6 kg (188 lb 11.4 oz)  SpO2: 94% 97% 98% 98%     Intake/Output Summary (Last 24 hours) at 11/06/13 1225 Last data filed at 11/06/13 0330  Gross per 24 hour  Intake 1734.17 ml  Output   3250 ml  Net -1515.83 ml   Filed Weights   11/04/13 0545 11/05/13 0521 11/06/13 0642  Weight: 85.821 kg (189 lb 3.2 oz) 85.7 kg (188 lb 15 oz) 85.6 kg (188 lb 11.4 oz)    Exam:   General:  BF, No acute distress  HEENT:  NCAT, MMM  Cardiovascular:  RRR, nl S1, S2 no mrg, 2+ pulses, warm extremities  Respiratory:  CTAB, no increased WOB  Abdomen:   Positive BS, soft, less tender to palpation without rebound or guarding, nondistended   MSK:   Normal tone and bulk, no LEE  Neuro:  Grossly intact  Data Reviewed: Basic Metabolic Panel:  Recent Labs Lab 11/04/13 0206 11/05/13 0445 11/06/13 0420  NA 140 138 140  K 4.2 4.1 3.3*  CL 99 101 102  CO2 28 27 25   GLUCOSE 120* 126* 96  BUN 17 12 8   CREATININE 1.06 0.93 0.89  CALCIUM 10.0 8.5 8.3*   Liver Function Tests:  Recent Labs Lab 11/04/13 0206  AST 18  ALT 14  ALKPHOS 102  BILITOT <0.2*  PROT 7.8  ALBUMIN 3.6    Recent Labs Lab 11/04/13 0206  LIPASE 24   No results found for this basename: AMMONIA,  in the last 168 hours CBC:  Recent Labs Lab 11/04/13 0206 11/05/13 0445 11/06/13 0420  WBC 13.2*  9.1 7.5  NEUTROABS 9.5*  --   --   HGB 11.6* 10.7* 10.0*  HCT 37.4 35.2* 32.1*  MCV 79.7 81.5 79.7  PLT 332 294 276   Cardiac Enzymes: No results found for this basename: CKTOTAL, CKMB, CKMBINDEX, TROPONINI,  in the last 168 hours BNP (last 3 results) No results found for this basename: PROBNP,  in the last 8760 hours CBG:  Recent Labs Lab 11/05/13 0556 11/05/13 1155 11/05/13 1757 11/05/13 2327 11/06/13 0648  GLUCAP 156* 89 88 98 93    No results found for this or any previous visit (from the past 240 hour(s)).   Studies: Dg Abd 2 Views  11/05/2013   CLINICAL DATA:  Small bowel obstruction, upper abdominal pain today  EXAM: ABDOMEN - 2 VIEW   COMPARISON:  11/04/2013 CT scan  FINDINGS: NG tube projects over the stomach. No abnormally dilated loops of bowel. Oral contrast is seen to the colon into the descending colon. There is gas seen as far as the rectosigmoid junction. There are a few air-fluid levels in the central abdomen in small bowel, which does not appear to be significantly distended.  IMPRESSION: Improving incomplete small bowel obstruction   Electronically Signed   By: Esperanza Heir M.D.   On: 11/05/2013 10:07    Scheduled Meds: . ciprofloxacin  400 mg Intravenous Q12H  . cyanocobalamin  1,000 mcg Intramuscular Once  . enoxaparin (LOVENOX) injection  40 mg Subcutaneous Q24H  . famotidine (PEPCID) IV  20 mg Intravenous Q12H  . insulin aspart  0-9 Units Subcutaneous Q6H  . metronidazole  500 mg Intravenous Q6H  . potassium chloride  10 mEq Intravenous Q1 Hr x 4  . sertraline  50 mg Oral Daily  . simvastatin  40 mg Oral QPM   Continuous Infusions: . sodium chloride 75 mL/hr at 11/05/13 1436    Principal Problem:   Partial small bowel obstruction Active Problems:   Enteritis   Diabetes mellitus   Anxiety state, unspecified   Chronic abdominal pain   Essential hypertension, benign    Time spent: 30 min    Grainger Mccarley, [facility]  [facility] Pager [phone]. If 7PM-7AM, please contact night-coverage at [email or web address], password [facility] 11/06/2013, 12:25 PM  LOS: 2 days

## 2013-11-06 NOTE — Progress Notes (Signed)
Seen, agree with above. Improving.  NGT clamped.

## 2013-11-06 NOTE — Progress Notes (Signed)
Patient ID: [name], female   DOB: Oct 02, 1960, 52 y.o.   MRN: [id number]  Subjective: No n/v.  Less abdominal pain.  Ambulating.  Passing flatus.   Objective:  Vital signs:  Filed Vitals:   11/05/13 0521 11/05/13 1352 11/05/13 2054 11/06/13 0642  BP: 134/74 131/77 159/78 143/80  Pulse: 90 87 67 77  Temp: 98.9 F (37.2 C) 98.6 F (37 C) 99.9 F (37.7 C) 98.6 F (37 C)  TempSrc: Oral Oral Oral Oral  Resp: _0 Height:      Weight: 188 lb 15 oz (85.7 kg)   188 lb 11.4 oz (85.6 kg)  SpO2: 94% 97% 98% 98%    Last BM Date: 11/03/13  Intake/Output   Yesterday:  07/29 0701 - 07/30 0700 In: 1884.2 [I.V.:1434.2; IV Piggyback:450] Out: 5361 [Urine:2900; Emesis/NG output:750] This shift:    I/O last 3 completed shifts: In: 3771.7 [I.V.:2871.7; IV WERXVQMGQ:676] Out: 1950 [Urine:3800; Emesis/NG output:1620]    Physical Exam:  General: Pt awake/alert/oriented x4 in no acute distress  Chest: cta. No chest wall pain w good excursion  CV: Pulses intact. Regular rhythm  Abdomen: Soft. Nondistended.mild  ttp to epigastric region. No eviidence of peritonitis. No incarcerated hernias.  Ext: SCDs BLE. No mjr edema. No cyanosis  Skin: No petechiae / purpura   Problem List:   Principal Problem:   Partial small bowel obstruction Active Problems:   Enteritis   Diabetes mellitus   Anxiety state, unspecified   Chronic abdominal pain   Essential hypertension, benign    Results:   Labs: Results for orders placed during the hospital encounter of 11/04/13 (from the past 48 hour(s))  GLUCOSE, CAPILLARY     Status: None   Collection Time    11/04/13 11:27 AM      Result Value Ref Range   Glucose-Capillary 99  70 - 99 mg/dL  GLUCOSE, CAPILLARY     Status: Abnormal   Collection Time    11/04/13  5:48 PM      Result Value Ref Range   Glucose-Capillary 123 (*) 70 - 99 mg/dL  GLUCOSE, CAPILLARY     Status: Abnormal   Collection Time    11/05/13 12:01 AM      Result  Value Ref Range   Glucose-Capillary 113 (*) 70 - 99 mg/dL  CBC     Status: Abnormal   Collection Time    11/05/13  4:45 AM      Result Value Ref Range   WBC 9.1  4.0 - 10.5 K/uL   RBC 4.32  3.87 - 5.11 MIL/uL   Hemoglobin 10.7 (*) 12.0 - 15.0 g/dL   HCT 35.2 (*) 36.0 - 46.0 %   MCV 81.5  78.0 - 100.0 fL   MCH 24.8 (*) 26.0 - 34.0 pg   MCHC 30.4  30.0 - 36.0 g/dL   RDW 14.5  11.5 - 15.5 %   Platelets 294  150 - 400 K/uL  BASIC METABOLIC PANEL     Status: Abnormal   Collection Time    11/05/13  4:45 AM      Result Value Ref Range   Sodium 138  137 - 147 mEq/L   Potassium 4.1  3.7 - 5.3 mEq/L   Chloride 101  96 - 112 mEq/L   CO2 27  19 - 32 mEq/L   Glucose, Bld 126 (*) 70 - 99 mg/dL   BUN 12  6 - 23 mg/dL   Creatinine, Ser 0.93  0.50 - 1.10 mg/dL   Calcium 8.5  8.4 - 10.5 mg/dL   GFR calc non Af Amer 70 (*) >90 mL/min   GFR calc Af Amer 81 (*) >90 mL/min   Comment: (NOTE)     The eGFR has been calculated using the CKD EPI equation.     This calculation has not been validated in all clinical situations.     eGFR's persistently <90 mL/min signify possible Chronic Kidney     Disease.   Anion gap 10  5 - 15  GLUCOSE, CAPILLARY     Status: Abnormal   Collection Time    11/05/13  5:56 AM      Result Value Ref Range   Glucose-Capillary 156 (*) 70 - 99 mg/dL  GLUCOSE, CAPILLARY     Status: None   Collection Time    11/05/13 11:55 AM      Result Value Ref Range   Glucose-Capillary 89  70 - 99 mg/dL  GLUCOSE, CAPILLARY     Status: None   Collection Time    11/05/13  5:57 PM      Result Value Ref Range   Glucose-Capillary 88  70 - 99 mg/dL  GLUCOSE, CAPILLARY     Status: None   Collection Time    11/05/13 11:27 PM      Result Value Ref Range   Glucose-Capillary 98  70 - 99 mg/dL  BASIC METABOLIC PANEL     Status: Abnormal   Collection Time    11/06/13  4:20 AM      Result Value Ref Range   Sodium 140  137 - 147 mEq/L   Potassium 3.3 (*) 3.7 - 5.3 mEq/L   Comment: DELTA  CHECK NOTED     REPEATED TO VERIFY   Chloride 102  96 - 112 mEq/L   CO2 25  19 - 32 mEq/L   Glucose, Bld 96  70 - 99 mg/dL   BUN 8  6 - 23 mg/dL   Creatinine, Ser 0.89  0.50 - 1.10 mg/dL   Calcium 8.3 (*) 8.4 - 10.5 mg/dL   GFR calc non Af Amer 74 (*) >90 mL/min   GFR calc Af Amer 86 (*) >90 mL/min   Comment: (NOTE)     The eGFR has been calculated using the CKD EPI equation.     This calculation has not been validated in all clinical situations.     eGFR's persistently <90 mL/min signify possible Chronic Kidney     Disease.   Anion gap 13  5 - 15  CBC     Status: Abnormal   Collection Time    11/06/13  4:20 AM      Result Value Ref Range   WBC 7.5  4.0 - 10.5 K/uL   RBC 4.03  3.87 - 5.11 MIL/uL   Hemoglobin 10.0 (*) 12.0 - 15.0 g/dL   HCT 32.1 (*) 36.0 - 46.0 %   MCV 79.7  78.0 - 100.0 fL   MCH 24.8 (*) 26.0 - 34.0 pg   MCHC 31.2  30.0 - 36.0 g/dL   RDW 14.4  11.5 - 15.5 %   Platelets 276  150 - 400 K/uL  IRON AND TIBC     Status: Abnormal   Collection Time    11/06/13  4:20 AM      Result Value Ref Range   Iron 19 (*) 42 - 135 ug/dL   TIBC 233 (*) 250 - 470 ug/dL   Saturation Ratios  8 (*) 20 - 55 %   UIBC 214  125 - 400 ug/dL   Comment: Performed at [place]     Status: None   Collection Time    11/06/13  4:20 AM      Result Value Ref Range   Ferritin 124  10 - 291 ng/mL   Comment: Performed at [facility]     Status: Abnormal   Collection Time    11/06/13  4:20 AM      Result Value Ref Range   Transferrin 181 (*) 200 - 360 mg/dL   Comment: Performed at [place]     Status: None   Collection Time    11/06/13  4:20 AM      Result Value Ref Range   Vitamin B-12 297  211 - 911 pg/mL   Comment: Performed at [facility]  TSH     Status: None   Collection Time    11/06/13  4:20 AM      Result Value Ref Range   TSH 1.950  0.350 - 4.500 uIU/mL   Comment: Performed at [place], CAPILLARY     Status: None   Collection Time    11/06/13  6:48 AM      Result Value Ref Range   Glucose-Capillary 93  70 - 99 mg/dL    Imaging / Studies: Dg Abd 2 Views  11/05/2013   CLINICAL DATA:  Small bowel obstruction, upper abdominal pain today  EXAM: ABDOMEN - 2 VIEW  COMPARISON:  11/04/2013 CT scan  FINDINGS: NG tube projects over the stomach. No abnormally dilated loops of bowel. Oral contrast is seen to the colon into the descending colon. There is gas seen as far as the rectosigmoid junction. There are a few air-fluid levels in the central abdomen in small bowel, which does not appear to be significantly distended.  IMPRESSION: Improving incomplete small bowel obstruction   Electronically Signed   By: Skipper Cliche M.D.   On: 11/05/2013 10:07   Scheduled Meds: . ciprofloxacin  400 mg Intravenous Q12H  . cyanocobalamin  1,000 mcg Intramuscular Once  . enoxaparin (LOVENOX) injection  40 mg Subcutaneous Q24H  . famotidine (PEPCID) IV  20 mg Intravenous Q12H  . insulin aspart  0-9 Units Subcutaneous Q6H  . metronidazole  500 mg Intravenous Q6H  . potassium chloride  10 mEq Intravenous Q1 Hr x 4  . sertraline  50 mg Oral Daily  . simvastatin  40 mg Oral QPM   Continuous Infusions: . sodium chloride 75 mL/hr at 11/05/13 1436   PRN Meds:.clonazePAM, hydrALAZINE, morphine injection, ondansetron (ZOFRAN) IV, ondansetron, phenol, zolpidem   Antibiotics: Anti-infectives   Start     Dose/Rate Route Frequency Ordered Stop   11/04/13 1600  ciprofloxacin (CIPRO) IVPB 400 mg     400 mg 200 mL/hr over 60 Minutes Intravenous Every 12 hours 11/04/13 1333     11/04/13 1500  metroNIDAZOLE (FLAGYL) IVPB 500 mg     500 mg 100 mL/hr over 60 Minutes Intravenous Every 6 hours 11/04/13 1333        Assessment/Plan  Hx abdominal hysterectomy, GYN surgery x4  Hx recurrent small bowel obstructions, last hospitalization 2012  Chronic abdominal pain  (Principal problems)   Leukocytosis  SBO  Possible enteritis  -contrast in colon on AXR yesterday.  Will start clamping trial today, give sips of clears -atbx per primary team -  pain control  -anti-emetics  -encourage ambulation   Erby Pian, [facility] Surgery Pager [phone] Office [phone]  11/06/2013 11:04 AM

## 2013-11-06 NOTE — Progress Notes (Signed)
Pt had 11ml with NG residual.  [name]

## 2013-11-07 LAB — CBC
HCT: 32.5 % — ABNORMAL LOW (ref 36.0–46.0)
Hemoglobin: 10 g/dL — ABNORMAL LOW (ref 12.0–15.0)
MCH: 24.8 pg — AB (ref 26.0–34.0)
MCHC: 30.8 g/dL (ref 30.0–36.0)
MCV: 80.4 fL (ref 78.0–100.0)
Platelets: 253 10*3/uL (ref 150–400)
RBC: 4.04 MIL/uL (ref 3.87–5.11)
RDW: 14.2 % (ref 11.5–15.5)
WBC: 10.6 10*3/uL — ABNORMAL HIGH (ref 4.0–10.5)

## 2013-11-07 LAB — BASIC METABOLIC PANEL
Anion gap: 11 (ref 5–15)
BUN: 6 mg/dL (ref 6–23)
CO2: 26 mEq/L (ref 19–32)
Calcium: 8.6 mg/dL (ref 8.4–10.5)
Chloride: 103 mEq/L (ref 96–112)
Creatinine, Ser: 0.88 mg/dL (ref 0.50–1.10)
GFR, EST AFRICAN AMERICAN: 87 mL/min — AB (ref >90)
GFR, EST NON AFRICAN AMERICAN: 75 mL/min — AB (ref >90)
GLUCOSE: 124 mg/dL — AB (ref 70–99)
POTASSIUM: 3.7 meq/L (ref 3.7–5.3)
SODIUM: 140 meq/L (ref 137–147)

## 2013-11-07 LAB — GLUCOSE, CAPILLARY
GLUCOSE-CAPILLARY: 110 mg/dL — AB (ref 70–99)
GLUCOSE-CAPILLARY: 104 mg/dL — AB (ref 70–99)
Glucose-Capillary: 124 mg/dL — ABNORMAL HIGH (ref 70–99)
Glucose-Capillary: 131 mg/dL — ABNORMAL HIGH (ref 70–99)

## 2013-11-07 MED ORDER — LOSARTAN POTASSIUM 50 MG PO TABS
50.0000 mg | ORAL_TABLET | Freq: Every day | ORAL | Status: DC
  Administered 2013-11-07 – 2013-11-08 (×2): 50 mg via ORAL
  Filled 2013-11-07 (×2): qty 1

## 2013-11-07 MED ORDER — METRONIDAZOLE 500 MG PO TABS
500.0000 mg | ORAL_TABLET | Freq: Three times a day (TID) | ORAL | Status: DC
  Filled 2013-11-07 (×3): qty 1

## 2013-11-07 MED ORDER — METRONIDAZOLE 500 MG PO TABS
500.0000 mg | ORAL_TABLET | Freq: Three times a day (TID) | ORAL | Status: DC
  Administered 2013-11-07 – 2013-11-08 (×3): 500 mg via ORAL
  Filled 2013-11-07 (×5): qty 1

## 2013-11-07 MED ORDER — CIPROFLOXACIN HCL 500 MG PO TABS
500.0000 mg | ORAL_TABLET | Freq: Two times a day (BID) | ORAL | Status: DC
  Administered 2013-11-07 – 2013-11-08 (×3): 500 mg via ORAL
  Filled 2013-11-07 (×4): qty 1

## 2013-11-07 NOTE — Progress Notes (Signed)
Agree with the previous RN's assessment. Will continue to monitor. Setzer, Don BroachAllison Marie

## 2013-11-07 NOTE — Progress Notes (Signed)
Advance diet.

## 2013-11-07 NOTE — Progress Notes (Signed)
Patient had BM this am. Small stool, BS remain hypoactive during the night. Tol sips of clear liquids without issues. Told pt not to use straws when taking in liquids as she was becoming progressively distended. Will cont to monitor. Administered Zofran for nausea, tol well. SRP, RN

## 2013-11-07 NOTE — Progress Notes (Signed)
ANTIBIOTIC CONSULT NOTE - Follow Up  Pharmacy Consult for Cipro, Flagyl Indication: intra-abdominal infection  Allergies  Allergen Reactions  . Amoxicillin Anaphylaxis  . Strawberry Shortness Of Breath and Swelling  . Ibuprofen Other (See Comments)    Acute renal failure  . Tetracyclines & Related Rash  . Wellbutrin [Bupropion] Rash    Patient Measurements: Height: 5\' 2"  (157.5 cm) Weight: 188 lb 11.4 oz (85.6 kg) IBW/kg (Calculated) : 50.1  Vital Signs: Temp: 98.6 F (37 C) (07/30 2135) Temp src: Oral (07/30 2135) BP: 152/76 mmHg (07/30 2135) Pulse Rate: 65 (07/30 2135) Intake/Output from previous day: 07/30 0701 - 07/31 0700 In: 3150 [I.V.:1800; IV Piggyback:1350] Out: 2161 [Urine:2100; Emesis/NG output:60; Stool:1] Intake/Output from this shift:    Labs:  Recent Labs  11/05/13 0445 11/06/13 0420 11/07/13 0422  WBC 9.1 7.5 10.6*  HGB 10.7* 10.0* 10.0*  PLT 294 276 253  CREATININE 0.93 0.89 0.88   Estimated Creatinine Clearance: 76.8 ml/min (by C-G formula based on Cr of 0.88). No results found for this basename: VANCOTROUGH, VANCOPEAK, VANCORANDOM, GENTTROUGH, GENTPEAK, GENTRANDOM, TOBRATROUGH, TOBRAPEAK, TOBRARND, AMIKACINPEAK, AMIKACINTROU, AMIKACIN,  in the last 72 hours   Microbiology: No results found for this or any previous visit (from the past 720 hour(s)).  Medical History: Past Medical History  Diagnosis Date  . Bowel obstruction   . Ectopic pregnancy   . Hypertension   . Hypercholesteremia   . Depression   . Sleep disorder   . Borderline diabetic     Medications:  Scheduled:  . ciprofloxacin  400 mg Intravenous Q12H  . enoxaparin (LOVENOX) injection  40 mg Subcutaneous Q24H  . famotidine (PEPCID) IV  20 mg Intravenous Q12H  . insulin aspart  0-9 Units Subcutaneous Q6H  . metronidazole  500 mg Intravenous Q6H  . sertraline  50 mg Oral Daily  . simvastatin  40 mg Oral QPM   Infusions:  . sodium chloride 75 mL/hr at 11/06/13 2237    PRN: clonazePAM, hydrALAZINE, morphine injection, ondansetron (ZOFRAN) IV, ondansetron, phenol, zolpidem Assessment: [age] yoF with PMH of abdominal pain, abdominal surgery, bowel obstruction, HTN, depression, diabetes. Presents with c/o abdominal pain. CT of abdomen shows partial SBO and possible enteritis. Pharmacy consulted to dose Cipro, flagyl for intra-abdominal infection.  7/28 >> Cipro >> 7/28 >> Flagyl >>  Tmax: afebrile WBCs: 10.6, rising Renal: SCr 0.88, stable. CrCl 77  Goal of Therapy:  Appropriate antibiotic dosing for renal function; eradication of infection  Plan:  1) Continue Cipro 400mg  IV q12 2) Consider changing Cipro and Flagyl to PO when appropriate per Md   Hessie KnowsJustin M Darrell Leonhardt, PharmD, BCPS Pager [phone] 11/07/2013 8:10 AM

## 2013-11-07 NOTE — Progress Notes (Signed)
[facility] PROGRESS NOTE  [name] ZOX:[id number] DOB: 1960-08-29 DOA: 11/04/2013 PCP: Katy Apo, MD  Assessment/Plan  Partial small bowel obstruction versus enteritis, NG removed last night due to severe gagging not responding to antiemetics. -  Continue ciprofloxacin and Flagyl, day 4 -  FLD currently and advance as tolerated -  Continue IV fluids and antiemetics -  Followup x-rays:  Improved.    Diabetes mellitus, CBGs well-controlled -  Continue to hold metformin -  Continue sliding scale insulin  Hypertension/hyperlipidemia, blood pressure mildly elevated  -  Start hydralazine prn until tolerating diet -  Resume ARB  Anxiety, uncontrolled,  -  Resume zoloft and clonazepam with Ambien at bedtime  Telemetry demonstrates normal sinus rhythm without significant arrhythmias, tele d/c'd on 7/29  Leukocytosis, mildly elevated today but below 11K  Borderline microcytic anemia, hgb stable -  Iron studies c/w iron deficiency, B12 mildly low also, folate 554, TSH wnl -  Occult stool and will need GI follow up -  Start iron supplementation at discharge -  Vitamin B12 po daily  Hypokalemia due to NPO, resolved  Diet:  Per surgery Access:  PIV IVF:  Yes Proph:  Lovenox  Code Status: Full code Family Communication: patient alone Disposition Plan: Pending tolerating diet, possibly home tomorrow   Consultants:  General surgery  Procedures:  CT scan of the abdomen and pelvis  Antibiotics:  Ciprofloxacin from 7/28  Flagyl from 7/28   HPI/Subjective:  Passing flatus, + 1 BM yesterday and a very large BM this AM.  Severe gagging with NG last night so was removed.    Objective: Filed Vitals:   11/05/13 2054 11/06/13 0642 11/06/13 1323 11/06/13 2135  BP: 159/78 143/80 134/82 152/76  Pulse: 67 77 72 65  Temp: 99.9 F (37.7 C) 98.6 F (37 C) 97.6 F (36.4 C) 98.6 F (37 C)  TempSrc: Oral Oral Oral Oral  Resp: 18 18 16 18   Height:       Weight:  85.6 kg (188 lb 11.4 oz)    SpO2: 98% 98% 100% 100%    Intake/Output Summary (Last 24 hours) at 11/07/13 0942 Last data filed at 11/07/13 8119  Gross per 24 hour  Intake   3210 ml  Output   3311 ml  Net   -101 ml   Filed Weights   11/04/13 0545 11/05/13 0521 11/06/13 0642  Weight: 85.821 kg (189 lb 3.2 oz) 85.7 kg (188 lb 15 oz) 85.6 kg (188 lb 11.4 oz)    Exam:   General:  BF, No acute distress  HEENT:  NCAT, MMM  Cardiovascular:  RRR, nl S1, S2 no mrg, 2+ pulses, warm extremities  Respiratory:  CTAB, no increased WOB  Abdomen:   NABS, soft, ND/NT  MSK:   Normal tone and bulk, no LEE  Neuro:  Grossly intact  Data Reviewed: Basic Metabolic Panel:  Recent Labs Lab 11/04/13 0206 11/05/13 0445 11/06/13 0420 11/07/13 0422  NA 140 138 140 140  K 4.2 4.1 3.3* 3.7  CL 99 101 102 103  CO2 28 27 25 26   GLUCOSE 120* 126* 96 124*  BUN 17 12 8 6   CREATININE 1.06 0.93 0.89 0.88  CALCIUM 10.0 8.5 8.3* 8.6   Liver Function Tests:  Recent Labs Lab 11/04/13 0206  AST 18  ALT 14  ALKPHOS 102  BILITOT <0.2*  PROT 7.8  ALBUMIN 3.6    Recent Labs Lab 11/04/13 0206  LIPASE 24   No results found for  this basename: AMMONIA,  in the last 168 hours CBC:  Recent Labs Lab 11/04/13 0206 11/05/13 0445 11/06/13 0420 11/07/13 0422  WBC 13.2* 9.1 7.5 10.6*  NEUTROABS 9.5*  --   --   --   HGB 11.6* 10.7* 10.0* 10.0*  HCT 37.4 35.2* 32.1* 32.5*  MCV 79.7 81.5 79.7 80.4  PLT 332 294 276 253   Cardiac Enzymes: No results found for this basename: CKTOTAL, CKMB, CKMBINDEX, TROPONINI,  in the last 168 hours BNP (last 3 results) No results found for this basename: PROBNP,  in the last 8760 hours CBG:  Recent Labs Lab 11/06/13 0648 11/06/13 1149 11/06/13 1813 11/06/13 2321 11/07/13 0617  GLUCAP 93 112* 103* 106* 124*    No results found for this or any previous visit (from the past 240 hour(s)).   Studies: Dg Abd 2 Views  11/05/2013   CLINICAL  DATA:  Small bowel obstruction, upper abdominal pain today  EXAM: ABDOMEN - 2 VIEW  COMPARISON:  11/04/2013 CT scan  FINDINGS: NG tube projects over the stomach. No abnormally dilated loops of bowel. Oral contrast is seen to the colon into the descending colon. There is gas seen as far as the rectosigmoid junction. There are a few air-fluid levels in the central abdomen in small bowel, which does not appear to be significantly distended.  IMPRESSION: Improving incomplete small bowel obstruction   Electronically Signed   By: Esperanza Heiraymond  Rubner M.D.   On: 11/05/2013 10:07    Scheduled Meds: . ciprofloxacin  400 mg Intravenous Q12H  . enoxaparin (LOVENOX) injection  40 mg Subcutaneous Q24H  . famotidine (PEPCID) IV  20 mg Intravenous Q12H  . insulin aspart  0-9 Units Subcutaneous Q6H  . metronidazole  500 mg Intravenous Q6H  . sertraline  50 mg Oral Daily  . simvastatin  40 mg Oral QPM   Continuous Infusions: . sodium chloride 75 mL/hr at 11/06/13 2237    Principal Problem:   Partial small bowel obstruction Active Problems:   Enteritis   Diabetes mellitus   Anxiety state, unspecified   Chronic abdominal pain   Essential hypertension, benign    Time spent: 30 min    [name], [facility]Marcellis Frampton  [facility] Pager [phone]. If 7PM-7AM, please contact night-coverage at [email or web address], password [email or web address] 11/07/2013, 9:42 AM  LOS: 3 days

## 2013-11-07 NOTE — Progress Notes (Signed)
Patient ID: [name], female   DOB: [birth year], 53 y.o.   MRN: [id number]   Subjective:  No pain, no n/v.  Had 3 BMs.    Objective:  Vital signs:  Filed Vitals:   11/05/13 2054 11/06/13 0642 11/06/13 1323 11/06/13 2135  BP: 159/78 143/80 134/82 152/76  Pulse: 67 77 72 65  Temp: 99.9 F (37.7 C) 98.6 F (37 C) 97.6 F (36.4 C) 98.6 F (37 C)  TempSrc: Oral Oral Oral Oral  Resp: _0 Height:      Weight:  188 lb 11.4 oz (85.6 kg)    SpO2: 98% 98% 100% 100%    Last BM Date: 11/06/13  Intake/Output   Yesterday:  07/30 0701 - 07/31 0700 In: 3150 [I.V.:1800; IV Piggyback:1350] Out: 2161 [Urine:2100; Emesis/NG output:60; Stool:1] This shift:    I/O last 3 completed shifts: In: 8416 [I.V.:2100; IV Piggyback:1350] Out: 4161 [Urine:3800; Emesis/NG output:360; Stool:1] Total I/O In: 9 [P.O.:60] Out: 6063 [Urine:1150; Stool:1]    Physical Exam:  General: Pt awake/alert/oriented x4 in no acute distress  Chest: cta. No chest wall pain w good excursion  CV: Pulses intact. Regular rhythm  Abdomen: Soft. Nondistended.mild ttp to epigastric region. No eviidence of peritonitis. No incarcerated hernias.  Ext: SCDs BLE. No mjr edema. No cyanosis  Skin: No petechiae / purpura    Problem List:   Principal Problem:   Partial small bowel obstruction Active Problems:   Enteritis   Diabetes mellitus   Anxiety state, unspecified   Chronic abdominal pain   Essential hypertension, benign    Results:   Labs: Results for orders placed during the hospital encounter of 11/04/13 (from the past 48 hour(s))  GLUCOSE, CAPILLARY     Status: None   Collection Time    11/05/13 11:55 AM      Result Value Ref Range   Glucose-Capillary 89  70 - 99 mg/dL  GLUCOSE, CAPILLARY     Status: None   Collection Time    11/05/13  5:57 PM      Result Value Ref Range   Glucose-Capillary 88  70 - 99 mg/dL  GLUCOSE, CAPILLARY     Status: None   Collection Time    11/05/13 11:27  PM      Result Value Ref Range   Glucose-Capillary 98  70 - 99 mg/dL  BASIC METABOLIC PANEL     Status: Abnormal   Collection Time    11/06/13  4:20 AM      Result Value Ref Range   Sodium 140  137 - 147 mEq/L   Potassium 3.3 (*) 3.7 - 5.3 mEq/L   Comment: DELTA CHECK NOTED     REPEATED TO VERIFY   Chloride 102  96 - 112 mEq/L   CO2 25  19 - 32 mEq/L   Glucose, Bld 96  70 - 99 mg/dL   BUN 8  6 - 23 mg/dL   Creatinine, Ser 0.89  0.50 - 1.10 mg/dL   Calcium 8.3 (*) 8.4 - 10.5 mg/dL   GFR calc non Af Amer 74 (*) >90 mL/min   GFR calc Af Amer 86 (*) >90 mL/min   Comment: (NOTE)     The eGFR has been calculated using the CKD EPI equation.     This calculation has not been validated in all clinical situations.     eGFR's persistently <90 mL/min signify possible Chronic Kidney     Disease.   Anion gap 13  5 -  15  CBC     Status: Abnormal   Collection Time    11/06/13  4:20 AM      Result Value Ref Range   WBC 7.5  4.0 - 10.5 K/uL   RBC 4.03  3.87 - 5.11 MIL/uL   Hemoglobin 10.0 (*) 12.0 - 15.0 g/dL   HCT 32.1 (*) 36.0 - 46.0 %   MCV 79.7  78.0 - 100.0 fL   MCH 24.8 (*) 26.0 - 34.0 pg   MCHC 31.2  30.0 - 36.0 g/dL   RDW 14.4  11.5 - 15.5 %   Platelets 276  150 - 400 K/uL  IRON AND TIBC     Status: Abnormal   Collection Time    11/06/13  4:20 AM      Result Value Ref Range   Iron 19 (*) 42 - 135 ug/dL   TIBC 233 (*) 250 - 470 ug/dL   Saturation Ratios 8 (*) 20 - 55 %   UIBC 214  125 - 400 ug/dL   Comment: Performed at [place]     Status: None   Collection Time    11/06/13  4:20 AM      Result Value Ref Range   Ferritin 124  10 - 291 ng/mL   Comment: Performed at [place]     Status: Abnormal   Collection Time    11/06/13  4:20 AM      Result Value Ref Range   Transferrin 181 (*) 200 - 360 mg/dL   Comment: Performed at [place] RBC     Status: None   Collection Time    11/06/13  4:20 AM      Result  Value Ref Range   RBC Folate 554  >280 ng/mL   Comment: Reference range not established for pediatric patients.     Performed at [place]     Status: None   Collection Time    11/06/13  4:20 AM      Result Value Ref Range   Vitamin B-12 297  211 - 911 pg/mL   Comment: Performed at [facility]  TSH     Status: None   Collection Time    11/06/13  4:20 AM      Result Value Ref Range   TSH 1.950  0.350 - 4.500 uIU/mL   Comment: Performed at [place], CAPILLARY     Status: None   Collection Time    11/06/13  6:48 AM      Result Value Ref Range   Glucose-Capillary 93  70 - 99 mg/dL  GLUCOSE, CAPILLARY     Status: Abnormal   Collection Time    11/06/13 11:49 AM      Result Value Ref Range   Glucose-Capillary 112 (*) 70 - 99 mg/dL  GLUCOSE, CAPILLARY     Status: Abnormal   Collection Time    11/06/13  6:13 PM      Result Value Ref Range   Glucose-Capillary 103 (*) 70 - 99 mg/dL  GLUCOSE, CAPILLARY     Status: Abnormal   Collection Time    11/06/13 11:21 PM      Result Value Ref Range   Glucose-Capillary 106 (*) 70 - 99 mg/dL  BASIC METABOLIC PANEL     Status: Abnormal   Collection Time    11/07/13  4:22 AM  Result Value Ref Range   Sodium 140  137 - 147 mEq/L   Potassium 3.7  3.7 - 5.3 mEq/L   Chloride 103  96 - 112 mEq/L   CO2 26  19 - 32 mEq/L   Glucose, Bld 124 (*) 70 - 99 mg/dL   BUN 6  6 - 23 mg/dL   Creatinine, Ser 0.88  0.50 - 1.10 mg/dL   Calcium 8.6  8.4 - 10.5 mg/dL   GFR calc non Af Amer 75 (*) >90 mL/min   GFR calc Af Amer 87 (*) >90 mL/min   Comment: (NOTE)     The eGFR has been calculated using the CKD EPI equation.     This calculation has not been validated in all clinical situations.     eGFR's persistently <90 mL/min signify possible Chronic Kidney     Disease.   Anion gap 11  5 - 15  CBC     Status: Abnormal   Collection Time    11/07/13  4:22 AM      Result Value Ref Range   WBC 10.6 (*)  4.0 - 10.5 K/uL   RBC 4.04  3.87 - 5.11 MIL/uL   Hemoglobin 10.0 (*) 12.0 - 15.0 g/dL   HCT 32.5 (*) 36.0 - 46.0 %   MCV 80.4  78.0 - 100.0 fL   MCH 24.8 (*) 26.0 - 34.0 pg   MCHC 30.8  30.0 - 36.0 g/dL   RDW 14.2  11.5 - 15.5 %   Platelets 253  150 - 400 K/uL  GLUCOSE, CAPILLARY     Status: Abnormal   Collection Time    11/07/13  6:17 AM      Result Value Ref Range   Glucose-Capillary 124 (*) 70 - 99 mg/dL    Imaging / Studies: Dg Abd 2 Views  11/05/2013   CLINICAL DATA:  Small bowel obstruction, upper abdominal pain today  EXAM: ABDOMEN - 2 VIEW  COMPARISON:  11/04/2013 CT scan  FINDINGS: NG tube projects over the stomach. No abnormally dilated loops of bowel. Oral contrast is seen to the colon into the descending colon. There is gas seen as far as the rectosigmoid junction. There are a few air-fluid levels in the central abdomen in small bowel, which does not appear to be significantly distended.  IMPRESSION: Improving incomplete small bowel obstruction   Electronically Signed   By: Skipper Cliche M.D.   On: 11/05/2013 10:07    Scheduled Meds: . ciprofloxacin  500 mg Oral BID  . enoxaparin (LOVENOX) injection  40 mg Subcutaneous Q24H  . famotidine (PEPCID) IV  20 mg Intravenous Q12H  . insulin aspart  0-9 Units Subcutaneous Q6H  . losartan  50 mg Oral Daily  . metroNIDAZOLE  500 mg Oral TID  . sertraline  50 mg Oral Daily  . simvastatin  40 mg Oral QPM   Continuous Infusions: . sodium chloride 75 mL/hr at 11/06/13 2237   PRN Meds:.clonazePAM, hydrALAZINE, morphine injection, ondansetron (ZOFRAN) IV, ondansetron, phenol, zolpidem   Antibiotics: Anti-infectives   Start     Dose/Rate Route Frequency Ordered Stop   11/04/13 1600  ciprofloxacin (CIPRO) IVPB 400 mg     400 mg 200 mL/hr over 60 Minutes Intravenous Every 12 hours 11/04/13 1333     11/04/13 1500  metroNIDAZOLE (FLAGYL) IVPB 500 mg     500 mg 100 mL/hr over 60 Minutes Intravenous Every 6 hours 11/04/13 1333         Assessment/Plan  Hx abdominal hysterectomy, GYN surgery x4  Hx recurrent small bowel obstructions, last hospitalization 2012  Chronic abdominal pain  (Principal problems)  Leukocytosis  SBO  Possible enteritis  -resolved SBO, advance to full liquid diet, advance as tolerated -atbx per primary team  -pain control  -anti-emetics  -encourage ambulation   Erby Pian, [facility] Surgery Pager [phone] Office [phone]  11/07/2013 10:56 AM

## 2013-11-08 LAB — CBC
HCT: 30.5 % — ABNORMAL LOW (ref 36.0–46.0)
Hemoglobin: 9.7 g/dL — ABNORMAL LOW (ref 12.0–15.0)
MCH: 25.3 pg — ABNORMAL LOW (ref 26.0–34.0)
MCHC: 31.8 g/dL (ref 30.0–36.0)
MCV: 79.4 fL (ref 78.0–100.0)
PLATELETS: 289 10*3/uL (ref 150–400)
RBC: 3.84 MIL/uL — ABNORMAL LOW (ref 3.87–5.11)
RDW: 14.2 % (ref 11.5–15.5)
WBC: 10 10*3/uL (ref 4.0–10.5)

## 2013-11-08 LAB — BASIC METABOLIC PANEL
ANION GAP: 10 (ref 5–15)
BUN: 6 mg/dL (ref 6–23)
CALCIUM: 8.6 mg/dL (ref 8.4–10.5)
CO2: 27 meq/L (ref 19–32)
Chloride: 103 mEq/L (ref 96–112)
Creatinine, Ser: 0.85 mg/dL (ref 0.50–1.10)
GFR calc non Af Amer: 78 mL/min — ABNORMAL LOW (ref >90)
Glucose, Bld: 134 mg/dL — ABNORMAL HIGH (ref 70–99)
Potassium: 3.6 mEq/L — ABNORMAL LOW (ref 3.7–5.3)
Sodium: 140 mEq/L (ref 137–147)

## 2013-11-08 LAB — GLUCOSE, CAPILLARY: GLUCOSE-CAPILLARY: 100 mg/dL — AB (ref 70–99)

## 2013-11-08 MED ORDER — FERROUS SULFATE 325 (65 FE) MG PO TBEC: 325.0000 mg | DELAYED_RELEASE_TABLET | Freq: Three times a day (TID) | ORAL | Status: DC

## 2013-11-08 MED ORDER — INSULIN ASPART 100 UNIT/ML ~~LOC~~ SOLN
0.0000 [IU] | Freq: Three times a day (TID) | SUBCUTANEOUS | Status: DC
Start: 2013-11-08 — End: 2013-11-08

## 2013-11-08 MED ORDER — VITAMIN B-12 100 MCG PO TABS: 100.0000 ug | ORAL_TABLET | Freq: Every day | ORAL | Status: DC

## 2013-11-08 MED ORDER — CIPROFLOXACIN HCL 500 MG PO TABS: 500.0000 mg | ORAL_TABLET | Freq: Two times a day (BID) | ORAL | Status: DC

## 2013-11-08 MED ORDER — METRONIDAZOLE 500 MG PO TABS: 500.0000 mg | ORAL_TABLET | Freq: Three times a day (TID) | ORAL | Status: DC

## 2013-11-08 NOTE — Discharge Summary (Signed)
Physician Discharge Summary  LIAT MAYOL ZOX:[id number] DOB: Dec 28, 1960 DOA: 11/04/2013  PCP: Katy Apo, MD  Admit date: 11/04/2013 Discharge date: 11/08/2013  Recommendations for Outpatient Follow-up:  1. Follow up with general surgery as needed.  Advised to keep food journal.   2. F/u with PCP in 1 month for review of anemia  Discharge Diagnoses:  Principal Problem:   Partial small bowel obstruction Active Problems:   Enteritis   Diabetes mellitus   Anxiety state, unspecified   Chronic abdominal pain   Essential hypertension, benign   Discharge Condition: stable, improved  Diet recommendation: regular  Wt Readings from Last 3 Encounters:  11/08/13 84.052 kg (185 lb 4.8 oz)  06/04/13 86.456 kg (190 lb 9.6 oz)  10/22/12 82.464 kg (181 lb 12.8 oz)    History of present illness:   The patient is a 53 yo F with past medical history of abdominal surgeries, bowel obstruction, hypertension, diabetes, depression. She presented with abdominal pain and without symptoms of infection. Her abdominal pain was cramping and throbbing like a muscle contraction but she did not have any diarrhea or constipation.  CT demonstrated a small bowel obstruction.  Hospital Course:   Partial small bowel obstruction versus enteritis. She had an NG tube placed to low intermittent suction which improved her nausea. She was seen by general surgery to recommended starting antibiotics for possible enteritis seen on CAT scan.  She was given IV fluids and antiemetics with IV ciprofloxacin and Flagyl. She quickly improved and at the time of discharge she is tolerating a regular diet. She is having multiple bowel movements. Her repeat abdominal x-rays demonstrated improvement. General surgeon felt that perhaps she had a bezoar which contributed to her bowel obstruction and advised her to keep a food journal particularly of high fiber foods. She should follow up with her primary care doctor in one to 2 weeks and  with general surgery as needed.  Will continue antibiotics for enteritis for total of 7 days (2 more days).   Diabetes mellitus, CBGs well controlled on sliding scale insulin in the hospital. She may resume her metformin.  Hypertension/hyperlipidemia, blood pressure mildly elevated. Her oral blood pressure medications were initially held because of her NG tube, however she may resume them now.  Anxiety, uncontrolled, continued Zoloft, clonazepam, and when necessary Ambien.  Leukocytosis, likely related to bowel obstruction and improved with IV fluids and antibiotics.  Borderline microcytic anemia, hemoglobin remained stable around 10. Her iron studies were consistent with iron deficiency vs. inflammation. Her vitamin B12 level was borderline low also at 297. Her folate was normal as was her TSH. Occult stool was not obtained during this admission. She was started on iron and vitamin B12 supplementation. She should have repeat CBC done as an outpatient by her primary care doctor in approximately 4 weeks to ensure improvement in her hemoglobin.    Hypokalemia due to poor oral intake from small bowel obstruction. She was given oral potassium repletion. This should resolve as she resumes her regular diet.    Consultants:  General surgery Procedures:  CT scan of the abdomen and pelvis Antibiotics:  Ciprofloxacin from 7/28  Flagyl from 7/28    Discharge Exam: Filed Vitals:   11/08/13 0643  BP: 143/72  Pulse: 65  Temp: 98.2 F (36.8 C)  Resp: 18   Filed Vitals:   11/06/13 2135 11/07/13 1245 11/07/13 2212 11/08/13 0643  BP: 152/76 132/67 132/68 143/72  Pulse: 65 63 68 65  Temp: 98.6 F (37  C) 98.8 F (37.1 C) 98.7 F (37.1 C) 98.2 F (36.8 C)  TempSrc: Oral Oral Oral Oral  Resp: 18 16 18 18   Height:      Weight:    84.052 kg (185 lb 4.8 oz)  SpO2: 100% 100% 100% 100%   still passing gas, no BM this morning but feeling well and tolerating diet   General: BF, No acute distress,  stable from prior   HEENT: NCAT, MMM  Cardiovascular: RRR, nl S1, S2 no mrg, 2+ pulses, warm extremities  Respiratory: CTAB, no increased WOB  Abdomen: NABS, soft, ND/NT  MSK: Normal tone and bulk, no LEE  Neuro: Grossly intact   Discharge Instructions      Discharge Instructions   Call MD for:  difficulty breathing, headache or visual disturbances    Complete by:  As directed      Call MD for:  extreme fatigue    Complete by:  As directed      Call MD for:  hives    Complete by:  As directed      Call MD for:  persistant dizziness or light-headedness    Complete by:  As directed      Call MD for:  persistant nausea and vomiting    Complete by:  As directed      Call MD for:  severe uncontrolled pain    Complete by:  As directed      Call MD for:  temperature >100.4    Complete by:  As directed      Diet general    Complete by:  As directed      Discharge instructions    Complete by:  As directed   You were hospitalized with small bowel obstruction which may have been from popcorn.  Please keep a food journal to determine if there are specific foods which may trigger irritation or blockage.  There were some areas of irritated bowels on your CT scan which may have been due to obstruction or minor infection.  Please take ciprofloxacin and metronidazole for the next two days or until all the doses are complete.  Your next dose of metronidazole is due mid-day today and your next dose of ciprofloxacin is due this evening.  You have mild anemia may have some borderline iron and vitamin B12 deficiency.  Please take iron and B12 supplements for the next month and have your primary care doctor check your blood count in about 1 month to make sure your counts are improving.     Increase activity slowly    Complete by:  As directed             Medication List         ciprofloxacin 500 MG tablet  Commonly known as:  CIPRO  Take 1 tablet (500 mg total) by mouth 2 (two) times daily.      clonazePAM 0.5 MG tablet  Commonly known as:  KLONOPIN  Take 0.5 mg by mouth at bedtime as needed for anxiety.     ferrous sulfate 325 (65 FE) MG EC tablet  Take 1 tablet (325 mg total) by mouth 3 (three) times daily with meals.     hyoscyamine 0.125 MG SL tablet  Commonly known as:  LEVSIN SL  Place 1 tablet (0.125 mg total) under the tongue every 4 (four) hours as needed for cramping.     losartan 50 MG tablet  Commonly known as:  COZAAR  Take 50 mg by  mouth daily.     metFORMIN 500 MG tablet  Commonly known as:  GLUCOPHAGE  Take 500 mg by mouth 2 (two) times daily with a meal.     metroNIDAZOLE 500 MG tablet  Commonly known as:  FLAGYL  Take 1 tablet (500 mg total) by mouth 3 (three) times daily.     sertraline 50 MG tablet  Commonly known as:  ZOLOFT  Take 50 mg by mouth daily.     simvastatin 40 MG tablet  Commonly known as:  ZOCOR  Take 40 mg by mouth every evening.     vitamin B-12 100 MCG tablet  Commonly known as:  CYANOCOBALAMIN  Take 1 tablet (100 mcg total) by mouth daily.     zolpidem 5 MG tablet  Commonly known as:  AMBIEN  Take 5 mg by mouth at bedtime as needed for sleep.       Follow-up Information   Follow up with POLITE,RONALD D, MD. Schedule an appointment as soon as possible for a visit in 1 month.   Specialty:  Internal Medicine   Contact information:   [address] [place] [address] [phone]       Follow up with [facility], MD. Schedule an appointment as soon as possible for a visit in 2 weeks. (As needed)    Specialty:  General Surgery   Contact information:   [address] [place] [address] [phone]        The results of significant diagnostics from this hospitalization (including imaging, microbiology, ancillary and laboratory) are listed below for reference.    Significant Diagnostic Studies: Ct Abdomen Pelvis W Contrast  11/04/2013   CLINICAL DATA:  Generalized abdominal  pain and cramping. White cell count 13.2.  EXAM: CT ABDOMEN AND PELVIS WITH CONTRAST  TECHNIQUE: Multidetector CT imaging of the abdomen and pelvis was performed using the standard protocol following bolus administration of intravenous contrast.  CONTRAST:  OMNIPAQUE IOHEXOL 300 MG/ML  SOLN  COMPARISON:  06/23/2013  FINDINGS: Mild dependent changes in the lung bases.  Mild fatty infiltration of the liver. No focal liver lesions. The gallbladder, spleen, pancreas, adrenal glands, kidneys, abdominal aorta, inferior vena cava, and retroperitoneal lymph nodes are unremarkable. Stomach and proximal small bowel are normal. There is a mild fluid distention of the distal jejunum/proximal ileum with a decompression of the distal ileum. No definite wall thickening. Infiltration is present in the mesenteric. Findings may represent partial obstruction or enteritis. Obstruction is favored due to the distal small bowel decompression. Transition zone is in the right lower quadrant. The presence of mesenteric edema may indicate associated inflammatory process. Small bowel ischemia is not excluded although there is no evidence of significant wall thickening or pneumatosis. Visualized mesenteric artery and vein appear patent. No free air in the abdomen.  Pelvis: Small amount of free fluid in the pelvis is likely to be reactive. Uterus is surgically absent. No pelvic mass or lymphadenopathy. Bladder wall is not thickened. Appendix is normal. No significant lymphadenopathy in the pelvis. No destructive bone lesions.  IMPRESSION: Abnormal distended fluid-filled small bowel in the anterior mid abdomen with mesenteric edema. Distal small bowel is decompressed. Changes likely to represent partial obstruction. Ischemia or enteritis may also be present.   Electronically Signed   By: Burman Nieves M.D.   On: 11/04/2013 04:10   Dg Abd 2 Views  11/05/2013   CLINICAL DATA:  Small bowel obstruction, upper abdominal pain today  EXAM:  ABDOMEN - 2 VIEW  COMPARISON:  11/04/2013 CT scan  FINDINGS: NG tube projects over the stomach. No abnormally dilated loops of bowel. Oral contrast is seen to the colon into the descending colon. There is gas seen as far as the rectosigmoid junction. There are a few air-fluid levels in the central abdomen in small bowel, which does not appear to be significantly distended.  IMPRESSION: Improving incomplete small bowel obstruction   Electronically Signed   By: Esperanza Heiraymond  Rubner M.D.   On: 11/05/2013 10:07    Microbiology: No results found for this or any previous visit (from the past 240 hour(s)).   Labs: Basic Metabolic Panel:  Recent Labs Lab 11/04/13 0206 11/05/13 0445 11/06/13 0420 11/07/13 0422 11/08/13 0430  NA 140 138 140 140 140  K 4.2 4.1 3.3* 3.7 3.6*  CL 99 101 102 103 103  CO2 28 27 25 26 27   GLUCOSE 120* 126* 96 124* 134*  BUN 17 12 8 6 6   CREATININE 1.06 0.93 0.89 0.88 0.85  CALCIUM 10.0 8.5 8.3* 8.6 8.6   Liver Function Tests:  Recent Labs Lab 11/04/13 0206  AST 18  ALT 14  ALKPHOS 102  BILITOT <0.2*  PROT 7.8  ALBUMIN 3.6    Recent Labs Lab 11/04/13 0206  LIPASE 24   No results found for this basename: AMMONIA,  in the last 168 hours CBC:  Recent Labs Lab 11/04/13 0206 11/05/13 0445 11/06/13 0420 11/07/13 0422 11/08/13 0430  WBC 13.2* 9.1 7.5 10.6* 10.0  NEUTROABS 9.5*  --   --   --   --   HGB 11.6* 10.7* 10.0* 10.0* 9.7*  HCT 37.4 35.2* 32.1* 32.5* 30.5*  MCV 79.7 81.5 79.7 80.4 79.4  PLT 332 294 276 253 289   Cardiac Enzymes: No results found for this basename: CKTOTAL, CKMB, CKMBINDEX, TROPONINI,  in the last 168 hours BNP: BNP (last 3 results) No results found for this basename: PROBNP,  in the last 8760 hours CBG:  Recent Labs Lab 11/07/13 0617 11/07/13 1154 11/07/13 1727 11/07/13 2345 11/08/13 0642  GLUCAP 124* 110* 104* 131* 100*    Time coordinating discharge: 35 minutes  Signed:  Nadelyn Enriques  [facility] 11/08/2013, 10:30 AM

## 2013-11-08 NOTE — Progress Notes (Signed)
Patient ID: [name], female   DOB: 1960-08-10, 53 y.o.   MRN: [id number] [facility] Surgery Progress Note:   * No surgery found *  Subjective: Mental status is clear.  Eating and having flatus Objective: Vital signs in last 24 hours: Temp:  [98.2 F (36.8 C)-98.8 F (37.1 C)] 98.2 F (36.8 C) (08/01 0643) Pulse Rate:  [63-68] 65 (08/01 0643) Resp:  [16-18] 18 (08/01 0643) BP: (132-143)/(67-72) 143/72 mmHg (08/01 0643) SpO2:  [100 %] 100 % (08/01 0643) Weight:  [185 lb 4.8 oz (84.052 kg)] 185 lb 4.8 oz (84.052 kg) (08/01 0643)  Intake/Output from previous day: 07/31 0701 - 08/01 0700 In: 2023.8 [P.O.:60; I.V.:1763.8; IV Piggyback:200] Out: 3401 [Urine:3400; Stool:1] Intake/Output this shift: Total I/O In: 120 [P.O.:120] Out: 100 [Urine:100]  Physical Exam: Work of breathing is normal.  No abdominal pain  Lab Results:  Results for orders placed during the hospital encounter of 11/04/13 (from the past 48 hour(s))  GLUCOSE, CAPILLARY     Status: Abnormal   Collection Time    11/06/13 11:49 AM      Result Value Ref Range   Glucose-Capillary 112 (*) 70 - 99 mg/dL  GLUCOSE, CAPILLARY     Status: Abnormal   Collection Time    11/06/13  6:13 PM      Result Value Ref Range   Glucose-Capillary 103 (*) 70 - 99 mg/dL  GLUCOSE, CAPILLARY     Status: Abnormal   Collection Time    11/06/13 11:21 PM      Result Value Ref Range   Glucose-Capillary 106 (*) 70 - 99 mg/dL  BASIC METABOLIC PANEL     Status: Abnormal   Collection Time    11/07/13  4:22 AM      Result Value Ref Range   Sodium 140  137 - 147 mEq/L   Potassium 3.7  3.7 - 5.3 mEq/L   Chloride 103  96 - 112 mEq/L   CO2 26  19 - 32 mEq/L   Glucose, Bld 124 (*) 70 - 99 mg/dL   BUN 6  6 - 23 mg/dL   Creatinine, Ser 0.88  0.50 - 1.10 mg/dL   Calcium 8.6  8.4 - 10.5 mg/dL   GFR calc non Af Amer 75 (*) >90 mL/min   GFR calc Af Amer 87 (*) >90 mL/min   Comment: (NOTE)     The eGFR has been calculated using the CKD  EPI equation.     This calculation has not been validated in all clinical situations.     eGFR's persistently <90 mL/min signify possible Chronic Kidney     Disease.   Anion gap 11  5 - 15  CBC     Status: Abnormal   Collection Time    11/07/13  4:22 AM      Result Value Ref Range   WBC 10.6 (*) 4.0 - 10.5 K/uL   RBC 4.04  3.87 - 5.11 MIL/uL   Hemoglobin 10.0 (*) 12.0 - 15.0 g/dL   HCT 32.5 (*) 36.0 - 46.0 %   MCV 80.4  78.0 - 100.0 fL   MCH 24.8 (*) 26.0 - 34.0 pg   MCHC 30.8  30.0 - 36.0 g/dL   RDW 14.2  11.5 - 15.5 %   Platelets 253  150 - 400 K/uL  GLUCOSE, CAPILLARY     Status: Abnormal   Collection Time    11/07/13  6:17 AM      Result Value Ref Range  Glucose-Capillary 124 (*) 70 - 99 mg/dL  GLUCOSE, CAPILLARY     Status: Abnormal   Collection Time    11/07/13 11:54 AM      Result Value Ref Range   Glucose-Capillary 110 (*) 70 - 99 mg/dL  GLUCOSE, CAPILLARY     Status: Abnormal   Collection Time    11/07/13  5:27 PM      Result Value Ref Range   Glucose-Capillary 104 (*) 70 - 99 mg/dL  GLUCOSE, CAPILLARY     Status: Abnormal   Collection Time    11/07/13 11:45 PM      Result Value Ref Range   Glucose-Capillary 131 (*) 70 - 99 mg/dL  BASIC METABOLIC PANEL     Status: Abnormal   Collection Time    11/08/13  4:30 AM      Result Value Ref Range   Sodium 140  137 - 147 mEq/L   Potassium 3.6 (*) 3.7 - 5.3 mEq/L   Chloride 103  96 - 112 mEq/L   CO2 27  19 - 32 mEq/L   Glucose, Bld 134 (*) 70 - 99 mg/dL   BUN 6  6 - 23 mg/dL   Creatinine, Ser 0.85  0.50 - 1.10 mg/dL   Calcium 8.6  8.4 - 10.5 mg/dL   GFR calc non Af Amer 78 (*) >90 mL/min   GFR calc Af Amer >90  >90 mL/min   Comment: (NOTE)     The eGFR has been calculated using the CKD EPI equation.     This calculation has not been validated in all clinical situations.     eGFR's persistently <90 mL/min signify possible Chronic Kidney     Disease.   Anion gap 10  5 - 15  CBC     Status: Abnormal    Collection Time    11/08/13  4:30 AM      Result Value Ref Range   WBC 10.0  4.0 - 10.5 K/uL   RBC 3.84 (*) 3.87 - 5.11 MIL/uL   Hemoglobin 9.7 (*) 12.0 - 15.0 g/dL   HCT 30.5 (*) 36.0 - 46.0 %   MCV 79.4  78.0 - 100.0 fL   MCH 25.3 (*) 26.0 - 34.0 pg   MCHC 31.8  30.0 - 36.0 g/dL   RDW 14.2  11.5 - 15.5 %   Platelets 289  150 - 400 K/uL  GLUCOSE, CAPILLARY     Status: Abnormal   Collection Time    11/08/13  6:42 AM      Result Value Ref Range   Glucose-Capillary 100 (*) 70 - 99 mg/dL    Radiology/Results: No results found.  Anti-infectives: Anti-infectives   Start     Dose/Rate Route Frequency Ordered Stop   11/07/13 1600  metroNIDAZOLE (FLAGYL) tablet 500 mg     500 mg Oral 3 times daily 11/07/13 1020     11/07/13 1100  ciprofloxacin (CIPRO) tablet 500 mg     500 mg Oral 2 times daily 11/07/13 1018     11/07/13 1100  metroNIDAZOLE (FLAGYL) tablet 500 mg  Status:  Discontinued     500 mg Oral 3 times daily 11/07/13 1018 11/07/13 1020   11/04/13 1600  ciprofloxacin (CIPRO) IVPB 400 mg  Status:  Discontinued     400 mg 200 mL/hr over 60 Minutes Intravenous Every 12 hours 11/04/13 1333 11/07/13 1018   11/04/13 1500  metroNIDAZOLE (FLAGYL) IVPB 500 mg  Status:  Discontinued     500  mg 100 mL/hr over 60 Minutes Intravenous Every 6 hours 11/04/13 1333 11/07/13 1018      Assessment/Plan: Problem List: Patient Active Problem List   Diagnosis Date Noted  . SBO (small bowel obstruction) 11/04/2013  . Partial small bowel obstruction 11/04/2013  . Enteritis 11/04/2013  . Diabetes mellitus 11/04/2013  . Anxiety state, unspecified 11/04/2013  . Chronic abdominal pain 11/04/2013  . Essential hypertension, benign 11/04/2013  . Bloating 06/05/2013  . Abdominal pain, unspecified site 06/04/2013    Resolution of partial small bowel obstruction.  It sounds like she may have had a food bezoar (popcorn);  Advised to keep food diary for other high fiber foods that may cause problems.    OK to discharge.   * No surgery found *    LOS: 4 days   Matt B. Hassell Done, MD, [facility] [phone] beeper [phone]  11/08/2013 9:36 AM

## 2013-11-11 NOTE — ED Provider Notes (Signed)
Medical screening examination/treatment/procedure(s) were conducted as a shared visit with non-physician practitioner(s) and myself.  I personally evaluated the patient during the encounter.   EKG Interpretation None      Pt comes in with cc of abd pain. Hx of SBO and i believe IBD. Pt's imaging here shows SBO. Will admit to medicine for further care. No periotoneal signs on exam and VSS.  Derwood KaplanAnkit Smayan Hackbart, MD 11/11/13 [phone]

## 2013-11-12 ENCOUNTER — Telehealth (INDEPENDENT_AMBULATORY_CARE_PROVIDER_SITE_OTHER): Payer: Self-pay

## 2013-11-12 NOTE — Telephone Encounter (Signed)
Pt called in asking for f/u appt. Her discharge says to f/u with us as needed but she states she was told to see Dr Donell BeersByerly in 2 weeks. Does she need appt here? Advised we will call her back regarding an appt.

## 2013-11-12 NOTE — Telephone Encounter (Signed)
Per Dr. Donell BeersByerly pt should f/u with [facility] prn, but she will need to see her PCP in one month.  Pt understands and will make the appointment.

## 2014-06-05 ENCOUNTER — Other Ambulatory Visit: Payer: Self-pay | Admitting: Obstetrics and Gynecology

## 2014-06-08 LAB — CYTOLOGY - PAP

## 2014-10-05 ENCOUNTER — Other Ambulatory Visit: Payer: Self-pay

## 2015-07-30 DIAGNOSIS — E119 Type 2 diabetes mellitus without complications: Secondary | ICD-10-CM | POA: Diagnosis not present

## 2015-07-30 DIAGNOSIS — F419 Anxiety disorder, unspecified: Secondary | ICD-10-CM | POA: Diagnosis not present

## 2015-07-30 DIAGNOSIS — E663 Overweight: Secondary | ICD-10-CM | POA: Diagnosis not present

## 2015-07-30 DIAGNOSIS — G4700 Insomnia, unspecified: Secondary | ICD-10-CM | POA: Diagnosis not present

## 2015-07-30 DIAGNOSIS — Z0000 Encounter for general adult medical examination without abnormal findings: Secondary | ICD-10-CM | POA: Diagnosis not present

## 2016-02-04 DIAGNOSIS — E7800 Pure hypercholesterolemia, unspecified: Secondary | ICD-10-CM | POA: Diagnosis not present

## 2016-02-04 DIAGNOSIS — E119 Type 2 diabetes mellitus without complications: Secondary | ICD-10-CM | POA: Diagnosis not present

## 2016-02-04 DIAGNOSIS — G4700 Insomnia, unspecified: Secondary | ICD-10-CM | POA: Diagnosis not present

## 2016-02-04 DIAGNOSIS — F419 Anxiety disorder, unspecified: Secondary | ICD-10-CM | POA: Diagnosis not present

## 2016-02-04 DIAGNOSIS — I10 Essential (primary) hypertension: Secondary | ICD-10-CM | POA: Diagnosis not present

## 2016-02-04 DIAGNOSIS — E663 Overweight: Secondary | ICD-10-CM | POA: Diagnosis not present

## 2016-04-05 DIAGNOSIS — E119 Type 2 diabetes mellitus without complications: Secondary | ICD-10-CM | POA: Diagnosis not present

## 2016-04-17 ENCOUNTER — Emergency Department (HOSPITAL_BASED_OUTPATIENT_CLINIC_OR_DEPARTMENT_OTHER): Payer: Federal, State, Local not specified - PPO

## 2016-04-17 ENCOUNTER — Emergency Department (HOSPITAL_BASED_OUTPATIENT_CLINIC_OR_DEPARTMENT_OTHER)
Admission: EM | Admit: 2016-04-17 | Discharge: 2016-04-17 | Disposition: A | Discharge status: Home / Self Care | Payer: Federal, State, Local not specified - PPO | Attending: Emergency Medicine | Admitting: Emergency Medicine

## 2016-04-17 ENCOUNTER — Encounter (HOSPITAL_BASED_OUTPATIENT_CLINIC_OR_DEPARTMENT_OTHER): Payer: Self-pay

## 2016-04-17 DIAGNOSIS — R109 Unspecified abdominal pain: Secondary | ICD-10-CM | POA: Diagnosis not present

## 2016-04-17 DIAGNOSIS — Z7984 Long term (current) use of oral hypoglycemic drugs: Secondary | ICD-10-CM | POA: Diagnosis not present

## 2016-04-17 DIAGNOSIS — R1084 Generalized abdominal pain: Secondary | ICD-10-CM

## 2016-04-17 DIAGNOSIS — I10 Essential (primary) hypertension: Secondary | ICD-10-CM | POA: Insufficient documentation

## 2016-04-17 DIAGNOSIS — E119 Type 2 diabetes mellitus without complications: Secondary | ICD-10-CM | POA: Insufficient documentation

## 2016-04-17 DIAGNOSIS — R112 Nausea with vomiting, unspecified: Secondary | ICD-10-CM | POA: Insufficient documentation

## 2016-04-17 HISTORY — DX: Type 2 diabetes mellitus without complications: E11.9

## 2016-04-17 LAB — COMPREHENSIVE METABOLIC PANEL
ALT: 26 U/L (ref 14–54)
AST: 19 U/L (ref 15–41)
Albumin: 3.8 g/dL (ref 3.5–5.0)
Alkaline Phosphatase: 72 U/L (ref 38–126)
Anion gap: 8 (ref 5–15)
BUN: 19 mg/dL (ref 6–20)
CHLORIDE: 101 mmol/L (ref 101–111)
CO2: 30 mmol/L (ref 22–32)
Calcium: 9.3 mg/dL (ref 8.9–10.3)
Creatinine, Ser: 1.1 mg/dL — ABNORMAL HIGH (ref 0.44–1.00)
GFR calc Af Amer: >60 mL/min (ref >60)
GFR calc non Af Amer: 56 mL/min — ABNORMAL LOW (ref >60)
GLUCOSE: 95 mg/dL (ref 65–99)
Potassium: 3.5 mmol/L (ref 3.5–5.1)
Sodium: 139 mmol/L (ref 135–145)
Total Bilirubin: 0.5 mg/dL (ref 0.3–1.2)
Total Protein: 7.3 g/dL (ref 6.5–8.1)

## 2016-04-17 LAB — CBC WITH DIFFERENTIAL/PLATELET
Basophils Absolute: 0 10*3/uL (ref 0.0–0.1)
Basophils Relative: 0 %
EOS PCT: 2 %
Eosinophils Absolute: 0.2 10*3/uL (ref 0.0–0.7)
HCT: 37.3 % (ref 36.0–46.0)
Hemoglobin: 11.4 g/dL — ABNORMAL LOW (ref 12.0–15.0)
LYMPHS ABS: 0.8 10*3/uL (ref 0.7–4.0)
Lymphocytes Relative: 7 %
MCH: 24.8 pg — AB (ref 26.0–34.0)
MCHC: 30.6 g/dL (ref 30.0–36.0)
MCV: 81.1 fL (ref 78.0–100.0)
MONO ABS: 0.4 10*3/uL (ref 0.1–1.0)
Monocytes Relative: 4 %
Neutro Abs: 9.8 10*3/uL — ABNORMAL HIGH (ref 1.7–7.7)
Neutrophils Relative %: 87 %
PLATELETS: 317 10*3/uL (ref 150–400)
RBC: 4.6 MIL/uL (ref 3.87–5.11)
RDW: 14.5 % (ref 11.5–15.5)
WBC: 11.2 10*3/uL — ABNORMAL HIGH (ref 4.0–10.5)

## 2016-04-17 LAB — URINALYSIS, ROUTINE W REFLEX MICROSCOPIC
BILIRUBIN URINE: NEGATIVE
Glucose, UA: NEGATIVE mg/dL
Ketones, ur: NEGATIVE mg/dL
Leukocytes, UA: NEGATIVE
Nitrite: NEGATIVE
Protein, ur: NEGATIVE mg/dL
Specific Gravity, Urine: 1.025 (ref 1.005–1.030)
pH: 5.5 (ref 5.0–8.0)

## 2016-04-17 LAB — URINALYSIS, MICROSCOPIC (REFLEX)

## 2016-04-17 LAB — LIPASE, BLOOD: LIPASE: 17 U/L (ref 11–51)

## 2016-04-17 MED ORDER — METOCLOPRAMIDE HCL 5 MG/ML IJ SOLN
10.0000 mg | Freq: Once | INTRAMUSCULAR | Status: AC
  Administered 2016-04-17: 10 mg via INTRAVENOUS
  Filled 2016-04-17: qty 2

## 2016-04-17 MED ORDER — METOCLOPRAMIDE HCL 10 MG PO TABS: 10.0000 mg | ORAL_TABLET | Freq: Four times a day (QID) | ORAL | 0 refills | Status: DC | PRN

## 2016-04-17 MED ORDER — MORPHINE SULFATE (PF) 4 MG/ML IV SOLN
4.0000 mg | Freq: Once | INTRAVENOUS | Status: AC
  Administered 2016-04-17: 4 mg via INTRAVENOUS
  Filled 2016-04-17: qty 1

## 2016-04-17 MED ORDER — IOPAMIDOL (ISOVUE-300) INJECTION 61%
100.0000 mL | Freq: Once | INTRAVENOUS | Status: AC | PRN
  Administered 2016-04-17: 100 mL via INTRAVENOUS

## 2016-04-17 MED ORDER — SODIUM CHLORIDE 0.9 % IV BOLUS (SEPSIS)
1000.0000 mL | Freq: Once | INTRAVENOUS | Status: AC
  Administered 2016-04-17: 1000 mL via INTRAVENOUS

## 2016-04-17 MED FILL — METOCLOPRAMIDE 10 MG TABLET: 10 | 2 days supply | Qty: 8 | Fill #0

## 2016-04-17 NOTE — ED Provider Notes (Signed)
[facility] Provider Note   CSN: [id number] Arrival date & time: 04/17/16  1352  By signing my name below, I, Rosario Adie, attest that this documentation has been prepared under the direction and in the presence of Doug Sou, MD. Electronically Signed: Rosario Adie, ED Scribe. 04/17/16. 3:22 PM.  History   Chief Complaint Chief Complaint  Patient presents with  . Abdominal Pain   The history is provided by the patient. No language interpreter was used.    HPI Comments: [name] is a 56 y.o. female with a PMHx of DM (dx'd 1 year ago), HTN, and prior ectopic pregnancy, who presents to the Emergency Department complaining of intermittent, gradually worsening, generalized abdominal pain beginning last night. She describes her pain as cramping and diffuse. Her pain will last 1-2 minutes each time, and the time between episodes will be 3-4 minutes. Pt notes associated chills, generalized myalgias/arthralgias, nausea and one episode of vomiting secondary to her abdominal pain. She notes that the contents of her vomitus were the drink of ginger ale she had just ingested prior to this. Pt states that she did have a bowel movement today, but there was moderate pain with passing her stool. She also reports that her pain today is similar to the pain she experienced with her last bowel obstruction. Sitting upright will mildly alleviate her pain and laying flat will mildly exacerbate her pain. She has been taking Levsin and drinking ginger ale at home without relief of her symptoms. Pt is a non-smoker and does not drink alcohol. No illicit drug usage otherwise.  She has a prior surgical history to the abdomen including salpingectomy and total hysterectomy. She denies fever, dysuria, back pain, or any other associated symptoms. . No other associated symptoms  Past Medical History:  Diagnosis Date  . Borderline diabetic   . Bowel obstruction   . Depression   . Diabetes  mellitus without complication (HCC)   . Ectopic pregnancy   . Hypercholesteremia   . Hypertension   . Sleep disorder    Patient Active Problem List   Diagnosis Date Noted  . SBO (small bowel obstruction) 11/04/2013  . Partial small bowel obstruction 11/04/2013  . Enteritis 11/04/2013  . Diabetes mellitus (HCC) 11/04/2013  . Anxiety state, unspecified 11/04/2013  . Chronic abdominal pain 11/04/2013  . Essential hypertension, benign 11/04/2013  . Bloating 06/05/2013  . Abdominal pain, unspecified site 06/04/2013   Past Surgical History:  Procedure Laterality Date  . ABDOMINAL HYSTERECTOMY  2003  . CESAREAN SECTION  2003  . COLONOSCOPY    . ECTOPIC PREGNANCY SURGERY     tubal removel  . ESOPHAGOGASTRODUODENOSCOPY    . EXPLORATORY LAPAROTOMY    . TONSILLECTOMY    . UTERINE FIBROID SURGERY     OB History    No data available     Home Medications    Prior to Admission medications   Medication Sig Start Date End Date Taking? Authorizing Provider  clonazePAM (KLONOPIN) 0.5 MG tablet Take 0.5 mg by mouth at bedtime as needed for anxiety.     Historical Provider, MD  hyoscyamine (LEVSIN SL) 0.125 MG SL tablet Place 1 tablet (0.125 mg total) under the tongue every 4 (four) hours as needed for cramping. 10/22/12   Mardella Layman, MD  losartan (COZAAR) 50 MG tablet Take 50 mg by mouth daily.    Historical Provider, MD  metFORMIN (GLUCOPHAGE) 500 MG tablet Take 500 mg by mouth 2 (two) times daily  with a meal.  05/11/13   Historical Provider, MD  sertraline (ZOLOFT) 50 MG tablet Take 50 mg by mouth daily.  05/08/13   Historical Provider, MD  simvastatin (ZOCOR) 40 MG tablet Take 40 mg by mouth every evening.    Historical Provider, MD  zolpidem (AMBIEN) 5 MG tablet Take 5 mg by mouth at bedtime as needed for sleep.  04/11/13   Historical Provider, MD   Family History Family History  Problem Relation Age of Onset  . Diabetes Mother   . Colon cancer Neg Hx    Social History Social  History  Substance Use Topics  . Smoking status: Never Smoker  . Smokeless tobacco: Never Used  . Alcohol use No   Allergies   Amoxicillin; Strawberry extract; Ibuprofen; Tetracyclines & related; and Wellbutrin [bupropion]  Review of Systems Review of Systems  Constitutional: Positive for chills. Negative for fever.  HENT: Negative.   Respiratory: Negative.   Cardiovascular: Negative.   Gastrointestinal: Positive for abdominal pain, nausea and vomiting.  Genitourinary: Negative for dysuria.  Musculoskeletal: Positive for arthralgias (generalized) and myalgias (generalized). Negative for back pain.  Skin: Negative.   Neurological: Negative.   Psychiatric/Behavioral: Negative.   All other systems reviewed and are negative.  Physical Exam Updated Vital Signs BP 134/86 (BP Location: Left Arm)   Pulse 90   Temp 98.8 F (37.1 C) (Oral)   Resp 20   Ht 5' 2.5" (1.588 m)   Wt 174 lb (78.9 kg)   SpO2 97%   BMI 31.32 kg/m   Physical Exam  Constitutional: She appears well-developed and well-nourished. No distress.  HENT:  Head: Normocephalic and atraumatic.  Eyes: Conjunctivae are normal. Pupils are equal, round, and reactive to light.  Neck: Neck supple. No tracheal deviation present. No thyromegaly present.  Cardiovascular: Normal rate and regular rhythm.   No murmur heard. Pulmonary/Chest: Effort normal and breath sounds normal.  Abdominal: Soft. Bowel sounds are normal. She exhibits no distension and no mass. There is tenderness. There is rebound. There is no guarding.  Obese. Mild diffuse tenderness  Musculoskeletal: Normal range of motion. She exhibits no edema or tenderness.  Neurological: She is alert. Coordination normal.  Skin: Skin is warm and dry. No rash noted.  Psychiatric: She has a normal mood and affect.  Nursing note and vitals reviewed.  ED Treatments / Results  DIAGNOSTIC STUDIES: Oxygen Saturation is 97% on RA, normal by my interpretation.    COORDINATION OF CARE: 3:22 PM-Discussed next steps with pt. Pt verbalized understanding and is agreeable with the plan.   Labs (all labs ordered are listed, but only abnormal results are displayed) Labs Reviewed  URINALYSIS, ROUTINE W REFLEX MICROSCOPIC - Abnormal; Notable for the following:       Result Value   Hgb urine dipstick MODERATE (*)    All other components within normal limits  URINALYSIS, MICROSCOPIC (REFLEX) - Abnormal; Notable for the following:    Bacteria, UA MANY (*)    Squamous Epithelial / LPF 0-5 (*)    All other components within normal limits   EKG  EKG Interpretation None      Results for orders placed or performed during the hospital encounter of 04/17/16  Urinalysis, Routine w reflex microscopic  Result Value Ref Range   Color, Urine YELLOW YELLOW   APPearance CLEAR CLEAR   Specific Gravity, Urine 1.025 1.005 - 1.030   pH 5.5 5.0 - 8.0   Glucose, UA NEGATIVE NEGATIVE mg/dL   Hgb urine dipstick  MODERATE (A) NEGATIVE   Bilirubin Urine NEGATIVE NEGATIVE   Ketones, ur NEGATIVE NEGATIVE mg/dL   Protein, ur NEGATIVE NEGATIVE mg/dL   Nitrite NEGATIVE NEGATIVE   Leukocytes, UA NEGATIVE NEGATIVE  Urinalysis, Microscopic (reflex)  Result Value Ref Range   RBC / HPF 6-30 0 - 5 RBC/hpf   WBC, UA 0-5 0 - 5 WBC/hpf   Bacteria, UA MANY (A) NONE SEEN   Squamous Epithelial / LPF 0-5 (A) NONE SEEN   Mucous PRESENT   Comprehensive metabolic panel  Result Value Ref Range   Sodium 139 135 - 145 mmol/L   Potassium 3.5 3.5 - 5.1 mmol/L   Chloride 101 101 - 111 mmol/L   CO2 30 22 - 32 mmol/L   Glucose, Bld 95 65 - 99 mg/dL   BUN 19 6 - 20 mg/dL   Creatinine, Ser 4.09 (H) 0.44 - 1.00 mg/dL   Calcium 9.3 8.9 - 81.1 mg/dL   Total Protein 7.3 6.5 - 8.1 g/dL   Albumin 3.8 3.5 - 5.0 g/dL   AST 19 15 - 41 U/L   ALT 26 14 - 54 U/L   Alkaline Phosphatase 72 38 - 126 U/L   Total Bilirubin 0.5 0.3 - 1.2 mg/dL   GFR calc non Af Amer 56 (L) >60 mL/min   GFR calc Af  Amer >60 >60 mL/min   Anion gap 8 5 - 15  CBC with Differential/Platelet  Result Value Ref Range   WBC 11.2 (H) 4.0 - 10.5 K/uL   RBC 4.60 3.87 - 5.11 MIL/uL   Hemoglobin 11.4 (L) 12.0 - 15.0 g/dL   HCT 91.4 78.2 - 95.6 %   MCV 81.1 78.0 - 100.0 fL   MCH 24.8 (L) 26.0 - 34.0 pg   MCHC 30.6 30.0 - 36.0 g/dL   RDW 21.3 08.6 - 57.8 %   Platelets 317 150 - 400 K/uL   Neutrophils Relative % 87 %   Neutro Abs 9.8 (H) 1.7 - 7.7 K/uL   Lymphocytes Relative 7 %   Lymphs Abs 0.8 0.7 - 4.0 K/uL   Monocytes Relative 4 %   Monocytes Absolute 0.4 0.1 - 1.0 K/uL   Eosinophils Relative 2 %   Eosinophils Absolute 0.2 0.0 - 0.7 K/uL   Basophils Relative 0 %   Basophils Absolute 0.0 0.0 - 0.1 K/uL  Lipase, blood  Result Value Ref Range   Lipase 17 11 - 51 U/L   Ct Abdomen Pelvis W Contrast  Result Date: 04/17/2016 CLINICAL DATA:  C/o abd pain started last night-nausea, vomited x 1, h/o bowel obstruction, hysterectomy, EXAM: CT ABDOMEN AND PELVIS WITH CONTRAST TECHNIQUE: Multidetector CT imaging of the abdomen and pelvis was performed using the standard protocol following bolus administration of intravenous contrast. CONTRAST:  ISOVUE-300 IOPAMIDOL (ISOVUE-300) INJECTION 61% COMPARISON:  11/04/2013 FINDINGS: Lower chest: No acute abnormality. Hepatobiliary: No focal liver abnormality is seen. No gallstones, gallbladder wall thickening, or biliary dilatation. Pancreas: Unremarkable. No pancreatic ductal dilatation or surrounding inflammatory changes. Spleen: Normal in size without focal abnormality. Adrenals/Urinary Tract: Adrenal glands are unremarkable. Kidneys are normal, without renal calculi, focal lesion, or hydronephrosis. Bladder is unremarkable. 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 Stomach/Bowel: Stomach is physiologically distended. Incomplete distal passage of oral contrast material. A few mildly distended proximal small bowel loops, decompressed distally, without discrete  transition point or pathology. Terminal ileum unremarkable. Appendix not identified. Colon nondilated. Vascular/Lymphatic: Mild scattered aortoiliac calcified plaque. No aneurysm. Portal vein patent. No adenopathy localized. Bilateral  pelvic phleboliths. Reproductive: Status post hysterectomy. No adnexal masses. Other: No ascites.  No free air. Musculoskeletal: No acute or significant osseous findings. IMPRESSION: 1. No acute abdominal process. Electronically Signed   By: Corlis Leak  Hassell M.D.   On: 04/17/2016 16:58   Radiology No results found.  Procedures Procedures   Medications Ordered in ED Medications - No data to display  Initial Impression / Assessment and Plan / ED Course  I have reviewed the triage vital signs and the nursing notes.  Pertinent labs & imaging results that were available during my care of the patient were reviewed by me and considered in my medical decision making (see chart for details).  Clinical Course    4 PM feels improved after treatment with intravenous fluids, antiemetics and opioids pain medication 5:10 PM patient feels well and feels ready for discharge. She is able to drink without difficulty. Plan prescription Reglan. Encourage oral hydration. Follow up with Dr. polite if not feeling better in 2 or 3 days. Final Clinical Impressions(s) / ED Diagnoses  Diagnosis #1 is abdominal pain #2 nausea and vomiting Final diagnoses:  None   New Prescriptions New Prescriptions   No medications on file   I personally performed the services described in this documentation, which was scribed in my presence. The recorded information has been reviewed and considered.    Doug SouSam Jaasia Viglione, MD 04/17/16 [phone]

## 2016-04-17 NOTE — Discharge Instructions (Signed)
Take the medication as needed for nausea. Take Tylenol as directed for abdominal discomfort. Return if you're unable to hold down fluids without vomiting after taking the medication prescribed or if concern for any reason. Arrange to see Dr.Polite in the office if not feeling improved within the next 2 or 3 days.

## 2016-04-17 NOTE — ED Triage Notes (Signed)
C/o abd pain started last night-nausea, vomited x 1-last BM today-states pain feels same as hx of bowel obs-steady gait-grimacing

## 2016-07-28 DIAGNOSIS — Z6830 Body mass index (BMI) 30.0-30.9, adult: Secondary | ICD-10-CM | POA: Diagnosis not present

## 2016-07-28 DIAGNOSIS — Z01419 Encounter for gynecological examination (general) (routine) without abnormal findings: Secondary | ICD-10-CM | POA: Diagnosis not present

## 2016-07-28 DIAGNOSIS — Z1231 Encounter for screening mammogram for malignant neoplasm of breast: Secondary | ICD-10-CM | POA: Diagnosis not present

## 2016-08-22 DIAGNOSIS — Z0000 Encounter for general adult medical examination without abnormal findings: Secondary | ICD-10-CM | POA: Diagnosis not present

## 2016-08-22 DIAGNOSIS — E663 Overweight: Secondary | ICD-10-CM | POA: Diagnosis not present

## 2016-08-22 DIAGNOSIS — G4700 Insomnia, unspecified: Secondary | ICD-10-CM | POA: Diagnosis not present

## 2016-08-22 DIAGNOSIS — E7800 Pure hypercholesterolemia, unspecified: Secondary | ICD-10-CM | POA: Diagnosis not present

## 2016-08-22 DIAGNOSIS — Z7984 Long term (current) use of oral hypoglycemic drugs: Secondary | ICD-10-CM | POA: Diagnosis not present

## 2016-08-22 DIAGNOSIS — E119 Type 2 diabetes mellitus without complications: Secondary | ICD-10-CM | POA: Diagnosis not present

## 2016-09-21 DIAGNOSIS — N390 Urinary tract infection, site not specified: Secondary | ICD-10-CM | POA: Diagnosis not present

## 2016-12-04 ENCOUNTER — Telehealth: Payer: Self-pay | Admitting: Hematology

## 2016-12-04 ENCOUNTER — Encounter: Payer: Self-pay | Admitting: Hematology

## 2016-12-04 NOTE — Telephone Encounter (Signed)
Appt has been scheduled for the pt to see Dr. Mosetta Putt on 9/7 at 11am Will mail the pt a letter and fax to the referring to notify the pt.

## 2016-12-07 ENCOUNTER — Encounter: Payer: Self-pay | Admitting: Hematology

## 2016-12-15 ENCOUNTER — Encounter: Payer: Federal, State, Local not specified - PPO | Admitting: Hematology

## 2017-02-23 DIAGNOSIS — E119 Type 2 diabetes mellitus without complications: Secondary | ICD-10-CM | POA: Diagnosis not present

## 2017-02-23 DIAGNOSIS — E7800 Pure hypercholesterolemia, unspecified: Secondary | ICD-10-CM | POA: Diagnosis not present

## 2017-02-23 DIAGNOSIS — I10 Essential (primary) hypertension: Secondary | ICD-10-CM | POA: Diagnosis not present

## 2017-02-23 DIAGNOSIS — G4700 Insomnia, unspecified: Secondary | ICD-10-CM | POA: Diagnosis not present

## 2017-05-11 DIAGNOSIS — R413 Other amnesia: Secondary | ICD-10-CM | POA: Diagnosis not present

## 2017-05-11 DIAGNOSIS — M7711 Lateral epicondylitis, right elbow: Secondary | ICD-10-CM | POA: Diagnosis not present

## 2017-05-11 DIAGNOSIS — M25521 Pain in right elbow: Secondary | ICD-10-CM | POA: Diagnosis not present

## 2017-06-20 ENCOUNTER — Other Ambulatory Visit: Payer: Self-pay

## 2017-06-20 ENCOUNTER — Encounter: Payer: Self-pay | Admitting: Emergency Medicine

## 2017-06-20 DIAGNOSIS — I10 Essential (primary) hypertension: Secondary | ICD-10-CM | POA: Insufficient documentation

## 2017-06-20 DIAGNOSIS — K3184 Gastroparesis: Secondary | ICD-10-CM | POA: Diagnosis not present

## 2017-06-20 DIAGNOSIS — E119 Type 2 diabetes mellitus without complications: Secondary | ICD-10-CM | POA: Insufficient documentation

## 2017-06-20 DIAGNOSIS — K5900 Constipation, unspecified: Secondary | ICD-10-CM | POA: Insufficient documentation

## 2017-06-20 DIAGNOSIS — F329 Major depressive disorder, single episode, unspecified: Secondary | ICD-10-CM | POA: Diagnosis not present

## 2017-06-20 DIAGNOSIS — R110 Nausea: Secondary | ICD-10-CM | POA: Diagnosis not present

## 2017-06-20 DIAGNOSIS — E860 Dehydration: Secondary | ICD-10-CM | POA: Diagnosis not present

## 2017-06-20 DIAGNOSIS — N390 Urinary tract infection, site not specified: Secondary | ICD-10-CM

## 2017-06-20 DIAGNOSIS — R1084 Generalized abdominal pain: Secondary | ICD-10-CM | POA: Diagnosis not present

## 2017-06-20 DIAGNOSIS — G4733 Obstructive sleep apnea (adult) (pediatric): Secondary | ICD-10-CM | POA: Diagnosis not present

## 2017-06-20 DIAGNOSIS — Z7984 Long term (current) use of oral hypoglycemic drugs: Secondary | ICD-10-CM | POA: Insufficient documentation

## 2017-06-20 DIAGNOSIS — E1143 Type 2 diabetes mellitus with diabetic autonomic (poly)neuropathy: Secondary | ICD-10-CM | POA: Diagnosis not present

## 2017-06-20 DIAGNOSIS — Z881 Allergy status to other antibiotic agents status: Secondary | ICD-10-CM | POA: Diagnosis not present

## 2017-06-20 DIAGNOSIS — E7800 Pure hypercholesterolemia, unspecified: Secondary | ICD-10-CM | POA: Diagnosis not present

## 2017-06-20 DIAGNOSIS — Z9102 Food additives allergy status: Secondary | ICD-10-CM | POA: Diagnosis not present

## 2017-06-20 DIAGNOSIS — Z79899 Other long term (current) drug therapy: Secondary | ICD-10-CM | POA: Insufficient documentation

## 2017-06-20 DIAGNOSIS — K5652 Intestinal adhesions [bands] with complete obstruction: Secondary | ICD-10-CM | POA: Diagnosis not present

## 2017-06-20 DIAGNOSIS — Z886 Allergy status to analgesic agent status: Secondary | ICD-10-CM | POA: Diagnosis not present

## 2017-06-20 MED ORDER — ONDANSETRON 4 MG PO TBDP
4.0000 mg | ORAL_TABLET | Freq: Once | ORAL | Status: AC | PRN
  Administered 2017-06-20: 4 mg via ORAL
  Filled 2017-06-20: qty 1

## 2017-06-20 MED ORDER — OXYCODONE-ACETAMINOPHEN 5-325 MG PO TABS
1.0000 | ORAL_TABLET | ORAL | Status: DC | PRN
  Administered 2017-06-20: 1 via ORAL
  Filled 2017-06-20: qty 1

## 2017-06-20 NOTE — ED Triage Notes (Addendum)
Patient to the ER for c/o abd pain with left upper and lower abd pain. States she had a biscuit on her way to work today and then developed abd pain and nausea. Denies any fevers or diarrhea. No vomiting as of yet. States she has h/o bowel obstruction that this feels similar to.

## 2017-06-21 ENCOUNTER — Emergency Department
Admission: EM | Admit: 2017-06-21 | Discharge: 2017-06-21 | Disposition: A | Discharge status: Home / Self Care | Payer: Federal, State, Local not specified - PPO | Source: Home / Self Care | Attending: Emergency Medicine | Admitting: Emergency Medicine

## 2017-06-21 ENCOUNTER — Emergency Department: Payer: Federal, State, Local not specified - PPO

## 2017-06-21 ENCOUNTER — Encounter: Payer: Self-pay | Admitting: Emergency Medicine

## 2017-06-21 ENCOUNTER — Other Ambulatory Visit: Payer: Self-pay

## 2017-06-21 ENCOUNTER — Emergency Department (HOSPITAL_COMMUNITY)
Admission: EM | Admit: 2017-06-21 | Discharge: 2017-06-21 | Disposition: A | Discharge status: Home / Self Care | Payer: Federal, State, Local not specified - PPO | Attending: Emergency Medicine | Admitting: Emergency Medicine

## 2017-06-21 ENCOUNTER — Encounter (HOSPITAL_COMMUNITY): Payer: Self-pay | Admitting: Emergency Medicine

## 2017-06-21 ENCOUNTER — Inpatient Hospital Stay
Admission: EM | Admit: 2017-06-21 | Discharge: 2017-06-25 | DRG: 390 | Disposition: A | Discharge status: Home / Self Care | Payer: Federal, State, Local not specified - PPO | Attending: Surgery | Admitting: Surgery

## 2017-06-21 DIAGNOSIS — K3184 Gastroparesis: Secondary | ICD-10-CM | POA: Diagnosis present

## 2017-06-21 DIAGNOSIS — Z5321 Procedure and treatment not carried out due to patient leaving prior to being seen by health care provider: Secondary | ICD-10-CM | POA: Insufficient documentation

## 2017-06-21 DIAGNOSIS — Z881 Allergy status to other antibiotic agents status: Secondary | ICD-10-CM

## 2017-06-21 DIAGNOSIS — R1084 Generalized abdominal pain: Secondary | ICD-10-CM | POA: Diagnosis not present

## 2017-06-21 DIAGNOSIS — R109 Unspecified abdominal pain: Secondary | ICD-10-CM | POA: Diagnosis not present

## 2017-06-21 DIAGNOSIS — Z4659 Encounter for fitting and adjustment of other gastrointestinal appliance and device: Secondary | ICD-10-CM

## 2017-06-21 DIAGNOSIS — E1143 Type 2 diabetes mellitus with diabetic autonomic (poly)neuropathy: Secondary | ICD-10-CM | POA: Diagnosis present

## 2017-06-21 DIAGNOSIS — N390 Urinary tract infection, site not specified: Secondary | ICD-10-CM

## 2017-06-21 DIAGNOSIS — E7800 Pure hypercholesterolemia, unspecified: Secondary | ICD-10-CM | POA: Diagnosis present

## 2017-06-21 DIAGNOSIS — Z7984 Long term (current) use of oral hypoglycemic drugs: Secondary | ICD-10-CM

## 2017-06-21 DIAGNOSIS — K56609 Unspecified intestinal obstruction, unspecified as to partial versus complete obstruction: Secondary | ICD-10-CM | POA: Diagnosis present

## 2017-06-21 DIAGNOSIS — Z886 Allergy status to analgesic agent status: Secondary | ICD-10-CM

## 2017-06-21 DIAGNOSIS — E860 Dehydration: Secondary | ICD-10-CM | POA: Diagnosis present

## 2017-06-21 DIAGNOSIS — Z9102 Food additives allergy status: Secondary | ICD-10-CM

## 2017-06-21 DIAGNOSIS — I10 Essential (primary) hypertension: Secondary | ICD-10-CM | POA: Diagnosis not present

## 2017-06-21 DIAGNOSIS — K5900 Constipation, unspecified: Secondary | ICD-10-CM

## 2017-06-21 DIAGNOSIS — F329 Major depressive disorder, single episode, unspecified: Secondary | ICD-10-CM | POA: Diagnosis present

## 2017-06-21 DIAGNOSIS — G4733 Obstructive sleep apnea (adult) (pediatric): Secondary | ICD-10-CM | POA: Diagnosis present

## 2017-06-21 DIAGNOSIS — K56699 Other intestinal obstruction unspecified as to partial versus complete obstruction: Secondary | ICD-10-CM | POA: Diagnosis not present

## 2017-06-21 DIAGNOSIS — Z79899 Other long term (current) drug therapy: Secondary | ICD-10-CM

## 2017-06-21 DIAGNOSIS — K5652 Intestinal adhesions [bands] with complete obstruction: Principal | ICD-10-CM | POA: Diagnosis present

## 2017-06-21 LAB — URINALYSIS, COMPLETE (UACMP) WITH MICROSCOPIC
BACTERIA UA: NONE SEEN
BILIRUBIN URINE: NEGATIVE
Bacteria, UA: NONE SEEN
Bilirubin Urine: NEGATIVE
GLUCOSE, UA: NEGATIVE mg/dL
Glucose, UA: NEGATIVE mg/dL
Hgb urine dipstick: NEGATIVE
KETONES UR: NEGATIVE mg/dL
Ketones, ur: 20 mg/dL — AB
LEUKOCYTES UA: NEGATIVE
Nitrite: NEGATIVE
Nitrite: NEGATIVE
PH: 5 (ref 5.0–8.0)
PROTEIN: NEGATIVE mg/dL
Protein, ur: 30 mg/dL — AB
SPECIFIC GRAVITY, URINE: 1.028 (ref 1.005–1.030)
Specific Gravity, Urine: 1.02 (ref 1.005–1.030)
pH: 5 (ref 5.0–8.0)

## 2017-06-21 LAB — CBC
HCT: 36.5 % (ref 35.0–47.0)
HCT: 40.7 % (ref 36.0–46.0)
HEMOGLOBIN: 12.6 g/dL (ref 12.0–15.0)
Hemoglobin: 11.5 g/dL — ABNORMAL LOW (ref 12.0–16.0)
MCH: 24.8 pg — AB (ref 26.0–34.0)
MCH: 25.3 pg — ABNORMAL LOW (ref 26.0–34.0)
MCHC: 31 g/dL (ref 30.0–36.0)
MCHC: 31.6 g/dL — ABNORMAL LOW (ref 32.0–36.0)
MCV: 78.6 fL — ABNORMAL LOW (ref 80.0–100.0)
MCV: 81.6 fL (ref 78.0–100.0)
PLATELETS: 302 10*3/uL (ref 150–440)
Platelets: 359 10*3/uL (ref 150–400)
RBC: 4.64 MIL/uL (ref 3.80–5.20)
RBC: 4.99 MIL/uL (ref 3.87–5.11)
RDW: 14.1 % (ref 11.5–14.5)
RDW: 14.2 % (ref 11.5–15.5)
WBC: 16.2 10*3/uL — AB (ref 4.0–10.5)
WBC: 9.1 10*3/uL (ref 3.6–11.0)

## 2017-06-21 LAB — COMPREHENSIVE METABOLIC PANEL
ALK PHOS: 80 U/L (ref 38–126)
ALT: 19 U/L (ref 14–54)
ALT: 20 U/L (ref 14–54)
ANION GAP: 14 (ref 5–15)
AST: 20 U/L (ref 15–41)
AST: 21 U/L (ref 15–41)
Albumin: 3.6 g/dL (ref 3.5–5.0)
Albumin: 3.7 g/dL (ref 3.5–5.0)
Alkaline Phosphatase: 72 U/L (ref 38–126)
Anion gap: 8 (ref 5–15)
BILIRUBIN TOTAL: 0.7 mg/dL (ref 0.3–1.2)
BUN: 12 mg/dL (ref 6–20)
BUN: 13 mg/dL (ref 6–20)
CALCIUM: 9.8 mg/dL (ref 8.9–10.3)
CO2: 25 mmol/L (ref 22–32)
CO2: 30 mmol/L (ref 22–32)
CREATININE: 1.07 mg/dL — AB (ref 0.44–1.00)
Calcium: 8.8 mg/dL — ABNORMAL LOW (ref 8.9–10.3)
Chloride: 103 mmol/L (ref 101–111)
Chloride: 99 mmol/L — ABNORMAL LOW (ref 101–111)
Creatinine, Ser: 1.26 mg/dL — ABNORMAL HIGH (ref 0.44–1.00)
GFR calc non Af Amer: 57 mL/min — ABNORMAL LOW (ref >60)
GFR, EST AFRICAN AMERICAN: 55 mL/min — AB (ref >60)
GFR, EST NON AFRICAN AMERICAN: 47 mL/min — AB (ref >60)
Glucose, Bld: 119 mg/dL — ABNORMAL HIGH (ref 65–99)
Glucose, Bld: 171 mg/dL — ABNORMAL HIGH (ref 65–99)
POTASSIUM: 4 mmol/L (ref 3.5–5.1)
Potassium: 3.5 mmol/L (ref 3.5–5.1)
Sodium: 138 mmol/L (ref 135–145)
Sodium: 141 mmol/L (ref 135–145)
TOTAL PROTEIN: 7.5 g/dL (ref 6.5–8.1)
Total Bilirubin: 0.5 mg/dL (ref 0.3–1.2)
Total Protein: 7.3 g/dL (ref 6.5–8.1)

## 2017-06-21 LAB — LIPASE, BLOOD
Lipase: 23 U/L (ref 11–51)
Lipase: 28 U/L (ref 11–51)

## 2017-06-21 LAB — I-STAT BETA HCG BLOOD, ED (MC, WL, AP ONLY)

## 2017-06-21 LAB — TROPONIN I: Troponin I: <0.03 ng/mL (ref <0.03)

## 2017-06-21 MED ORDER — FOSFOMYCIN TROMETHAMINE 3 G PO PACK
3.0000 g | PACK | Freq: Once | ORAL | Status: AC
  Administered 2017-06-21: 3 g via ORAL
  Filled 2017-06-21: qty 3

## 2017-06-21 MED ORDER — IOPAMIDOL (ISOVUE-300) INJECTION 61%
100.0000 mL | Freq: Once | INTRAVENOUS | Status: AC | PRN
  Administered 2017-06-21: 100 mL via INTRAVENOUS

## 2017-06-21 MED ORDER — MORPHINE SULFATE (PF) 4 MG/ML IV SOLN
4.0000 mg | Freq: Once | INTRAVENOUS | Status: AC
  Administered 2017-06-21: 4 mg via INTRAVENOUS
  Filled 2017-06-21: qty 1

## 2017-06-21 MED ORDER — LACTULOSE 10 GM/15ML PO SOLN: 20.0000 g | Freq: Every day | ORAL | 0 refills | Status: DC | PRN

## 2017-06-21 MED ORDER — SODIUM CHLORIDE 0.9 % IV BOLUS (SEPSIS)
1000.0000 mL | Freq: Once | INTRAVENOUS | Status: AC
  Administered 2017-06-21: 1000 mL via INTRAVENOUS

## 2017-06-21 MED ORDER — ONDANSETRON 4 MG PO TBDP: 4.0000 mg | ORAL_TABLET | Freq: Three times a day (TID) | ORAL | 0 refills | Status: DC | PRN

## 2017-06-21 MED ORDER — DICYCLOMINE HCL 20 MG PO TABS: 20.0000 mg | ORAL_TABLET | Freq: Four times a day (QID) | ORAL | 0 refills | Status: DC | PRN

## 2017-06-21 MED ORDER — ONDANSETRON HCL 4 MG/2ML IJ SOLN
4.0000 mg | Freq: Once | INTRAMUSCULAR | Status: AC
  Administered 2017-06-21: 4 mg via INTRAVENOUS
  Filled 2017-06-21: qty 2

## 2017-06-21 NOTE — ED Triage Notes (Addendum)
Patient reports constipation with generalized abdominal pain onset this week with nausea , she self administered enema this evening with no relief , she was seen at [facility] last night for the same complaints - abdominal x-ray /blood tests and urine test done .

## 2017-06-21 NOTE — ED Provider Notes (Signed)
[facility] Emergency Department Provider Note   ____________________________________________   First MD Initiated Contact with Patient 06/21/17 0315     (approximate)  I have reviewed the triage vital signs and the nursing notes.   HISTORY  Chief Complaint Abdominal Pain    HPI [name] is a 57 y.o. female who presents to the ED from home with a chief complaint of abdominal pain and nausea.  Patient reports left-sided abdominal pain approximately 6 PM after eating a biscuit.  History of SBO and states this feels similarly.  Denies fever, chills, chest pain, shortness of breath, vomiting, diarrhea.  Last bowel movement yesterday.  Has not noticed passing gas today.  Denies recent travel or trauma.   Past Medical History:  Diagnosis Date  . Borderline diabetic   . Bowel obstruction (HCC)   . Depression   . Diabetes mellitus without complication (HCC)   . Ectopic pregnancy   . Hypercholesteremia   . Hypertension   . Sleep disorder     Patient Active Problem List   Diagnosis Date Noted  . SBO (small bowel obstruction) (HCC) 11/04/2013  . Partial small bowel obstruction (HCC) 11/04/2013  . Enteritis 11/04/2013  . Diabetes mellitus (HCC) 11/04/2013  . Anxiety state, unspecified 11/04/2013  . Chronic abdominal pain 11/04/2013  . Essential hypertension, benign 11/04/2013  . Bloating 06/05/2013  . Abdominal pain, unspecified site 06/04/2013    Past Surgical History:  Procedure Laterality Date  . ABDOMINAL HYSTERECTOMY  2003  . CESAREAN SECTION  2003  . COLONOSCOPY    . ECTOPIC PREGNANCY SURGERY     tubal removel  . ESOPHAGOGASTRODUODENOSCOPY    . EXPLORATORY LAPAROTOMY    . TONSILLECTOMY    . UTERINE FIBROID SURGERY      Prior to Admission medications   Medication Sig Start Date End Date Taking? Authorizing Provider  clonazePAM (KLONOPIN) 0.5 MG tablet Take 0.5 mg by mouth at bedtime as needed for anxiety.     [provider]  dicyclomine (BENTYL) 20 MG tablet Take 1 tablet (20 mg total) by mouth every 6 (six) hours as needed. 06/21/17   Irean Hong, MD  hyoscyamine (LEVSIN SL) 0.125 MG SL tablet Place 1 tablet (0.125 mg total) under the tongue every 4 (four) hours as needed for cramping. 10/22/12   Mardella Layman, MD  lactulose (CHRONULAC) 10 GM/15ML solution Take 30 mLs (20 g total) by mouth daily as needed for mild constipation. 06/21/17   Irean Hong, MD  losartan (COZAAR) 50 MG tablet Take 50 mg by mouth daily.    [provider]  metFORMIN (GLUCOPHAGE) 500 MG tablet Take 500 mg by mouth 2 (two) times daily with a meal.  05/11/13   [provider]  metoCLOPramide (REGLAN) 10 MG tablet Take 1 tablet (10 mg total) by mouth every 6 (six) hours as needed for nausea or vomiting. 04/17/16   Doug Sou, MD  ondansetron (ZOFRAN ODT) 4 MG disintegrating tablet Take 1 tablet (4 mg total) by mouth every 8 (eight) hours as needed for nausea or vomiting. 06/21/17   Irean Hong, MD  sertraline (ZOLOFT) 50 MG tablet Take 50 mg by mouth daily.  05/08/13   [provider]  simvastatin (ZOCOR) 40 MG tablet Take 40 mg by mouth every evening.    [provider]  zolpidem (AMBIEN) 5 MG tablet Take 5 mg by mouth at bedtime as needed for sleep.  04/11/13   [provider]  Allergies Amoxicillin; Strawberry extract; Ibuprofen; and Tetracyclines & related  Family History  Problem Relation Age of Onset  . Diabetes Mother   . Colon cancer Neg Hx     Social History Social History   Tobacco Use  . Smoking status: Never Smoker  . Smokeless tobacco: Never Used  Substance Use Topics  . Alcohol use: No  . Drug use: No    Review of Systems  Constitutional: No fever/chills. Eyes: No visual changes. ENT: No sore throat. Cardiovascular: Denies chest pain. Respiratory: Denies shortness of breath. Gastrointestinal: Positive for abdominal pain and nausea, no vomiting.  No diarrhea.   No constipation. Genitourinary: Negative for dysuria. Musculoskeletal: Negative for back pain. Skin: Negative for rash. Neurological: Negative for headaches, focal weakness or numbness.   ____________________________________________   PHYSICAL EXAM:  VITAL SIGNS: ED Triage Vitals  Enc Vitals Group     BP 06/20/17 2339 (!) 151/83     Pulse Rate 06/20/17 2339 75     Resp 06/20/17 2339 18     Temp 06/20/17 2339 98.1 F (36.7 C)     Temp Source 06/20/17 2339 Oral     SpO2 06/20/17 2339 99 %     Weight 06/20/17 2340 177 lb (80.3 kg)     Height 06/20/17 2340 5' 3.5" (1.613 m)     Head Circumference --      Peak Flow --      Pain Score 06/20/17 2344 10     Pain Loc --      Pain Edu? --      Excl. in GC? --     Constitutional: Asleep, awakened for exam.  Alert and oriented. Well appearing and in no acute distress. Eyes: Conjunctivae are normal. PERRL. EOMI. Head: Atraumatic. Nose: No congestion/rhinnorhea. Mouth/Throat: Mucous membranes are moist.  Oropharynx non-erythematous. Neck: No stridor.  No carotid bruits. Cardiovascular: Normal rate, regular rhythm. Grossly normal heart sounds.  Good peripheral circulation. Respiratory: Normal respiratory effort.  No retractions. Lungs CTAB. Gastrointestinal: Soft and mildly diffusely tender to palpation without rebound or guarding. No distention. No abdominal bruits. No CVA tenderness. Musculoskeletal: No lower extremity tenderness nor edema.  No joint effusions. Neurologic:  Normal speech and language. No gross focal neurologic deficits are appreciated. No gait instability. Skin:  Skin is warm, dry and intact. No rash noted. Psychiatric: Mood and affect are normal. Speech and behavior are normal.  ____________________________________________   LABS (all labs ordered are listed, but only abnormal results are displayed)  Labs Reviewed  COMPREHENSIVE METABOLIC PANEL - Abnormal; Notable for the following components:      Result  Value   Glucose, Bld 119 (*)    Creatinine, Ser 1.07 (*)    Calcium 8.8 (*)    GFR calc non Af Amer 57 (*)    All other components within normal limits  CBC - Abnormal; Notable for the following components:   Hemoglobin 11.5 (*)    MCV 78.6 (*)    MCH 24.8 (*)    MCHC 31.6 (*)    All other components within normal limits  URINALYSIS, COMPLETE (UACMP) WITH MICROSCOPIC - Abnormal; Notable for the following components:   Color, Urine YELLOW (*)    APPearance HAZY (*)    Hgb urine dipstick SMALL (*)    Leukocytes, UA SMALL (*)    Squamous Epithelial / LPF 0-5 (*)    All other components within normal limits  LIPASE, BLOOD  TROPONIN I   ____________________________________________  EKG  ED ECG  REPORT I, Royalti Schauf J, the attending physician, personally viewed and interpreted this ECG.   Date: 06/21/2017  EKG Time: 2336  Rate: 75  Rhythm: normal EKG, normal sinus rhythm  Axis: Normal  Intervals:right bundle branch block  ST&T Change: Nonspecific  ____________________________________________  RADIOLOGY  ED MD interpretation: No SBO.  I personally visualized the x-rays; moderate stool burden noted.  Official radiology report(s): Dg Abd Acute W/chest  Result Date: 06/21/2017 CLINICAL DATA:  57 year old female with abdominal pain. EXAM: DG ABDOMEN ACUTE W/ 1V CHEST COMPARISON:  Abdominal CT dated 04/27/2016 FINDINGS: There is no evidence of dilated bowel loops or free intraperitoneal air. No radiopaque calculi or other significant radiographic abnormality is seen. Heart size and mediastinal contours are within normal limits. Both lungs are clear. IMPRESSION: Negative abdominal radiographs.  No acute cardiopulmonary disease. Electronically Signed   By: Elgie CollardArash  Radparvar M.D.   On: 06/21/2017 04:00    ____________________________________________   PROCEDURES  Procedure(s) performed: None  Procedures  Critical Care performed:  No  ____________________________________________   INITIAL IMPRESSION / ASSESSMENT AND PLAN / ED COURSE  As part of my medical decision making, I reviewed the following data within the electronic MEDICAL RECORD NUMBER Nursing notes reviewed and incorporated, Labs reviewed, Old chart reviewed, Radiograph reviewed  and Notes from prior ED visits   57 year old female who presents with left-sided abdominal pain and nausea approximately 6 PM. Differential diagnosis includes, but is not limited to, biliary disease (biliary colic, acute cholecystitis, cholangitis, choledocholithiasis, etc), intrathoracic causes for epigastric abdominal pain including ACS, gastritis, duodenitis, pancreatitis, small bowel or large bowel obstruction, abdominal aortic aneurysm, hernia, and gastritis.  Patient reports improvement of pain if she received Percocet prior to examination.  States no nausea currently.  Notes abdominal distention has resolved.  Laboratory results unremarkable.  Will obtain x-rays to evaluate for SBO.  Clinical Course as of Jun 22 510  Thu Jun 21, 2017  [id number] Patient resting no acute distress.  Updated her of x-ray imaging results.  Awaiting urine results.  [JS]  [id number] Updated patient of urinalysis result.  Will give 1 dose of fosfomycin prior to discharge.  Will discharge home with as needed lactulose, Bentyl and Zofran.  Patient will follow-up closely with her PCP early next week.  Return precautions given.  Patient verbalizes understanding and agrees with plan of care.  [JS]    Clinical Course User Index [JS] Irean HongSung, Jovonne Wilton J, MD     ____________________________________________   FINAL CLINICAL IMPRESSION(S) / ED DIAGNOSES  Final diagnoses:  Generalized abdominal pain  Constipation, unspecified constipation type  Lower urinary tract infectious disease     ED Discharge Orders        Ordered    dicyclomine (BENTYL) 20 MG tablet  Every 6 hours PRN     06/21/17 0448    lactulose  (CHRONULAC) 10 GM/15ML solution  Daily PRN     06/21/17 0448    ondansetron (ZOFRAN ODT) 4 MG disintegrating tablet  Every 8 hours PRN     06/21/17 0448       Note:  This document was prepared using Dragon voice recognition software and may include unintentional dictation errors.    Irean HongSung, Augustina Braddock J, MD 06/21/17 [phone]

## 2017-06-21 NOTE — ED Notes (Signed)
Pt left department and went to [place], [facility]

## 2017-06-21 NOTE — ED Provider Notes (Signed)
[facility] Emergency Department Provider Note    First MD Initiated Contact with Patient 06/21/17 2305     (approximate)  I have reviewed the triage vital signs and the nursing notes.   HISTORY  Chief Complaint Abdominal Pain    HPI [name] is a 57 y.o. female with below list of chronic medical conditions including multiple abdominal surgeries with previous bowel obstruction presents with generalized abdominal pain at this time that is currently 10 out of 10 associated with nausea.  Patient was seen yesterday and diagnosed with constipation.  Patient states that she did indeed have a bowel movement but pain has dramatically worsened over the course of today.  Patient denies any fever afebrile on presentation with temperature 98.8.  apparent discomfort at this time.   Past Medical History:  Diagnosis Date  . Borderline diabetic   . Bowel obstruction (HCC)   . Depression   . Diabetes mellitus without complication (HCC)   . Ectopic pregnancy   . Hypercholesteremia   . Hypertension   . Sleep disorder     Patient Active Problem List   Diagnosis Date Noted  . SBO (small bowel obstruction) (HCC) 11/04/2013  . Partial small bowel obstruction (HCC) 11/04/2013  . Enteritis 11/04/2013  . Diabetes mellitus (HCC) 11/04/2013  . Anxiety state, unspecified 11/04/2013  . Chronic abdominal pain 11/04/2013  . Essential hypertension, benign 11/04/2013  . Bloating 06/05/2013  . Abdominal pain, unspecified site 06/04/2013    Past Surgical History:  Procedure Laterality Date  . ABDOMINAL HYSTERECTOMY  2003  . CESAREAN SECTION  2003  . COLONOSCOPY    . ECTOPIC PREGNANCY SURGERY     tubal removel  . ESOPHAGOGASTRODUODENOSCOPY    . EXPLORATORY LAPAROTOMY    . TONSILLECTOMY    . UTERINE FIBROID SURGERY      Prior to Admission medications   Medication Sig Start Date End Date Taking? Authorizing Provider  clonazePAM (KLONOPIN) 0.5 MG tablet Take 0.5 mg  by mouth at bedtime as needed for anxiety.     [provider]  dicyclomine (BENTYL) 20 MG tablet Take 1 tablet (20 mg total) by mouth every 6 (six) hours as needed. 06/21/17   Irean Hong, MD  hyoscyamine (LEVSIN SL) 0.125 MG SL tablet Place 1 tablet (0.125 mg total) under the tongue every 4 (four) hours as needed for cramping. 10/22/12   Mardella Layman, MD  lactulose (CHRONULAC) 10 GM/15ML solution Take 30 mLs (20 g total) by mouth daily as needed for mild constipation. 06/21/17   Irean Hong, MD  losartan (COZAAR) 50 MG tablet Take 50 mg by mouth daily.    [provider]  metFORMIN (GLUCOPHAGE) 500 MG tablet Take 500 mg by mouth 2 (two) times daily with a meal.  05/11/13   [provider]  metoCLOPramide (REGLAN) 10 MG tablet Take 1 tablet (10 mg total) by mouth every 6 (six) hours as needed for nausea or vomiting. 04/17/16   Doug Sou, MD  ondansetron (ZOFRAN ODT) 4 MG disintegrating tablet Take 1 tablet (4 mg total) by mouth every 8 (eight) hours as needed for nausea or vomiting. 06/21/17   Irean Hong, MD  sertraline (ZOLOFT) 50 MG tablet Take 50 mg by mouth daily.  05/08/13   [provider]  simvastatin (ZOCOR) 40 MG tablet Take 40 mg by mouth every evening.    [provider]  zolpidem (AMBIEN) 5 MG tablet Take 5 mg by mouth at bedtime  as needed for sleep.  04/11/13   [provider]    Allergies Amoxicillin; Strawberry extract; Ibuprofen; and Tetracyclines & related  Family History  Problem Relation Age of Onset  . Diabetes Mother   . Colon cancer Neg Hx     Social History Social History   Tobacco Use  . Smoking status: Never Smoker  . Smokeless tobacco: Never Used  Substance Use Topics  . Alcohol use: No  . Drug use: No    Review of Systems Constitutional: No fever/chills Eyes: No visual changes. ENT: No sore throat. Cardiovascular: Denies chest pain. Respiratory: Denies shortness of breath. Gastrointestinal:  Positive for generalized abdominal pain and nausea.   Genitourinary: Negative for dysuria. Musculoskeletal: Negative for neck pain.  Negative for back pain. Integumentary: Negative for rash. Neurological: Negative for headaches, focal weakness or numbness.   ____________________________________________   PHYSICAL EXAM:  VITAL SIGNS: ED Triage Vitals  Enc Vitals Group     BP 06/21/17 2230 130/78     Pulse Rate 06/21/17 2230 86     Resp 06/21/17 2230 18     Temp 06/21/17 2230 98.8 F (37.1 C)     Temp Source 06/21/17 2230 Oral     SpO2 06/21/17 2230 99 %     Weight 06/21/17 2236 79.4 kg (175 lb)     Height 06/21/17 2236 1.651 m (5\' 5" )     Head Circumference --      Peak Flow --      Pain Score 06/21/17 2236 10     Pain Loc --      Pain Edu? --      Excl. in GC? --     Constitutional: Alert and oriented.  Apparent discomfort eyes: Conjunctivae are normal. Head: Atraumatic. Mouth/Throat: Mucous membranes are moist.  Oropharynx non-erythematous. Neck: No stridor.  No meningeal signs.  No cervical spine tenderness to palpation. Cardiovascular: Normal rate, regular rhythm. Good peripheral circulation. Grossly normal heart sounds. Respiratory: Normal respiratory effort.  No retractions. Lungs CTAB. Gastrointestinal: Generalized tenderness to palpation distention. Musculoskeletal: No lower extremity tenderness nor edema. No gross deformities of extremities. Neurologic:  Normal speech and language. No gross focal neurologic deficits are appreciated.  Skin:  Skin is warm, dry and intact. No rash noted. Psychiatric: Mood and affect are normal. Speech and behavior are normal.  ____________________________________________   LABS (all labs ordered are listed, but only abnormal results are displayed)  Labs Reviewed  URINALYSIS, COMPLETE (UACMP) WITH MICROSCOPIC - Abnormal; Notable for the following components:      Result Value   Color, Urine AMBER (*)    APPearance CLOUDY (*)      Ketones, ur 20 (*)    Protein, ur 30 (*)    Squamous Epithelial / LPF 0-5 (*)    All other components within normal limits   ____________________________________________  EKG  ED ECG REPORT I, [place] N Keenen Roessner, the attending physician, personally viewed and interpreted this ECG.   Date: 06/22/2017  EKG Time: 9:21 PM  Rate: 94  Rhythm: Normal sinus rhythm  Axis: Normal  Intervals: Normal  ST&T Change: None  ____________________________________________  RADIOLOGY I, [place] N Devetta Hagenow, personally viewed and evaluated these images (plain radiographs) as part of my medical decision making, as well as reviewing the written report by the radiologist.  ED MD interpretation:    Official radiology report(s): Ct Abdomen Pelvis W Contrast  Result Date: 06/22/2017 CLINICAL DATA:  57 year old female with abdominal pain. EXAM: CT ABDOMEN AND PELVIS WITH  CONTRAST TECHNIQUE: Multidetector CT imaging of the abdomen and pelvis was performed using the standard protocol following bolus administration of intravenous contrast. CONTRAST:  ISOVUE-300 IOPAMIDOL (ISOVUE-300) INJECTION 61% COMPARISON:  Abdominal radiograph dated 06/21/2017 and CT dated 04/17/2016 FINDINGS: Lower chest: The visualized lung bases are clear. No intra-abdominal free air.  Small free fluid within the pelvis. Hepatobiliary: Probable fatty infiltration of the liver. No intrahepatic biliary ductal dilatation. Probable focal area of adenomyomatosis in the gallbladder fundus. No calcified gallstone. The Pancreas: Unremarkable. No pancreatic ductal dilatation or surrounding inflammatory changes. Spleen: Normal in size without focal abnormality. Adrenals/Urinary Tract: The adrenal glands are unremarkable. Duplicated left renal collecting system. There is no hydronephrosis on either side. There is symmetric enhancement and excretion of contrast by both kidneys. The visualized ureters and urinary bladder appear unremarkable.  Stomach/Bowel: The stomach is mildly distended with fluid content. Multiple dilated and fluid-filled loops of small bowel throughout the abdomen measure up to 3.3 cm in diameter. The distal small bowel within the pelvis and terminal ileum are collapsed. A transition zone is seen in the anterior pelvis (series 2, image 61) secondary to peritoneal adhesions. There is moderate stool throughout the colon. The appendix is normal. Vascular/Lymphatic: Mild aortoiliac atherosclerotic disease. No portal venous gas. There is no adenopathy. Reproductive: Hysterectomy. Other: None Musculoskeletal: Mild degenerative changes of the spine. No acute osseous pathology. IMPRESSION: Small-bowel obstruction with transition in the midline anterior lower abdomen/pelvis secondary to peritoneal adhesions. Electronically Signed   By: Elgie Collard M.D.   On: 06/22/2017 00:28   Dg Abd Acute W/chest  Result Date: 06/21/2017 CLINICAL DATA:  57 year old female with abdominal pain. EXAM: DG ABDOMEN ACUTE W/ 1V CHEST COMPARISON:  Abdominal CT dated 04/27/2016 FINDINGS: There is no evidence of dilated bowel loops or free intraperitoneal air. No radiopaque calculi or other significant radiographic abnormality is seen. Heart size and mediastinal contours are within normal limits. Both lungs are clear. IMPRESSION: Negative abdominal radiographs.  No acute cardiopulmonary disease. Electronically Signed   By: Elgie Collard M.D.   On: 06/21/2017 04:00     Procedures   ____________________________________________   INITIAL IMPRESSION / ASSESSMENT AND PLAN / ED COURSE  As part of my medical decision making, I reviewed the following data within the electronic MEDICAL RECORD NUMBER   56 year old female present with above-stated history and physical exam concerning for possible bowel obstruction.  Patient given IV morphine 4 mg and Zofran 4 mg with some improvement of pain.  However on reevaluation following CT scan patient pain increasing  again.  CT scan of the abdomen and pelvis was performed which was consistent with the aforementioned finding of a small bowel obstruction.  NG tube inserted.  Patient discussed with Dr. Aleen Campi general surgeon on call for further evaluation and management of a small bowel obstruction. ____________________________________________  FINAL CLINICAL IMPRESSION(S) / ED DIAGNOSES  Final diagnoses:  Small bowel obstruction (HCC)     MEDICATIONS GIVEN DURING THIS VISIT:  Medications  morphine 4 MG/ML injection 4 mg (4 mg Intravenous Given 06/21/17 2319)  ondansetron (ZOFRAN) injection 4 mg (4 mg Intravenous Given 06/21/17 2319)  sodium chloride 0.9 % bolus 1,000 mL (0 mLs Intravenous Stopped 06/22/17 0022)  iopamidol (ISOVUE-300) 61 % injection 100 mL (100 mLs Intravenous Contrast Given 06/21/17 2345)     ED Discharge Orders    None       Note:  This document was prepared using Dragon voice recognition software and may include unintentional dictation errors.  Darci CurrentBrown, [place] N, MD 06/22/17 [phone]

## 2017-06-21 NOTE — Discharge Instructions (Addendum)
1.  You may take laxative as needed for bowel movements (Lactulose). 2.  You may take medicines as needed for abdominal discomfort and nausea (Bentyl/Zofran #20). 3.  Drink plenty of fluids daily. 4.  You were treated with a one-time dose of antibiotics tonight for your mild UTI. 5.  Return to the ER for worsening symptoms, persistent vomiting, difficulty breathing or other concerns.

## 2017-06-21 NOTE — ED Triage Notes (Addendum)
Pt to triage via w/c with no distress noted; pt seen last night for abd pain and dx with constipation; rx lactulose and bentyl but pain persists; st "some stool" at 4pm; denies vomiting; pt [facility] but she left after being triaged since she wanted to come back here; [facility]; charge nurse notified of increase in WBC since visit here

## 2017-06-22 ENCOUNTER — Emergency Department: Payer: Federal, State, Local not specified - PPO

## 2017-06-22 ENCOUNTER — Other Ambulatory Visit: Payer: Self-pay

## 2017-06-22 DIAGNOSIS — Z9102 Food additives allergy status: Secondary | ICD-10-CM | POA: Diagnosis not present

## 2017-06-22 DIAGNOSIS — Z886 Allergy status to analgesic agent status: Secondary | ICD-10-CM | POA: Diagnosis not present

## 2017-06-22 DIAGNOSIS — K5652 Intestinal adhesions [bands] with complete obstruction: Secondary | ICD-10-CM | POA: Diagnosis present

## 2017-06-22 DIAGNOSIS — F329 Major depressive disorder, single episode, unspecified: Secondary | ICD-10-CM | POA: Diagnosis present

## 2017-06-22 DIAGNOSIS — I10 Essential (primary) hypertension: Secondary | ICD-10-CM | POA: Diagnosis present

## 2017-06-22 DIAGNOSIS — K3184 Gastroparesis: Secondary | ICD-10-CM | POA: Diagnosis present

## 2017-06-22 DIAGNOSIS — K56609 Unspecified intestinal obstruction, unspecified as to partial versus complete obstruction: Secondary | ICD-10-CM | POA: Diagnosis not present

## 2017-06-22 DIAGNOSIS — E860 Dehydration: Secondary | ICD-10-CM | POA: Diagnosis present

## 2017-06-22 DIAGNOSIS — Z4682 Encounter for fitting and adjustment of non-vascular catheter: Secondary | ICD-10-CM | POA: Diagnosis not present

## 2017-06-22 DIAGNOSIS — Z881 Allergy status to other antibiotic agents status: Secondary | ICD-10-CM | POA: Diagnosis not present

## 2017-06-22 DIAGNOSIS — Z7984 Long term (current) use of oral hypoglycemic drugs: Secondary | ICD-10-CM | POA: Diagnosis not present

## 2017-06-22 DIAGNOSIS — E7800 Pure hypercholesterolemia, unspecified: Secondary | ICD-10-CM | POA: Diagnosis present

## 2017-06-22 DIAGNOSIS — G4733 Obstructive sleep apnea (adult) (pediatric): Secondary | ICD-10-CM | POA: Diagnosis present

## 2017-06-22 DIAGNOSIS — Z79899 Other long term (current) drug therapy: Secondary | ICD-10-CM | POA: Diagnosis not present

## 2017-06-22 DIAGNOSIS — E1143 Type 2 diabetes mellitus with diabetic autonomic (poly)neuropathy: Secondary | ICD-10-CM | POA: Diagnosis present

## 2017-06-22 HISTORY — DX: Unspecified intestinal obstruction, unspecified as to partial versus complete obstruction: K56.609

## 2017-06-22 LAB — BASIC METABOLIC PANEL
Anion gap: 9 (ref 5–15)
BUN: 17 mg/dL (ref 6–20)
CALCIUM: 8.7 mg/dL — AB (ref 8.9–10.3)
CO2: 27 mmol/L (ref 22–32)
CREATININE: 1.05 mg/dL — AB (ref 0.44–1.00)
Chloride: 105 mmol/L (ref 101–111)
GFR, EST NON AFRICAN AMERICAN: 59 mL/min — AB (ref >60)
Glucose, Bld: 109 mg/dL — ABNORMAL HIGH (ref 65–99)
Potassium: 4 mmol/L (ref 3.5–5.1)
SODIUM: 141 mmol/L (ref 135–145)

## 2017-06-22 LAB — CBC WITH DIFFERENTIAL/PLATELET
BASOS PCT: 1 %
Basophils Absolute: 0 10*3/uL (ref 0–0.1)
EOS ABS: 0 10*3/uL (ref 0–0.7)
EOS PCT: 0 %
HCT: 34.9 % — ABNORMAL LOW (ref 35.0–47.0)
HEMOGLOBIN: 11.2 g/dL — AB (ref 12.0–16.0)
LYMPHS ABS: 2 10*3/uL (ref 1.0–3.6)
Lymphocytes Relative: 19 %
MCH: 25.1 pg — AB (ref 26.0–34.0)
MCHC: 32 g/dL (ref 32.0–36.0)
MCV: 78.3 fL — ABNORMAL LOW (ref 80.0–100.0)
Monocytes Absolute: 0.6 10*3/uL (ref 0.2–0.9)
Monocytes Relative: 6 %
NEUTROS PCT: 74 %
Neutro Abs: 7.7 10*3/uL — ABNORMAL HIGH (ref 1.4–6.5)
PLATELETS: 311 10*3/uL (ref 150–440)
RBC: 4.46 MIL/uL (ref 3.80–5.20)
RDW: 14.3 % (ref 11.5–14.5)
WBC: 10.4 10*3/uL (ref 3.6–11.0)

## 2017-06-22 LAB — GLUCOSE, CAPILLARY
GLUCOSE-CAPILLARY: 111 mg/dL — AB (ref 65–99)
GLUCOSE-CAPILLARY: 111 mg/dL — AB (ref 65–99)
GLUCOSE-CAPILLARY: 84 mg/dL (ref 65–99)
Glucose-Capillary: 100 mg/dL — ABNORMAL HIGH (ref 65–99)
Glucose-Capillary: 80 mg/dL (ref 65–99)
Glucose-Capillary: 75 mg/dL (ref 65–99)

## 2017-06-22 LAB — MAGNESIUM: MAGNESIUM: 2.3 mg/dL (ref 1.7–2.4)

## 2017-06-22 MED ORDER — MENTHOL 3 MG MT LOZG
1.0000 | LOZENGE | OROMUCOSAL | Status: DC | PRN
  Administered 2017-06-22 – 2017-06-23 (×3): 3 mg via ORAL
  Filled 2017-06-22: qty 9

## 2017-06-22 MED ORDER — INSULIN ASPART 100 UNIT/ML ~~LOC~~ SOLN
0.0000 [IU] | SUBCUTANEOUS | Status: DC
  Filled 2017-06-22: qty 1

## 2017-06-22 MED ORDER — LACTATED RINGERS IV SOLN
125.0000 mL/h | INTRAVENOUS | Status: DC
  Administered 2017-06-22 – 2017-06-23 (×4): 125 mL/h via INTRAVENOUS

## 2017-06-22 MED ORDER — HYDROMORPHONE HCL 1 MG/ML IJ SOLN
0.5000 mg | INTRAMUSCULAR | Status: DC | PRN
  Administered 2017-06-22: 0.5 mg via INTRAVENOUS
  Filled 2017-06-22: qty 0.5

## 2017-06-22 MED ORDER — MORPHINE SULFATE (PF) 4 MG/ML IV SOLN
4.0000 mg | Freq: Once | INTRAVENOUS | Status: DC
  Filled 2017-06-22: qty 1

## 2017-06-22 MED ORDER — HYDRALAZINE HCL 20 MG/ML IJ SOLN: 10.0000 mg | Freq: Four times a day (QID) | INTRAMUSCULAR | Status: DC | PRN

## 2017-06-22 MED ORDER — ACETAMINOPHEN 325 MG PO TABS
650.0000 mg | ORAL_TABLET | ORAL | Status: DC | PRN
  Administered 2017-06-22 – 2017-06-23 (×2): 650 mg via ORAL
  Filled 2017-06-22 (×2): qty 2

## 2017-06-22 MED ORDER — ONDANSETRON HCL 4 MG/2ML IJ SOLN
4.0000 mg | Freq: Four times a day (QID) | INTRAMUSCULAR | Status: DC | PRN
  Administered 2017-06-22: 4 mg via INTRAVENOUS
  Filled 2017-06-22: qty 2

## 2017-06-22 MED ORDER — ONDANSETRON 4 MG PO TBDP: 4.0000 mg | ORAL_TABLET | Freq: Four times a day (QID) | ORAL | Status: DC | PRN

## 2017-06-22 MED ORDER — PANTOPRAZOLE SODIUM 40 MG IV SOLR
40.0000 mg | Freq: Every day | INTRAVENOUS | Status: DC
  Administered 2017-06-22 – 2017-06-24 (×4): 40 mg via INTRAVENOUS
  Filled 2017-06-22 (×4): qty 40

## 2017-06-22 MED ORDER — ENOXAPARIN SODIUM 40 MG/0.4ML ~~LOC~~ SOLN
40.0000 mg | SUBCUTANEOUS | Status: DC
  Administered 2017-06-22 – 2017-06-24 (×3): 40 mg via SUBCUTANEOUS
  Filled 2017-06-22 (×4): qty 0.4

## 2017-06-22 NOTE — H&P (Signed)
Date of Admission:  06/22/2017  Reason for Admission:  Small bowel obstruction  History of Present Illness: [name] is a 57 y.o. female who presents with a 1-2 day history of abdominal pain and nausea.  Patient had presented yesterday to the ED with symptoms and labs/KUB were normal and was discharged with enema.  However, she continues with abdominal distention and worsening pain and presented again to the hospital.  She reports her last bowel movement was on 3/12 and does not recall having any flatus since then either.  Reports having nausea and distention but no emesis.  Denies any fevers or chills, chest pain or shortness of breath.  Has a history of prior abdominal surgeries and has had two prior SBO episodes, with last one being in 2015.  She has not required surgery for her bowel obstruction episodes.  Past Medical History: Past Medical History:  Diagnosis Date  . Borderline diabetic   . Bowel obstruction (HCC)   . Depression   . Diabetes mellitus without complication (HCC)   . Ectopic pregnancy   . Hypercholesteremia   . Hypertension   . Sleep disorder      Past Surgical History: Past Surgical History:  Procedure Laterality Date  . ABDOMINAL HYSTERECTOMY  2003  . CESAREAN SECTION  2003  . COLONOSCOPY    . ECTOPIC PREGNANCY SURGERY     tubal removel  . ESOPHAGOGASTRODUODENOSCOPY    . EXPLORATORY LAPAROTOMY    . TONSILLECTOMY    . UTERINE FIBROID SURGERY      Home Medications: Prior to Admission medications   Medication Sig Start Date End Date Taking? Authorizing Provider  buPROPion (WELLBUTRIN XL) 300 MG 24 hr tablet Take 300 mg by mouth daily.   Yes [provider]  clonazePAM (KLONOPIN) 0.5 MG tablet Take 0.5 mg by mouth at bedtime as needed for anxiety.    Yes [provider]  dicyclomine (BENTYL) 20 MG tablet Take 1 tablet (20 mg total) by mouth every 6 (six) hours as needed. 06/21/17  Yes Irean HongSung, Jade J, MD  ferrous sulfate 325 (65 FE) MG  tablet Take 325 mg by mouth daily with breakfast.   Yes [provider]  irbesartan-hydrochlorothiazide (AVALIDE) 150-12.5 MG tablet Take 1 tablet by mouth daily.   Yes [provider]  lactulose (CHRONULAC) 10 GM/15ML solution Take 30 mLs (20 g total) by mouth daily as needed for mild constipation. 06/21/17  Yes Irean HongSung, Jade J, MD  losartan (COZAAR) 50 MG tablet Take 50 mg by mouth daily.   Yes [provider]  metFORMIN (GLUCOPHAGE) 500 MG tablet Take 500 mg by mouth 2 (two) times daily with a meal.  05/11/13  Yes [provider]  ondansetron (ZOFRAN ODT) 4 MG disintegrating tablet Take 1 tablet (4 mg total) by mouth every 8 (eight) hours as needed for nausea or vomiting. 06/21/17  Yes Irean HongSung, Jade J, MD  hyoscyamine (LEVSIN SL) 0.125 MG SL tablet Place 1 tablet (0.125 mg total) under the tongue every 4 (four) hours as needed for cramping. Patient not taking: Reported on 06/22/2017 10/22/12   Mardella LaymanPatterson, David R, MD  metoCLOPramide (REGLAN) 10 MG tablet Take 1 tablet (10 mg total) by mouth every 6 (six) hours as needed for nausea or vomiting. Patient not taking: Reported on 06/22/2017 04/17/16   Doug SouJacubowitz, Sam, MD  sertraline (ZOLOFT) 50 MG tablet Take 50 mg by mouth daily.  05/08/13   [provider]  simvastatin (ZOCOR) 40 MG tablet Take 40 mg  by mouth every evening.    [provider]  zolpidem (AMBIEN) 5 MG tablet Take 5 mg by mouth at bedtime as needed for sleep.  04/11/13   [provider]    Allergies: Allergies  Allergen Reactions  . Amoxicillin Anaphylaxis  . Strawberry Extract Shortness Of Breath and Swelling  . Ibuprofen Other (See Comments)    Acute renal failure  . Tetracyclines & Related Rash    Social History:  reports that  has never smoked. she has never used smokeless tobacco. She reports that she does not drink alcohol or use drugs.   Family History: Family History  Problem Relation Age of Onset  . Diabetes Mother   .  Colon cancer Neg Hx     Review of Systems: Review of Systems  Constitutional: Negative for chills and fever.  HENT: Negative for hearing loss.   Respiratory: Negative for shortness of breath.   Cardiovascular: Negative for chest pain.  Gastrointestinal: Positive for abdominal pain and nausea. Negative for constipation, diarrhea and vomiting.  Genitourinary: Negative for dysuria.  Musculoskeletal: Negative for myalgias.  Skin: Negative for rash.  Neurological: Negative for dizziness.  Psychiatric/Behavioral: Negative for depression.  All other systems reviewed and are negative.   Physical Exam BP (!) 155/94   Pulse 85   Temp 98.8 F (37.1 C) (Oral)   Resp 18   Ht 5\' 5"  (1.651 m)   Wt 79.4 kg (175 lb)   SpO2 94%   BMI 29.12 kg/m  CONSTITUTIONAL: No acute distress HEENT:  Normocephalic, atraumatic, extraocular motion intact. NECK: Trachea is midline, and there is no jugular venous distension.  RESPIRATORY:  Lungs are clear, and breath sounds are equal bilaterally. Normal respiratory effort without pathologic use of accessory muscles. CARDIOVASCULAR: Heart is regular without murmurs, gallops, or rubs. GI: The abdomen is soft, distended, with tenderness to palpation over left abdomen and some periumbilical. No peritoneal signs.  MUSCULOSKELETAL:  Normal muscle strength and tone in all four extremities.  No peripheral edema or cyanosis. SKIN: Skin turgor is normal. There are no pathologic skin lesions.  NEUROLOGIC:  Motor and sensation is grossly normal.  Cranial nerves are grossly intact. PSYCH:  Alert and oriented to person, place and time. Affect is normal.  Laboratory Analysis: Results for orders placed or performed during the hospital encounter of 06/21/17 (from the past 24 hour(s))  Urinalysis, Complete w Microscopic     Status: Abnormal   Collection Time: 06/21/17 10:46 PM  Result Value Ref Range   Color, Urine AMBER (A) YELLOW   APPearance CLOUDY (A) CLEAR   Specific  Gravity, Urine 1.028 1.005 - 1.030   pH 5.0 5.0 - 8.0   Glucose, UA NEGATIVE NEGATIVE mg/dL   Hgb urine dipstick NEGATIVE NEGATIVE   Bilirubin Urine NEGATIVE NEGATIVE   Ketones, ur 20 (A) NEGATIVE mg/dL   Protein, ur 30 (A) NEGATIVE mg/dL   Nitrite NEGATIVE NEGATIVE   Leukocytes, UA NEGATIVE NEGATIVE   RBC / HPF 6-30 0 - 5 RBC/hpf   WBC, UA 6-30 0 - 5 WBC/hpf   Bacteria, UA NONE SEEN NONE SEEN   Squamous Epithelial / LPF 0-5 (A) NONE SEEN   Mucus PRESENT    Na 138, K 4, Cl 99, CO2 25, BUN 13, Cr 1.26.  LFTs normal.  WBC 16.2, Hct 40.7, Plt 359  Imaging: Ct Abdomen Pelvis W Contrast  Result Date: 06/22/2017 CLINICAL DATA:  57 year old female with abdominal pain. EXAM: CT ABDOMEN AND PELVIS WITH CONTRAST TECHNIQUE: Multidetector  CT imaging of the abdomen and pelvis was performed using the standard protocol following bolus administration of intravenous contrast. CONTRAST:  ISOVUE-300 IOPAMIDOL (ISOVUE-300) INJECTION 61% COMPARISON:  Abdominal radiograph dated 06/21/2017 and CT dated 04/17/2016 FINDINGS: Lower chest: The visualized lung bases are clear. No intra-abdominal free air.  Small free fluid within the pelvis. Hepatobiliary: Probable fatty infiltration of the liver. No intrahepatic biliary ductal dilatation. Probable focal area of adenomyomatosis in the gallbladder fundus. No calcified gallstone. The Pancreas: Unremarkable. No pancreatic ductal dilatation or surrounding inflammatory changes. Spleen: Normal in size without focal abnormality. Adrenals/Urinary Tract: The adrenal glands are unremarkable. Duplicated left renal collecting system. There is no hydronephrosis on either side. There is symmetric enhancement and excretion of contrast by both kidneys. The visualized ureters and urinary bladder appear unremarkable. Stomach/Bowel: The stomach is mildly distended with fluid content. Multiple dilated and fluid-filled loops of small bowel throughout the abdomen measure up to 3.3 cm in  diameter. The distal small bowel within the pelvis and terminal ileum are collapsed. A transition zone is seen in the anterior pelvis (series 2, image 61) secondary to peritoneal adhesions. There is moderate stool throughout the colon. The appendix is normal. Vascular/Lymphatic: Mild aortoiliac atherosclerotic disease. No portal venous gas. There is no adenopathy. Reproductive: Hysterectomy. Other: None Musculoskeletal: Mild degenerative changes of the spine. No acute osseous pathology. IMPRESSION: Small-bowel obstruction with transition in the midline anterior lower abdomen/pelvis secondary to peritoneal adhesions. Electronically Signed   By: Elgie Collard M.D.   On: 06/22/2017 00:28     Assessment and Plan: This is a 57 y.o. female who presents with small bowel obstruction.  I have independently viewed the patient's imaging studies and reviewed her laboratory studies.  Overall, her CT scan does have dilated small bowel loops with distal decompression and transition point in the left abdomen where her pain is located.  She is slightly dehydrated with elevated Cr and hemoconcentration.  Discussed with the patient that she would be admitted to the surgical team.  She had an NG tube placed at bedside in the ED.  She will be NPO with IV fluid hydration and appropriate pain and nausea control.  Will have IV antihypertensives and sliding scale insulin.  Discussed that we would attempt conservative management for her SBO like during her previous episodes, though there is a chance that she may need surgery if there is no improvement in her symptoms.  Patient understands this plan and all of her questions have been answered.   Howie Ill, MD [facility]

## 2017-06-22 NOTE — Progress Notes (Signed)
SURGICAL PROGRESS NOTE (cpt [phone])  Hospital Day(s): 0.   Post op day(s):  Marland Kitchen   Interval History: Patient seen and examined, no acute events or new complaints since admission and insertion of NG tube overnight. Patient reports her abdominal pain and distention have both improved (though not entirely resolved) with complete resolution of her N/V, denies flatus, BM, fever/chills, CP, or SOB. Patient expresses interest in walking today.  Review of Systems:  Constitutional: denies fever, chills  HEENT: denies cough or congestion  Respiratory: denies any shortness of breath  Cardiovascular: denies chest pain or palpitations  Gastrointestinal: abdominal pain, N/V, and bowel function as per interval history Genitourinary: denies burning with urination or urinary frequency Musculoskeletal: denies pain, decreased motor or sensation Integumentary: denies any other rashes or skin discolorations Neurological: denies HA or vision/hearing changes   Vital signs in last 24 hours: [min-max] current  Temp:  [98.2 F (36.8 C)-99 F (37.2 C)] 99 F (37.2 C) (03/15 0451) Pulse Rate:  [71-86] 80 (03/15 0451) Resp:  [16-20] 20 (03/15 0451) BP: (130-168)/(67-101) 144/67 (03/15 0451) SpO2:  [94 %-100 %] 97 % (03/15 0451) Weight:  [175 lb (79.4 kg)] 175 lb (79.4 kg) (03/14 2236)     Height:  (165.1 cm) Weight: 175 lb (79.4 kg) BMI (Calculated): 29.12   Intake/Output this shift:  No intake/output data recorded.   Intake/Output last 2 shifts:  @   Physical Exam:  Constitutional: alert, cooperative and no distress  HENT: normocephalic without obvious abnormality  Eyes: PERRL, EOM's grossly intact and symmetric  Neuro: CN II - XII grossly intact and symmetric without deficit  Respiratory: breathing non-labored at rest  Cardiovascular: regular rate and sinus rhythm  Gastrointestinal: soft with mild epigastric abdominal tenderness to palpation and mild abdominal  distention Musculoskeletal: UE and LE FROM, no edema or wounds, motor and sensation grossly intact, NT   Labs:  CBC Latest Ref Rng & Units 06/22/2017 06/21/2017 06/20/2017  WBC 3.6 - 11.0 K/uL 10.4 16.2(H) 9.1  Hemoglobin 12.0 - 16.0 g/dL 11.2(L) 12.6 11.5(L)  Hematocrit 35.0 - 47.0 % 34.9(L) 40.7 36.5  Platelets 150 - 440 K/uL 311 359 302   CMP Latest Ref Rng & Units 06/22/2017 06/21/2017 06/20/2017  Glucose 65 - 99 mg/dL 604(V) 409(W) 119(J)  BUN 6 - 20 mg/dL Creatinine 0.44 - 1.00 mg/dL 4.78(G) 9.56(O) 1.30(Q)  Sodium 135 - 145 mmol/L 141 138 141  Potassium 3.5 - 5.1 mmol/L 4.0 4.0 3.5  Chloride 101 - 111 mmol/L 105 99(L) 103  CO2 22 - 32 mmol/L Calcium 8.9 - 10.3 mg/dL 6.5(H) 9.8 8.4(O)  Total Protein 6.5 - 8.1 g/dL - 7.5 7.3  Total Bilirubin 0.3 - 1.2 mg/dL - 0.7 0.5  Alkaline Phos 38 - 126 U/L - 80 72  AST 15 - 41 U/L - 20 21  ALT 14 - 54 U/L - 19 20   Imaging studies: No new pertinent imaging studies, though admission CT personally reviewed and discussed with patient   Assessment/Plan: (ICD-10's: K10.52) 57 y.o. 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  - NPO for now, IV fluids             - NG tube for nasogastric decompression             - monitor ongoing bowel function and  abdominal exam              - anticipate symptomatic relief within 24 - 48 hours following NGT insertion, followed by "rumbling" the following day and flatus either the same day or the day following the "rumbling" with anticipated length of stay ~3 - 5 days with successful non-operative management for 8 of 10 patients with small bowel obstruction attributed to post-surgical adhesions  - surgical intervention if doesn't improve was also  discussed             - medical management comorbidities             - ambulation encouraged              - DVT prophylaxis  -- Barbara Cower E. Earlene Plater, MD, RPVI [place]: [facility] General Surgery - Partnering for exceptional care. Office: [phone]

## 2017-06-22 NOTE — Progress Notes (Signed)
   06/22/17 1050  Clinical Encounter Type  Visited With Patient and family together  Visit Type Initial (order for advanced directive)  Referral From Nurse  Consult/Referral To Chaplain   Chaplain responded to order request for advanced directive.  Chaplain outlined document for patient and spouse, provided education regarding each section.  Chaplain facilitated dialogue regarding importance of conversations around patient wishes; patient/spouse spoke of family experiences relevant to the conversation. Patient and spouse will review the document.  Chaplain encouraged them to reach out with any questions or to assist in facilitating discussion.  Patient would like for a chaplain to follow up with them on Saturday to see if she is ready to complete the document.

## 2017-06-22 NOTE — Progress Notes (Signed)
[facility] "[facility]"; had to repeatedly ask for assistance in moving from stretcher to chair d/t severe abdominal pain; had fallen out of the chair and onto the floor, in the triage area, and no one assisted her up off the floor..."they just stared at me and told me that I shouldn't be in the floor... I told them that I would need help to get up...they told me that I would have to go back to the waiting area..I was in too much pain to go back out there.Marland Kitchen.Marland Kitchen.I called my sister to come get me and take me to [place]..." Karma Hiney K; 4:00 AM; 06/22/2017

## 2017-06-22 NOTE — ED Notes (Signed)
Patient transported to 226. 

## 2017-06-23 LAB — GLUCOSE, CAPILLARY
GLUCOSE-CAPILLARY: 81 mg/dL (ref 65–99)
GLUCOSE-CAPILLARY: 104 mg/dL — AB (ref 65–99)
Glucose-Capillary: 67 mg/dL (ref 65–99)
Glucose-Capillary: 136 mg/dL — ABNORMAL HIGH (ref 65–99)
Glucose-Capillary: 93 mg/dL (ref 65–99)
Glucose-Capillary: 91 mg/dL (ref 65–99)

## 2017-06-23 MED ORDER — DEXTROSE 50 % IV SOLN: 25.0000 mL | Freq: Once | INTRAVENOUS | Status: DC

## 2017-06-23 MED ORDER — DEXTROSE 50 % IV SOLN
25.0000 mL | INTRAVENOUS | Status: AC
  Administered 2017-06-23: 25 mL via INTRAVENOUS
  Filled 2017-06-23: qty 50

## 2017-06-23 MED ORDER — PHENOL 1.4 % MT LIQD
1.0000 | OROMUCOSAL | Status: DC | PRN
  Administered 2017-06-23: 1 via OROMUCOSAL
  Filled 2017-06-23: qty 177

## 2017-06-23 MED ORDER — BUPROPION HCL ER (XL) 150 MG PO TB24
300.0000 mg | ORAL_TABLET | Freq: Every day | ORAL | Status: DC
  Administered 2017-06-23 – 2017-06-24 (×2): 300 mg via ORAL
  Filled 2017-06-23 (×2): qty 2

## 2017-06-23 MED ORDER — KETOROLAC TROMETHAMINE 30 MG/ML IJ SOLN
30.0000 mg | Freq: Once | INTRAMUSCULAR | Status: AC
  Administered 2017-06-23: 30 mg via INTRAVENOUS
  Filled 2017-06-23: qty 1

## 2017-06-23 MED ORDER — KCL IN DEXTROSE-NACL 20-5-0.45 MEQ/L-%-% IV SOLN
INTRAVENOUS | Status: DC
  Administered 2017-06-23 – 2017-06-24 (×3): via INTRAVENOUS
  Filled 2017-06-23 (×6): qty 1000

## 2017-06-23 NOTE — Progress Notes (Addendum)
   Hypoglycemic Event  CBG: 67 at 7:54  Treatment: 25 mL of D50 SymptomsFollow-up CBG: None        Time:   8:37            CBG Result: 136  Possible Reasons for Event: NPO Comments/MD notified:    Reason Helzer H

## 2017-06-23 NOTE — Progress Notes (Signed)
SURGICAL PROGRESS NOTE (cpt [phone])  Hospital Day(s): 1.   Post op day(s):  Marland Kitchen   Interval History: Patient seen and examined, no acute events or new complaints overnight. Patient reports complete resolution of her abdominal pain with abdominal "rumbling" without flatus, has been ambulating, denies N/V, fever/chills, CP, or SOB.  Review of Systems:  Constitutional: denies fever, chills  HEENT: denies cough or congestion  Respiratory: denies any shortness of breath  Cardiovascular: denies chest pain or palpitations  Gastrointestinal: abdominal pain, N/V, and bowel function as per interval history Genitourinary: denies burning with urination or urinary frequency Musculoskeletal: denies pain, decreased motor or sensation Integumentary: denies any other rashes or skin discolorations Neurological: denies HA or vision/hearing changes   Vital signs in last 24 hours: [min-max] current  Temp:  [98.8 F (37.1 C)-100.8 F (38.2 C)] 98.8 F (37.1 C) (03/16 0429) Pulse Rate:  [85-93] 85 (03/16 0429) Resp:  [20] 20 (03/16 0429) BP: (127-133)/(66-75) 127/66 (03/16 0429) SpO2:  [96 %-97 %] 96 % (03/16 0429)     Height: 5\' 5"  (165.1 cm) Weight: 175 lb (79.4 kg) BMI (Calculated): 29.12   Intake/Output this shift:  No intake/output data recorded.   Intake/Output last 2 shifts:  @IOLAST2SHIFTS @   Physical Exam:  Constitutional: alert, cooperative and no distress  HENT: normocephalic without obvious abnormality  Eyes: PERRL, EOM's grossly intact and symmetric  Neuro: CN II - XII grossly intact and symmetric without deficit  Respiratory: breathing non-labored at rest  Cardiovascular: regular rate and sinus rhythm  Gastrointestinal: soft, non-tender, and non-distended Musculoskeletal: UE and LE FROM, no edema or wounds, motor and sensation grossly intact, NT   Labs:  CBC Latest Ref Rng & Units 06/22/2017 06/21/2017 06/20/2017  WBC 3.6 - 11.0 K/uL 10.4 16.2(H) 9.1  Hemoglobin 12.0 - 16.0 g/dL  11.2(L) 12.6 11.5(L)  Hematocrit 35.0 - 47.0 % 34.9(L) 40.7 36.5  Platelets 150 - 440 K/uL 311 359 302   CMP Latest Ref Rng & Units 06/22/2017 06/21/2017 06/20/2017  Glucose 65 - 99 mg/dL 604(V) 409(W) 119(J)  BUN 6 - 20 mg/dL 17 13 12   Creatinine 0.44 - 1.00 mg/dL 4.78(G) 9.56(O) 1.30(Q)  Sodium 135 - 145 mmol/L 141 138 141  Potassium 3.5 - 5.1 mmol/L 4.0 4.0 3.5  Chloride 101 - 111 mmol/L 105 99(L) 103  CO2 22 - 32 mmol/L 27 25 30   Calcium 8.9 - 10.3 mg/dL 6.5(H) 9.8 8.4(O)  Total Protein 6.5 - 8.1 g/dL - 7.5 7.3  Total Bilirubin 0.3 - 1.2 mg/dL - 0.7 0.5  Alkaline Phos 38 - 126 U/L - 80 72  AST 15 - 41 U/L - 20 21  ALT 14 - 54 U/L - 19 20   Imaging studies: No new pertinent imaging studies   Assessment/Plan: (ICD-10's: K56.52) 57 y.o. femalewith recurrent complete small bowel obstruction, likely attributable to post-surgical adhesions following hysterectomy in particular, complicated by pertinent comorbidities including DM, HTN, hypercholesterolemia, OSA, gastroparesis, IBD, and major depression disorder.  - NPO for now, IV fluids - NG tube for nasogastric decompression - monitor ongoing bowel function and abdominal exam  - anticipate symptomatic relief within 24 - 48 hours following NGT insertion, followed by "rumbling" the following day and flatus either the same day or the day following the "rumbling" with anticipated length of stay ~3 - 5 days with successful non-operative management for 8 of 10 patients with small bowel obstruction attributed to post-surgical adhesions             -  surgical intervention if doesn't improve was also discussed - medical management comorbidities - ambulation encouraged - DVT prophylaxis  All of the above findings and recommendations were discussed with the patient and her RN, and all of patient's questions were answered to her expressed  satisfaction.  -- Scherrie GerlachJason E. Earlene Plateravis, MD, RPVI [place]: [facility] General [facility] - Partnering for exceptional care. Office: [phone]

## 2017-06-23 NOTE — Progress Notes (Signed)
Right sided headache, no prior history. NG on right side. Declines narcotics, no relief w/ tylenol po. Will restart Wellbutrin po and give one time dose of Toradol. Renal function is fine.

## 2017-06-23 NOTE — Progress Notes (Signed)
Chaplain followed up with patient on AD. Patient would like to complete the AD prior to discharge. Patient is waiting for her husband before continuing. Chaplain asked to be paged when the patient is ready to complete.

## 2017-06-24 LAB — BASIC METABOLIC PANEL
Anion gap: 6 (ref 5–15)
BUN: 8 mg/dL (ref 6–20)
CHLORIDE: 107 mmol/L (ref 101–111)
CO2: 30 mmol/L (ref 22–32)
CREATININE: 0.86 mg/dL (ref 0.44–1.00)
Calcium: 8.4 mg/dL — ABNORMAL LOW (ref 8.9–10.3)
GFR calc non Af Amer: >60 mL/min (ref >60)
Glucose, Bld: 137 mg/dL — ABNORMAL HIGH (ref 65–99)
POTASSIUM: 3.4 mmol/L — AB (ref 3.5–5.1)
SODIUM: 143 mmol/L (ref 135–145)

## 2017-06-24 LAB — GLUCOSE, CAPILLARY
GLUCOSE-CAPILLARY: 125 mg/dL — AB (ref 65–99)
Glucose-Capillary: 107 mg/dL — ABNORMAL HIGH (ref 65–99)
Glucose-Capillary: 122 mg/dL — ABNORMAL HIGH (ref 65–99)
Glucose-Capillary: 126 mg/dL — ABNORMAL HIGH (ref 65–99)
Glucose-Capillary: 126 mg/dL — ABNORMAL HIGH (ref 65–99)
Glucose-Capillary: 128 mg/dL — ABNORMAL HIGH (ref 65–99)
Glucose-Capillary: 93 mg/dL (ref 65–99)

## 2017-06-24 NOTE — Progress Notes (Addendum)
ADDENDUM: Patient reports tolerating clear liquids diet with ongoing +flatus and multiple BM's, has been ambulating around the halls/RN's station, and denies any further abdominal pain, N/V. Abdomen is soft, NT, and ND.   - will advance to soft diet  - anticipate discharge planning, possibly tomorrow       SURGICAL PROGRESS NOTE (cpt [phone])  Hospital Day(s): 2.   Post op day(s):  Marland Kitchen   Interval History: Patient seen and examined, no acute events or new complaints overnight. Patient reports she's passing flatus with complete resolution of any abdominal pain or distention, denies N/V, fever/chills, CP, or SOB and has been ambulating.  Review of Systems:  Constitutional: denies fever, chills  HEENT: denies cough or congestion  Respiratory: denies any shortness of breath  Cardiovascular: denies chest pain or palpitations  Gastrointestinal: abdominal pain, N/V, and bowel function as per interval history Genitourinary: denies burning with urination or urinary frequency Musculoskeletal: denies pain, decreased motor or sensation Integumentary: denies any other rashes or skin discolorations Neurological: denies HA or vision/hearing changes   Vital signs in last 24 hours: [min-max] current  Temp:  [98.3 F (36.8 C)-98.8 F (37.1 C)] 98.3 F (36.8 C) (03/17 0455) Pulse Rate:  [70-82] 70 (03/17 0455) Resp:  [16-18] 16 (03/17 0455) BP: (136-143)/(63-73) 142/67 (03/17 0455) SpO2:  [99 %-100 %] 99 % (03/17 0455)     Height: 5\' 5"  (165.1 cm) Weight: 175 lb (79.4 kg) BMI (Calculated): 29.12   Intake/Output this shift:  Total I/O In: -  Out: 600 [Urine:600]   Intake/Output last 2 shifts:  @IOLAST2SHIFTS @   Physical Exam:  Constitutional: alert, cooperative and no distress  HENT: normocephalic without obvious abnormality  Eyes: PERRL, EOM's grossly intact and symmetric  Neuro: CN II - XII grossly intact and symmetric without deficit  Respiratory: breathing non-labored at rest   Cardiovascular: regular rate and sinus rhythm  Gastrointestinal: soft, completely non-tender, and non-distended Musculoskeletal: UE and LE FROM, no edema or wounds, motor and sensation grossly intact, NT   Labs:  CBC Latest Ref Rng & Units 06/22/2017 06/21/2017 06/20/2017  WBC 3.6 - 11.0 K/uL 10.4 16.2(H) 9.1  Hemoglobin 12.0 - 16.0 g/dL 11.2(L) 12.6 11.5(L)  Hematocrit 35.0 - 47.0 % 34.9(L) 40.7 36.5  Platelets 150 - 440 K/uL 311 359 302   CMP Latest Ref Rng & Units 06/24/2017 06/22/2017 06/21/2017  Glucose 65 - 99 mg/dL 604(V) 409(W) 119(J)  BUN 6 - 20 mg/dL 8 17 13   Creatinine 0.44 - 1.00 mg/dL 4.78 2.95(A) 2.13(Y)  Sodium 135 - 145 mmol/L 143 141 138  Potassium 3.5 - 5.1 mmol/L 3.4(L) 4.0 4.0  Chloride 101 - 111 mmol/L 107 105 99(L)  CO2 22 - 32 mmol/L 30 27 25   Calcium 8.9 - 10.3 mg/dL 8.6(V) 7.8(I) 9.8  Total Protein 6.5 - 8.1 g/dL - - 7.5  Total Bilirubin 0.3 - 1.2 mg/dL - - 0.7  Alkaline Phos 38 - 126 U/L - - 80  AST 15 - 41 U/L - - 20  ALT 14 - 54 U/L - - 19   Imaging studies: No new pertinent imaging studies   Assessment/Plan: (ICD-10's: K56.52) 57 y.o.femalewithrecurrentcomplete small bowel obstruction, likely attributable to post-surgical adhesions followinghysterectomy in particular, complicated by pertinent comorbidities includingDM, HTN, hypercholesterolemia, OSA, gastroparesis, IBD, and major depression disorder.  - remove NG tube, start clear liquids diet - monitor ongoing bowel function and abdominal exam  - medical management comorbidities - ambulation encouraged - DVT prophylaxis  All of the above findings and  recommendations were discussed with the patient and her RN, and all of patient's questions were answered to her expressed satisfaction.  -- Scherrie GerlachJason E. Earlene Plateravis, MD, RPVI [place]: [facility] - Partnering for exceptional care. Office: [phone]

## 2017-06-25 LAB — GLUCOSE, CAPILLARY
GLUCOSE-CAPILLARY: 123 mg/dL — AB (ref 65–99)
Glucose-Capillary: 115 mg/dL — ABNORMAL HIGH (ref 65–99)

## 2017-06-25 NOTE — Discharge Summary (Signed)
Patient ID: [name] MRN: [id number] DOB/AGE: 1960/11/30 57 y.o.  Admit date: 06/21/2017 Discharge date: 06/25/2017  Discharge Diagnoses:  Bowel obstruction  Procedures Performed: None  Discharged Condition: good  Hospital Course: Patient admitted with a bowel obstruction that resolved without surgical intervention. On the day of discharge her abdomen was soft and nontender. She was tolerating a diet and having bowel function.  Discharge Orders: Discharge Instructions    Call MD for:  persistant nausea and vomiting   Complete by:  As directed    Call MD for:  severe uncontrolled pain   Complete by:  As directed    Call MD for:  temperature >100.4   Complete by:  As directed    Diet - low sodium heart healthy   Complete by:  As directed    Increase activity slowly   Complete by:  As directed       Disposition: Discharge disposition: 01-Home or Self Care       Discharge Medications: Allergies as of 06/25/2017      Reactions   Amoxicillin Anaphylaxis   Strawberry Extract Shortness Of Breath, Swelling   Ibuprofen Other (See Comments)   Acute renal failure   Tetracyclines & Related Rash      Medication List    TAKE these medications   buPROPion 300 MG 24 hr tablet Commonly known as:  WELLBUTRIN XL Take 300 mg by mouth daily.   clonazePAM 0.5 MG tablet Commonly known as:  KLONOPIN Take 0.5 mg by mouth at bedtime as needed for anxiety.   dicyclomine 20 MG tablet Commonly known as:  BENTYL Take 1 tablet (20 mg total) by mouth every 6 (six) hours as needed.   ferrous sulfate 325 (65 FE) MG tablet Take 325 mg by mouth daily with breakfast.   hyoscyamine 0.125 MG SL tablet Commonly known as:  LEVSIN SL Place 1 tablet (0.125 mg total) under the tongue every 4 (four) hours as needed for cramping.   irbesartan-hydrochlorothiazide 150-12.5 MG tablet Commonly known as:  AVALIDE Take 1 tablet by mouth daily.   lactulose 10 GM/15ML solution Commonly known  as:  CHRONULAC Take 30 mLs (20 g total) by mouth daily as needed for mild constipation.   losartan 50 MG tablet Commonly known as:  COZAAR Take 50 mg by mouth daily.   metFORMIN 500 MG tablet Commonly known as:  GLUCOPHAGE Take 500 mg by mouth 2 (two) times daily with a meal.   metoCLOPramide 10 MG tablet Commonly known as:  REGLAN Take 1 tablet (10 mg total) by mouth every 6 (six) hours as needed for nausea or vomiting.   ondansetron 4 MG disintegrating tablet Commonly known as:  ZOFRAN ODT Take 1 tablet (4 mg total) by mouth every 8 (eight) hours as needed for nausea or vomiting.   sertraline 50 MG tablet Commonly known as:  ZOLOFT Take 50 mg by mouth daily.   simvastatin 40 MG tablet Commonly known as:  ZOCOR Take 40 mg by mouth every evening.   zolpidem 5 MG tablet Commonly known as:  AMBIEN Take 5 mg by mouth at bedtime as needed for sleep.        Follwup: Follow-up Information    Ancil Linsey, MD. Schedule an appointment as soon as possible for a visit in 2 week(s).   Specialty:  General Surgery Contact information: [address] [place] [address] [phone]           Signed: Ricarda Frame 06/25/2017,  8:29 AM

## 2017-06-25 NOTE — Progress Notes (Signed)
[name]  A and O x 4. VSS. Pt tolerating diet well. No complaints of pain or nausea. IV removed intact,no new prescriptions given. Pt voiced understanding of discharge instructions with no further questions. Pt discharged via wheelchair with axillary.  Orvil FeilAbbie Jady Braggs MSN, RN-BC  Allergies as of 06/25/2017      Reactions   Amoxicillin Anaphylaxis   Strawberry Extract Shortness Of Breath, Swelling   Ibuprofen Other (See Comments)   Acute renal failure   Tetracyclines & Related Rash      Medication List    TAKE these medications   buPROPion 300 MG 24 hr tablet Commonly known as:  WELLBUTRIN XL Take 300 mg by mouth daily.   clonazePAM 0.5 MG tablet Commonly known as:  KLONOPIN Take 0.5 mg by mouth at bedtime as needed for anxiety.   dicyclomine 20 MG tablet Commonly known as:  BENTYL Take 1 tablet (20 mg total) by mouth every 6 (six) hours as needed.   ferrous sulfate 325 (65 FE) MG tablet Take 325 mg by mouth daily with breakfast.   hyoscyamine 0.125 MG SL tablet Commonly known as:  LEVSIN SL Place 1 tablet (0.125 mg total) under the tongue every 4 (four) hours as needed for cramping.   irbesartan-hydrochlorothiazide 150-12.5 MG tablet Commonly known as:  AVALIDE Take 1 tablet by mouth daily.   lactulose 10 GM/15ML solution Commonly known as:  CHRONULAC Take 30 mLs (20 g total) by mouth daily as needed for mild constipation.   losartan 50 MG tablet Commonly known as:  COZAAR Take 50 mg by mouth daily.   metFORMIN 500 MG tablet Commonly known as:  GLUCOPHAGE Take 500 mg by mouth 2 (two) times daily with a meal.   metoCLOPramide 10 MG tablet Commonly known as:  REGLAN Take 1 tablet (10 mg total) by mouth every 6 (six) hours as needed for nausea or vomiting.   ondansetron 4 MG disintegrating tablet Commonly known as:  ZOFRAN ODT Take 1 tablet (4 mg total) by mouth every 8 (eight) hours as needed for nausea or vomiting.   sertraline 50 MG tablet Commonly known  as:  ZOLOFT Take 50 mg by mouth daily.   simvastatin 40 MG tablet Commonly known as:  ZOCOR Take 40 mg by mouth every evening.   zolpidem 5 MG tablet Commonly known as:  AMBIEN Take 5 mg by mouth at bedtime as needed for sleep.       Vitals:   06/24/17 2031 06/25/17 0539  BP: (!) 152/75 140/76  Pulse: 71 71  Resp: 18 18  Temp: 98.3 F (36.8 C) 98.8 F (37.1 C)  SpO2: 100% 100%

## 2017-06-28 ENCOUNTER — Other Ambulatory Visit: Payer: Self-pay | Admitting: Internal Medicine

## 2017-06-28 ENCOUNTER — Telehealth: Payer: Self-pay | Admitting: Surgery

## 2017-06-28 ENCOUNTER — Ambulatory Visit
Admission: RE | Admit: 2017-06-28 | Discharge: 2017-06-28 | Disposition: A | Discharge status: Home / Self Care | Payer: Federal, State, Local not specified - PPO | Source: Ambulatory Visit | Attending: Internal Medicine | Admitting: Internal Medicine

## 2017-06-28 DIAGNOSIS — K56600 Partial intestinal obstruction, unspecified as to cause: Secondary | ICD-10-CM

## 2017-06-28 NOTE — Telephone Encounter (Signed)
When calling patient to collect disability paperwork, patient asked if one of the nurses would give her a call in regards to a doctors note for work. Disability paperwork fee has been paid and placed in folder up front. Patient can be reached at [phone]. Please call and advise.

## 2017-06-28 NOTE — Telephone Encounter (Signed)
Spoke with patient and she states she is having multiple bowel movements daily. She is having loose stools. She has had a few accidents due to the diarrhea.  I spoke with Dr.Davis and relayed the information to the patient.  Continue to drink plenty of fluids. The diarrhea should resolve. Do not take any anti diarrhea medications.   Patient will return to work 07/02/17 instead of 06/29/17 per Dr.Davis.

## 2017-06-28 NOTE — Telephone Encounter (Signed)
Disability paperwork completed and faxed to [employer] [phone] at this time . Patient notified. Placed in scan folder.

## 2017-07-05 ENCOUNTER — Encounter: Payer: Self-pay | Admitting: Surgery

## 2017-07-05 ENCOUNTER — Ambulatory Visit (INDEPENDENT_AMBULATORY_CARE_PROVIDER_SITE_OTHER): Payer: Federal, State, Local not specified - PPO | Admitting: Surgery

## 2017-07-05 VITALS — BP 137/80 | HR 87 | Temp 98.2°F | Ht 65.0 in | Wt 173.6 lb

## 2017-07-05 DIAGNOSIS — K56609 Unspecified intestinal obstruction, unspecified as to partial versus complete obstruction: Secondary | ICD-10-CM

## 2017-07-05 NOTE — Progress Notes (Signed)
Surgical Consultation  07/05/2017  [name] is an 57 y.o. female.   Chief Complaint  Patient presents with  . Follow-up    small bowel Obstruction     HPI: 57 year old female with multiple gynecological procedures in the past including hysterectomy, C-section and ectopic pregnancy.  All of them have been done Via Pfannenstiel incision.  Has had a history of recurrent bowel obstructions total of 3 most recently hospitalized and managed medically.  I personally reviewed her CT from 2 weeks ago showing evidence of partial small bowel obstruction transition point in the midline anterior abdominal wall. She feels that she still has intermittent pain and bloating.  She is passing gas and she is taking p.o.  Some nausea but no vomiting. Pain is intermittent, colicky in moderate intensity.  Past Medical History:  Diagnosis Date  . Borderline diabetic   . Bowel obstruction (HCC)   . Depression   . Diabetes mellitus without complication (HCC)   . Ectopic pregnancy   . Hypercholesteremia   . Hypertension   . Sleep disorder     Past Surgical History:  Procedure Laterality Date  . ABDOMINAL HYSTERECTOMY  2003  . CESAREAN SECTION  2003  . COLONOSCOPY    . ECTOPIC PREGNANCY SURGERY     tubal removel  . ESOPHAGOGASTRODUODENOSCOPY    . EXPLORATORY LAPAROTOMY    . TONSILLECTOMY    . UTERINE FIBROID SURGERY      Family History  Problem Relation Age of Onset  . Diabetes Mother   . Colon cancer Neg Hx     Social History:  reports that she has never smoked. She has never used smokeless tobacco. She reports that she does not drink alcohol or use drugs.  Allergies:  Allergies  Allergen Reactions  . Amoxicillin Anaphylaxis  . Strawberry Extract Shortness Of Breath and Swelling  . Ibuprofen Other (See Comments)    Acute renal failure  . Tetracyclines & Related Rash    Medications reviewed.     ROS Full ROS performed and is otherwise negative other than what is stated in  the HPI    BP 137/80   Pulse 87   Temp 98.2 F (36.8 C) (Oral)   Ht 5\' 5"  (1.651 m)   Wt 78.7 kg (173 lb 9.6 oz)   BMI 28.89 kg/m   Physical Exam  Constitutional: She is oriented to person, place, and time and well-developed, well-nourished, and in no distress. No distress.  Eyes: Right eye exhibits no discharge. Left eye exhibits no discharge. No scleral icterus.  Neck: Normal range of motion. No JVD present. No tracheal deviation present. No thyromegaly present.  Cardiovascular: Normal rate and normal heart sounds.  Pulmonary/Chest: Effort normal and breath sounds normal. No stridor. No respiratory distress. She has no wheezes. She has no rales.  Abdominal: Soft. She exhibits distension. She exhibits no mass. There is no tenderness. There is no rebound and no guarding.  C section scar no peritonitis  Musculoskeletal: Normal range of motion.  Neurological: She is alert and oriented to person, place, and time. Gait normal. GCS score is 15.  Skin: Skin is warm and dry. She is not diaphoretic.  Psychiatric: Mood, memory and affect normal.  Nursing note and vitals reviewed.      Assessment/Plan: Recurrent bowel obstructions with early possible resolution.  I do think that she has intermittent partial small bowel obstruction and given that she has persistent symptoms and history of 3 recurrences she is better served with operative  intervention.  We had a lengthy discussion about choices of laparotomy versus diagnostic laparoscopy with lysis of adhesions.  I do think that while she is not obstructed we can attempt to do a laparoscopic lysis of adhesions.  She understands that there is a very good chance of conversion to open.   Procedure was discussed with the patient in detail.  Risk benefits and possible complications including but not limited to recurrence, infection, bowel injury, prolonged pain hospitalization.  She agrees and wishes to proceed. I spent over 40 minutes in this  encounter with the majority of time spent in coordination and counseling of her care Sterling Big, MD [facility] General Surgeon

## 2017-07-05 NOTE — Patient Instructions (Addendum)
Please see your blue pre-care sheet for surgery information. We have scheduled your surgery for 07/18/17 @ [facility] with Dr.Diego Pabon.

## 2017-07-09 ENCOUNTER — Telehealth: Payer: Self-pay | Admitting: Surgery

## 2017-07-09 NOTE — Telephone Encounter (Signed)
Patient is calling patient is asking if she can postpone her surgery date to sometime in May. Please call patient and advise.

## 2017-07-09 NOTE — Telephone Encounter (Signed)
I have contacted patient and advised patient that I will let Dr Everlene FarrierPabon know that patient would like surgery to be scheduled the week of May 13th. I informed the patient that I will contact her with a confirmed surgery date and pre op time and date by the end of the business day tomorrow. Patient is ok with this.

## 2017-07-12 NOTE — Telephone Encounter (Signed)
Patient called and left a message and said she needs to know an exact  date on when she will have surgery please call patient and advise.

## 2017-07-16 NOTE — Telephone Encounter (Signed)
Patient is calling again to schedule her surgery.

## 2017-07-17 NOTE — Telephone Encounter (Signed)
I have called patient on home and cell number. No answer. I have left a message on both voicemail's.   pre op date/time and sx date. Sx: 08/23/17 With Dr Julian ReilPabon-Lysis of adhesion.  Pre op: 08/14/17 @ 8:15am--office interview.   Patient made aware to call [phone], between 1-3:00pm the day before surgery, to find out what time to arrive.

## 2017-07-17 NOTE — Telephone Encounter (Signed)
I have spoken with the patient to inform her of all information. Patient understands all information.

## 2017-07-18 ENCOUNTER — Telehealth: Payer: Self-pay | Admitting: Surgery

## 2017-07-18 NOTE — Telephone Encounter (Signed)
I have called patient to advise her that she will need a follow up appointment prior to her scheduled surgery with Dr Everlene FarrierPabon on 08/23/17.   An appointment can be made when Dr Everlene FarrierPabon is back in clinic between 4/29-5/2.

## 2017-07-25 ENCOUNTER — Ambulatory Visit: Payer: Federal, State, Local not specified - PPO | Admitting: Neurology

## 2017-07-25 ENCOUNTER — Encounter: Payer: Self-pay | Admitting: Neurology

## 2017-07-25 ENCOUNTER — Other Ambulatory Visit: Payer: Self-pay

## 2017-07-25 DIAGNOSIS — R413 Other amnesia: Secondary | ICD-10-CM

## 2017-07-25 DIAGNOSIS — G3184 Mild cognitive impairment, so stated: Secondary | ICD-10-CM | POA: Diagnosis not present

## 2017-07-25 HISTORY — DX: Mild cognitive impairment of uncertain or unknown etiology: G31.84

## 2017-07-25 NOTE — Progress Notes (Signed)
Reason for visit: Memory disturbance  Referring physician: Dr. Polite  [name] is a [id number] y.o. female  History of present illness:  Ms. [name] is a 57 year old right-handed black female with a history of borderline diabetes and depression.  She believes that her depression currently is under good control, she has been on Wellbutrin for quite a number of years.  The patient over the last year has noted some problems with word finding, she also has noted some difficulty with remembering names for people.  She has also developed a generalized short-term memory issue over the last 6 months.  The patient reports that she is still working, the memory issues have not prevented her from completing all of her activities of daily living.  She denies issues with keeping up with medications or appointments, she is able to operate a motor vehicle safely and she has no problems with directions.  She denies any issues doing the finances or cooking.  The patient sleeps well at night, she has a good energy level during the day.  She had been on a statin drug, she was taken off of this for 2 weeks but noted no difference in her memory.  She is now on Crestor.  The patient denies any numbness or weakness of the extremities, she denies any balance issues, she has had some recent problems with intestinal obstruction, but she denies any issues controlling the bladder.  She reports that her father and the paternal grandmother had dementia.  She is sent to this office for an evaluation.  Past Medical History:  Diagnosis Date  . Borderline diabetic   . Bowel obstruction (HCC)   . Depression   . Diabetes mellitus without complication (HCC)   . Ectopic pregnancy   . Hypercholesteremia   . Hypertension   . Sleep disorder     Past Surgical History:  Procedure Laterality Date  . ABDOMINAL HYSTERECTOMY  2003  . CESAREAN SECTION  2003  . COLONOSCOPY    . ECTOPIC PREGNANCY SURGERY     tubal removel  .  ESOPHAGOGASTRODUODENOSCOPY    . EXPLORATORY LAPAROTOMY    . TONSILLECTOMY    . UTERINE FIBROID SURGERY      Family History  Problem Relation Age of Onset  . Diabetes Mother   . Dementia Father   . Dementia Paternal Grandmother   . Pulmonary embolism Brother   . Colon cancer Neg Hx     Social history:  reports that she has never smoked. She has never used smokeless tobacco. She reports that she does not drink alcohol or use drugs.  Medications:  Prior to Admission medications   Medication Sig Start Date End Date Taking? Authorizing Provider  buPROPion (WELLBUTRIN XL) 300 MG 24 hr tablet Take 300 mg by mouth daily.   Yes [provider]  clonazePAM (KLONOPIN) 0.5 MG tablet Take 0.5 mg by mouth at bedtime as needed for anxiety.    Yes [provider]  hyoscyamine (LEVSIN SL) 0.125 MG SL tablet Place 1 tablet (0.125 mg total) under the tongue every 4 (four) hours as needed for cramping. 10/22/12  Yes Mardella LaymanPatterson, David R, MD  irbesartan-hydrochlorothiazide (AVALIDE) 150-12.5 MG tablet Take 1 tablet by mouth daily.   Yes [provider]  metFORMIN (GLUCOPHAGE) 500 MG tablet Take 500 mg by mouth 2 (two) times daily with a meal.  05/11/13  Yes [provider]  rosuvastatin (CRESTOR) 10 MG tablet TK 1 T PO ONCE A DAY. 06/08/17  Yes [provider]      Allergies  Allergen Reactions  . Amoxicillin Anaphylaxis  . Strawberry Extract Shortness Of Breath and Swelling  . Ibuprofen Other (See Comments)    Acute renal failure  . Tetracyclines & Related Rash    ROS:  Out of a complete 14 system review of symptoms, the patient complains only of the following symptoms, and all other reviewed systems are negative.  Memory loss  Blood pressure 124/68, pulse 85, height 5\' 5"  (1.651 m), weight 171 lb 8 oz (77.8 kg).  Physical Exam  General: The patient is alert and cooperative at the time of the examination.  Eyes: Pupils are equal, round, and reactive  to light. Discs are flat bilaterally.  Neck: The neck is supple, no carotid bruits are noted.  Respiratory: The respiratory examination is clear.  Cardiovascular: The cardiovascular examination reveals a regular rate and rhythm, no obvious murmurs or rubs are noted.  Skin: Extremities are without significant edema.  Neurologic Exam  Mental status: The patient is alert and oriented x 3 at the time of the examination. The patient has apparent normal recent and remote memory, with an apparently normal attention span and concentration ability.  Mini-Mental status examination done today shows a total score 29/30.  Cranial nerves: Facial symmetry is present. There is good sensation of the face to pinprick and soft touch bilaterally. The strength of the facial muscles and the muscles to head turning and shoulder shrug are normal bilaterally. Speech is well enunciated, no aphasia or dysarthria is noted. Extraocular movements are full. Visual fields are full. The tongue is midline, and the patient has symmetric elevation of the soft palate. No obvious hearing deficits are noted.  Motor: The motor testing reveals 5 over 5 strength of all 4 extremities. Good symmetric motor tone is noted throughout.  Sensory: Sensory testing is intact to pinprick, soft touch, vibration sensation, and position sense on all 4 extremities. No evidence of extinction is noted.  Coordination: Cerebellar testing reveals good finger-nose-finger and heel-to-shin bilaterally.  Gait and station: Gait is normal. Tandem gait is normal. Romberg is negative. No drift is seen.  Reflexes: Deep tendon reflexes are symmetric and normal bilaterally. Toes are downgoing bilaterally.   Assessment/Plan:  1.  Progressive memory disturbance  2.  History of depression  The patient does not believe that she has significant issues with depression at this time.  She is sleeping well at night.  She will be sent for further blood work today,  she will have MRI evaluation of the brain.  We will consider addition of a medication such as Aricept for memory in the future.  She will follow-up in 6 months. She may wish to give a trial off of Crestor for at least 2 months to see if the cognitive functioning improves.  Marlan Palau MD 07/25/2017 1:46 PM  [facility] [address] [place], [place] [address]  Phone [phone] Fax [phone]

## 2017-07-26 ENCOUNTER — Telehealth: Payer: Self-pay | Admitting: Neurology

## 2017-07-26 LAB — HIV ANTIBODY (ROUTINE TESTING W REFLEX): HIV Screen 4th Generation wRfx: NONREACTIVE

## 2017-07-26 LAB — SEDIMENTATION RATE: Sed Rate: 17 mm/hr (ref 0–40)

## 2017-07-26 LAB — RPR: RPR: NONREACTIVE

## 2017-07-26 LAB — VITAMIN B12: Vitamin B-12: 414 pg/mL (ref 232–1245)

## 2017-07-26 NOTE — Telephone Encounter (Signed)
Patient returned my call she is scheduled for 07/31/17 on the [facility] mobile unit.

## 2017-07-26 NOTE — Telephone Encounter (Signed)
[employer] Auth: [id number]  lvm for pt to call back about scheduling mri

## 2017-07-31 ENCOUNTER — Ambulatory Visit: Payer: Federal, State, Local not specified - PPO

## 2017-07-31 DIAGNOSIS — R413 Other amnesia: Secondary | ICD-10-CM | POA: Diagnosis not present

## 2017-08-01 ENCOUNTER — Telehealth: Payer: Self-pay | Admitting: Neurology

## 2017-08-01 NOTE — Telephone Encounter (Signed)
  I called the patient.  MRI of the brain was completely normal.  No evidence of atrophy or small vessel disease that would correlate with a memory disorder.  The patient will be given a trial off of Crestor for 2 months, if she still believes that the memory is progressing, we may consider a medication such as Aricept in the future.   MRI brain 08/01/17:  IMPRESSION: Unremarkable MRI scan of the brain without contrast.

## 2017-08-07 ENCOUNTER — Telehealth: Payer: Self-pay | Admitting: General Practice

## 2017-08-07 NOTE — Telephone Encounter (Signed)
We have received patients fmla paperwork through fax, I have left a message for the patient to call the office to pay the fee for paperwork to be filled out.

## 2017-08-09 ENCOUNTER — Encounter: Payer: Self-pay | Admitting: Surgery

## 2017-08-09 ENCOUNTER — Ambulatory Visit (INDEPENDENT_AMBULATORY_CARE_PROVIDER_SITE_OTHER): Payer: Federal, State, Local not specified - PPO | Admitting: Surgery

## 2017-08-09 ENCOUNTER — Encounter
Admission: RE | Admit: 2017-08-09 | Discharge: 2017-08-09 | Disposition: A | Discharge status: Home / Self Care | Payer: Federal, State, Local not specified - PPO | Source: Ambulatory Visit | Attending: Surgery | Admitting: Surgery

## 2017-08-09 ENCOUNTER — Ambulatory Visit: Payer: Federal, State, Local not specified - PPO | Admitting: Surgery

## 2017-08-09 ENCOUNTER — Other Ambulatory Visit: Payer: Self-pay

## 2017-08-09 VITALS — BP 125/77 | HR 86 | Temp 97.8°F | Ht 65.0 in | Wt 171.0 lb

## 2017-08-09 DIAGNOSIS — Z1231 Encounter for screening mammogram for malignant neoplasm of breast: Secondary | ICD-10-CM | POA: Diagnosis not present

## 2017-08-09 DIAGNOSIS — Z0000 Encounter for general adult medical examination without abnormal findings: Secondary | ICD-10-CM

## 2017-08-09 DIAGNOSIS — Z01818 Encounter for other preprocedural examination: Secondary | ICD-10-CM | POA: Insufficient documentation

## 2017-08-09 DIAGNOSIS — Z6831 Body mass index (BMI) 31.0-31.9, adult: Secondary | ICD-10-CM | POA: Diagnosis not present

## 2017-08-09 DIAGNOSIS — Z01419 Encounter for gynecological examination (general) (routine) without abnormal findings: Secondary | ICD-10-CM | POA: Diagnosis not present

## 2017-08-09 DIAGNOSIS — Z09 Encounter for follow-up examination after completed treatment for conditions other than malignant neoplasm: Secondary | ICD-10-CM

## 2017-08-09 HISTORY — DX: Adverse effect of unspecified anesthetic, initial encounter: T41.45XA

## 2017-08-09 HISTORY — DX: Other complications of anesthesia, initial encounter: T88.59XA

## 2017-08-09 NOTE — Patient Instructions (Signed)
Please look at your blue sheet in case you have any questions about your surgery. 

## 2017-08-09 NOTE — Telephone Encounter (Signed)
Patient is here for an appointment today and paid her fmla paperwork form fee. Paperwork has been placed in folder up front.

## 2017-08-09 NOTE — Patient Instructions (Signed)
Your procedure is scheduled on: Tuesday, Aug 21, 2017 Report to Day Surgery on the 2nd floor of the [facility] To find out your arrival time, please call [phone] between 1PM - 3PM on: Monday, Aug 20, 2017  REMEMBER: Instructions that are not followed completely may result in serious medical risk, up to and including death; or upon the discretion of your surgeon and anesthesiologist your surgery may need to be rescheduled.  Do not eat food after midnight the night before your procedure.  No gum chewing, lozengers or hard candies.  You may however, drink water up to 2 hours before you are scheduled to arrive for your surgery. Do not drink anything within 2 hours of the start of your surgery.  No Alcohol for 24 hours before or after surgery.  No Smoking including e-cigarettes for 24 hours prior to surgery.  No chewable tobacco products for at least 6 hours prior to surgery.  No nicotine patches on the day of surgery.  On the morning of surgery brush your teeth with toothpaste and water, you may rinse your mouth with mouthwash if you wish. Do not swallow any toothpaste or mouthwash.  Notify your doctor if there is any change in your medical condition (cold, fever, infection).  Do not wear jewelry, make-up, hairpins, clips or nail polish.  Do not wear lotions, powders, or perfumes. You may wear deodorant.  Do not shave 48 hours prior to surgery.   Contacts and dentures may not be worn into surgery.  Do not bring valuables to the hospital, including drivers license, insurance or credit cards.  [facility] is not responsible for any belongings or valuables.   TAKE THESE MEDICATIONS THE MORNING OF SURGERY: NONE  Use CHG Soap as directed on instruction sheet.  Stop Metformin 2 days prior to surgery. Last day to take is Saturday, Aug 18, 2017. Resume after surgery.  May 7 - Stop aspirin and Anti-inflammatories (NSAIDS) such as Advil, Aleve, Ibuprofen, Motrin, Naproxen,  Naprosyn and Aspirin based products such as Excedrin, Goodys Powder, BC Powder. (May take Tylenol or Acetaminophen if needed.)  May 7 - Stop ANY OVER THE COUNTER supplements until after surgery.  Wear comfortable clothing (specific to your surgery type) to the hospital.  Plan for stool softeners for home use.  If you are being admitted to the hospital overnight, leave your suitcase in the car. After surgery it may be brought to your room.  If you are being discharged the day of surgery, you will not be allowed to drive home. You will need a responsible adult to drive you home and stay with you that night.   If you are taking public transportation, you will need to have a responsible adult with you. Please confirm with your physician that it is acceptable to use public transportation.   Please call [phone] if you have any questions about these instructions.

## 2017-08-09 NOTE — H&P (View-Only) (Signed)
Outpatient Surgical Follow Up  08/09/2017  [name] is an 57 y.o. female.   Chief Complaint  Patient presents with  . Follow-up    Discuss surgery date    HPI: 57 year old female with multiple gynecological procedures in the past including hysterectomy, C-section and ectopic pregnancy.  All of them have been done Via Pfannenstiel incision.  Has had a history of recurrent bowel obstructions total of 3 most recently hospitalized and managed medically. She is doing well and is here to discussed surgical options.   Past Medical History:  Diagnosis Date  . Borderline diabetic   . Bowel obstruction (HCC)   . Depression   . Diabetes mellitus without complication (HCC)   . Ectopic pregnancy   . Hypercholesteremia   . Hypertension   . Mild cognitive impairment 07/25/2017  . Sleep disorder     Past Surgical History:  Procedure Laterality Date  . ABDOMINAL HYSTERECTOMY  2003  . CESAREAN SECTION  2003  . COLONOSCOPY    . ECTOPIC PREGNANCY SURGERY     tubal removel  . ESOPHAGOGASTRODUODENOSCOPY    . EXPLORATORY LAPAROTOMY    . TONSILLECTOMY    . UTERINE FIBROID SURGERY      Family History  Problem Relation Age of Onset  . Diabetes Mother   . Dementia Father   . Dementia Paternal Grandmother   . Pulmonary embolism Brother   . Colon cancer Neg Hx     Social History:  reports that she has never smoked. She has never used smokeless tobacco. She reports that she does not drink alcohol or use drugs.  Allergies:  Allergies  Allergen Reactions  . Amoxicillin Anaphylaxis    Has patient had a PCN reaction causing immediate rash, facial/tongue/throat swelling, SOB or lightheadedness with hypotension: Yes Has patient had a PCN reaction causing severe rash involving mucus membranes or skin necrosis: No Has patient had a PCN reaction that required hospitalization: Yes Has patient had a PCN reaction occurring within the last 10 years: No If all of the above answers are "NO", then may  proceed with Cephalosporin use.   . Strawberry Extract Shortness Of Breath, Itching and Swelling  . Ibuprofen Other (See Comments)    Acute renal failure  . Tetracyclines & Related Nausea And Vomiting and Rash    Medications reviewed.    ROS Full ROS performed and is otherwise negative other than what is stated in HPI   BP 125/77   Pulse 86   Temp 97.8 F (36.6 C) (Oral)   Ht  (1.651 m)   Wt 77.6 kg (171 lb)   BMI 28.46 kg/m   Physical Exam  Constitutional: She is oriented to person, place, and time. She appears well-developed and well-nourished. No distress.  Neck: Normal range of motion. No JVD present. No tracheal deviation present. No thyromegaly present.  Cardiovascular: Normal rate and regular rhythm.  Pulmonary/Chest: Effort normal and breath sounds normal. No respiratory distress.  Abdominal: Soft. She exhibits no distension and no mass. There is no tenderness. There is no rebound and no guarding.  pfannenstiel scar  Musculoskeletal: Normal range of motion.  Neurological: She is alert and oriented to person, place, and time.  Skin: Skin is warm and dry. Capillary refill takes less than 2 seconds.  Psychiatric: She has a normal mood and affect. Her behavior is normal. Judgment and thought content normal.  Nursing note and vitals reviewed.   Assessment/Plan: Recurrent SBO .  Discussed with the patient detail about options  and I do recommend laparoscopic lysis of adhesions possible open given the recurrent symptoms. Schedule for surgery 2 weeks from now.  Risks benefits and possible complications including but not limited to: Bleeding, infection bowel injury conversion to open were explained to the patient detail.  She understands that she wishes to proceed  Sterling Big, MD [facility] General Surgeon

## 2017-08-09 NOTE — Progress Notes (Signed)
Outpatient Surgical Follow Up  08/09/2017  [name] is an 56 y.o. female.   Chief Complaint  Patient presents with  . Follow-up    Discuss surgery date    HPI: 56-year-old female with multiple gynecological procedures in the past including hysterectomy, C-section and ectopic pregnancy.  All of them have been done Via Pfannenstiel incision.  Has had a history of recurrent bowel obstructions total of 3 most recently hospitalized and managed medically. She is doing well and is here to discussed surgical options.   Past Medical History:  Diagnosis Date  . Borderline diabetic   . Bowel obstruction (HCC)   . Depression   . Diabetes mellitus without complication (HCC)   . Ectopic pregnancy   . Hypercholesteremia   . Hypertension   . Mild cognitive impairment 07/25/2017  . Sleep disorder     Past Surgical History:  Procedure Laterality Date  . ABDOMINAL HYSTERECTOMY  2003  . CESAREAN SECTION  2003  . COLONOSCOPY    . ECTOPIC PREGNANCY SURGERY     tubal removel  . ESOPHAGOGASTRODUODENOSCOPY    . EXPLORATORY LAPAROTOMY    . TONSILLECTOMY    . UTERINE FIBROID SURGERY      Family History  Problem Relation Age of Onset  . Diabetes Mother   . Dementia Father   . Dementia Paternal Grandmother   . Pulmonary embolism Brother   . Colon cancer Neg Hx     Social History:  reports that she has never smoked. She has never used smokeless tobacco. She reports that she does not drink alcohol or use drugs.  Allergies:  Allergies  Allergen Reactions  . Amoxicillin Anaphylaxis    Has patient had a PCN reaction causing immediate rash, facial/tongue/throat swelling, SOB or lightheadedness with hypotension: Yes Has patient had a PCN reaction causing severe rash involving mucus membranes or skin necrosis: No Has patient had a PCN reaction that required hospitalization: Yes Has patient had a PCN reaction occurring within the last 10 years: No If all of the above answers are "NO", then may  proceed with Cephalosporin use.   . Strawberry Extract Shortness Of Breath, Itching and Swelling  . Ibuprofen Other (See Comments)    Acute renal failure  . Tetracyclines & Related Nausea And Vomiting and Rash    Medications reviewed.    ROS Full ROS performed and is otherwise negative other than what is stated in HPI   BP 125/77   Pulse 86   Temp 97.8 F (36.6 C) (Oral)   Ht 5' 5" (1.651 m)   Wt 77.6 kg (171 lb)   BMI 28.46 kg/m   Physical Exam  Constitutional: She is oriented to person, place, and time. She appears well-developed and well-nourished. No distress.  Neck: Normal range of motion. No JVD present. No tracheal deviation present. No thyromegaly present.  Cardiovascular: Normal rate and regular rhythm.  Pulmonary/Chest: Effort normal and breath sounds normal. No respiratory distress.  Abdominal: Soft. She exhibits no distension and no mass. There is no tenderness. There is no rebound and no guarding.  pfannenstiel scar  Musculoskeletal: Normal range of motion.  Neurological: She is alert and oriented to person, place, and time.  Skin: Skin is warm and dry. Capillary refill takes less than 2 seconds.  Psychiatric: She has a normal mood and affect. Her behavior is normal. Judgment and thought content normal.  Nursing note and vitals reviewed.   Assessment/Plan: Recurrent SBO .  Discussed with the patient detail about options   and I do recommend laparoscopic lysis of adhesions possible open given the recurrent symptoms. Schedule for surgery 2 weeks from now.  Risks benefits and possible complications including but not limited to: Bleeding, infection bowel injury conversion to open were explained to the patient detail.  She understands that she wishes to proceed  Sterling Big, MD [facility] General Surgeon

## 2017-08-14 ENCOUNTER — Ambulatory Visit: Admission: RE | Admit: 2017-08-14 | Payer: Federal, State, Local not specified - PPO | Source: Ambulatory Visit

## 2017-08-16 ENCOUNTER — Telehealth: Payer: Self-pay | Admitting: Surgery

## 2017-08-16 NOTE — Telephone Encounter (Signed)
Patients fmla/disability paperwork has been faxed in.

## 2017-08-21 ENCOUNTER — Other Ambulatory Visit: Payer: Self-pay

## 2017-08-21 ENCOUNTER — Encounter
Admission: AD | Disposition: A | Discharge status: Home / Self Care | Payer: Self-pay | Source: Ambulatory Visit | Attending: Surgery

## 2017-08-21 ENCOUNTER — Ambulatory Visit: Payer: Federal, State, Local not specified - PPO | Admitting: Registered Nurse

## 2017-08-21 ENCOUNTER — Inpatient Hospital Stay
Admission: AD | Admit: 2017-08-21 | Discharge: 2017-08-23 | DRG: 331 | Disposition: A | Discharge status: Home / Self Care | Payer: Federal, State, Local not specified - PPO | Source: Ambulatory Visit | Attending: Surgery | Admitting: Surgery

## 2017-08-21 DIAGNOSIS — Z886 Allergy status to analgesic agent status: Secondary | ICD-10-CM | POA: Diagnosis not present

## 2017-08-21 DIAGNOSIS — E7800 Pure hypercholesterolemia, unspecified: Secondary | ICD-10-CM | POA: Diagnosis not present

## 2017-08-21 DIAGNOSIS — K5651 Intestinal adhesions [bands], with partial obstruction: Principal | ICD-10-CM

## 2017-08-21 DIAGNOSIS — Z90710 Acquired absence of both cervix and uterus: Secondary | ICD-10-CM

## 2017-08-21 DIAGNOSIS — G479 Sleep disorder, unspecified: Secondary | ICD-10-CM | POA: Diagnosis not present

## 2017-08-21 DIAGNOSIS — Z91018 Allergy to other foods: Secondary | ICD-10-CM | POA: Diagnosis not present

## 2017-08-21 DIAGNOSIS — G3184 Mild cognitive impairment, so stated: Secondary | ICD-10-CM | POA: Diagnosis present

## 2017-08-21 DIAGNOSIS — Z9049 Acquired absence of other specified parts of digestive tract: Secondary | ICD-10-CM

## 2017-08-21 DIAGNOSIS — F418 Other specified anxiety disorders: Secondary | ICD-10-CM | POA: Diagnosis present

## 2017-08-21 DIAGNOSIS — K219 Gastro-esophageal reflux disease without esophagitis: Secondary | ICD-10-CM | POA: Diagnosis not present

## 2017-08-21 DIAGNOSIS — E119 Type 2 diabetes mellitus without complications: Secondary | ICD-10-CM | POA: Diagnosis present

## 2017-08-21 DIAGNOSIS — N994 Postprocedural pelvic peritoneal adhesions: Secondary | ICD-10-CM | POA: Diagnosis not present

## 2017-08-21 DIAGNOSIS — Z833 Family history of diabetes mellitus: Secondary | ICD-10-CM | POA: Diagnosis not present

## 2017-08-21 DIAGNOSIS — Z881 Allergy status to other antibiotic agents status: Secondary | ICD-10-CM | POA: Diagnosis not present

## 2017-08-21 DIAGNOSIS — I10 Essential (primary) hypertension: Secondary | ICD-10-CM | POA: Diagnosis not present

## 2017-08-21 DIAGNOSIS — F329 Major depressive disorder, single episode, unspecified: Secondary | ICD-10-CM | POA: Diagnosis not present

## 2017-08-21 DIAGNOSIS — K660 Peritoneal adhesions (postprocedural) (postinfection): Secondary | ICD-10-CM | POA: Diagnosis not present

## 2017-08-21 DIAGNOSIS — Z5331 Laparoscopic surgical procedure converted to open procedure: Secondary | ICD-10-CM | POA: Diagnosis not present

## 2017-08-21 DIAGNOSIS — K56609 Unspecified intestinal obstruction, unspecified as to partial versus complete obstruction: Secondary | ICD-10-CM | POA: Diagnosis not present

## 2017-08-21 HISTORY — PX: BOWEL RESECTION: SHX1257

## 2017-08-21 HISTORY — PX: LAPAROSCOPIC LYSIS OF ADHESIONS: SHX5905

## 2017-08-21 HISTORY — DX: Acquired absence of other specified parts of digestive tract: Z90.49

## 2017-08-21 HISTORY — PX: APPENDECTOMY: SHX54

## 2017-08-21 LAB — CREATININE, SERUM: Creatinine, Ser: 1.02 mg/dL — ABNORMAL HIGH (ref 0.44–1.00)

## 2017-08-21 LAB — CBC
HCT: 32.6 % — ABNORMAL LOW (ref 35.0–47.0)
Hemoglobin: 10.4 g/dL — ABNORMAL LOW (ref 12.0–16.0)
MCH: 25.2 pg — ABNORMAL LOW (ref 26.0–34.0)
MCHC: 31.7 g/dL — ABNORMAL LOW (ref 32.0–36.0)
MCV: 79.4 fL — AB (ref 80.0–100.0)
PLATELETS: 303 10*3/uL (ref 150–440)
RBC: 4.11 MIL/uL (ref 3.80–5.20)
RDW: 14.7 % — AB (ref 11.5–14.5)
WBC: 14.4 10*3/uL — ABNORMAL HIGH (ref 3.6–11.0)

## 2017-08-21 LAB — GLUCOSE, CAPILLARY: Glucose-Capillary: 110 mg/dL — ABNORMAL HIGH (ref 65–99)

## 2017-08-21 SURGERY — LYSIS, ADHESIONS, LAPAROSCOPIC
Anesthesia: General

## 2017-08-21 MED ORDER — CIPROFLOXACIN IN D5W 400 MG/200ML IV SOLN
400.0000 mg | Freq: Once | INTRAVENOUS | Status: AC
  Administered 2017-08-21: 400 mg via INTRAVENOUS
  Filled 2017-08-21: qty 200

## 2017-08-21 MED ORDER — SUGAMMADEX SODIUM 200 MG/2ML IV SOLN
INTRAVENOUS | Status: AC
  Filled 2017-08-21: qty 2

## 2017-08-21 MED ORDER — SODIUM CHLORIDE 0.9 % IV BOLUS
500.0000 mL | Freq: Once | INTRAVENOUS | Status: AC
  Administered 2017-08-21: 500 mL via INTRAVENOUS

## 2017-08-21 MED ORDER — CLONAZEPAM 0.5 MG PO TABS: 0.5000 mg | ORAL_TABLET | Freq: Two times a day (BID) | ORAL | Status: DC | PRN

## 2017-08-21 MED ORDER — MIDAZOLAM HCL 2 MG/2ML IJ SOLN
INTRAMUSCULAR | Status: AC
  Filled 2017-08-21: qty 2

## 2017-08-21 MED ORDER — METRONIDAZOLE IN NACL 5-0.79 MG/ML-% IV SOLN
500.0000 mg | Freq: Once | INTRAVENOUS | Status: AC
  Administered 2017-08-21: 500 mg via INTRAVENOUS
  Filled 2017-08-21: qty 100

## 2017-08-21 MED ORDER — SUCCINYLCHOLINE CHLORIDE 20 MG/ML IJ SOLN
INTRAMUSCULAR | Status: DC | PRN
  Administered 2017-08-21: 100 mg via INTRAVENOUS

## 2017-08-21 MED ORDER — FENTANYL CITRATE (PF) 100 MCG/2ML IJ SOLN
INTRAMUSCULAR | Status: AC
  Filled 2017-08-21: qty 2

## 2017-08-21 MED ORDER — BUPIVACAINE-EPINEPHRINE (PF) 0.25% -1:200000 IJ SOLN
INTRAMUSCULAR | Status: AC
  Filled 2017-08-21: qty 30

## 2017-08-21 MED ORDER — BUPIVACAINE-EPINEPHRINE 0.25% -1:200000 IJ SOLN
INTRAMUSCULAR | Status: DC | PRN
  Administered 2017-08-21: 30 mL

## 2017-08-21 MED ORDER — ACETAMINOPHEN 10 MG/ML IV SOLN
INTRAVENOUS | Status: AC
Start: 2017-08-21 — End: ?
  Filled 2017-08-21: qty 100

## 2017-08-21 MED ORDER — ONDANSETRON 4 MG PO TBDP: 4.0000 mg | ORAL_TABLET | Freq: Four times a day (QID) | ORAL | Status: DC | PRN

## 2017-08-21 MED ORDER — ONDANSETRON HCL 4 MG/2ML IJ SOLN
INTRAMUSCULAR | Status: AC
  Filled 2017-08-21: qty 2

## 2017-08-21 MED ORDER — LIDOCAINE HCL (CARDIAC) PF 100 MG/5ML IV SOSY
PREFILLED_SYRINGE | INTRAVENOUS | Status: DC | PRN
  Administered 2017-08-21: 80 mg via INTRAVENOUS

## 2017-08-21 MED ORDER — SODIUM CHLORIDE FLUSH 0.9 % IV SOLN
INTRAVENOUS | Status: AC
  Filled 2017-08-21: qty 10

## 2017-08-21 MED ORDER — PROPOFOL 10 MG/ML IV BOLUS
INTRAVENOUS | Status: AC
  Filled 2017-08-21: qty 20

## 2017-08-21 MED ORDER — FAMOTIDINE 20 MG PO TABS
20.0000 mg | ORAL_TABLET | Freq: Once | ORAL | Status: AC
  Administered 2017-08-21: 20 mg via ORAL

## 2017-08-21 MED ORDER — NEOSTIGMINE METHYLSULFATE 10 MG/10ML IV SOLN
INTRAVENOUS | Status: AC
Start: 2017-08-21 — End: ?
  Filled 2017-08-21: qty 1

## 2017-08-21 MED ORDER — PROPOFOL 10 MG/ML IV BOLUS
INTRAVENOUS | Status: DC | PRN
  Administered 2017-08-21: 160 mg via INTRAVENOUS

## 2017-08-21 MED ORDER — CIPROFLOXACIN IN D5W 400 MG/200ML IV SOLN
400.0000 mg | Freq: Once | INTRAVENOUS | Status: AC
  Administered 2017-08-21: 400 mg via INTRAVENOUS

## 2017-08-21 MED ORDER — EPHEDRINE SULFATE 50 MG/ML IJ SOLN
INTRAMUSCULAR | Status: DC | PRN
  Administered 2017-08-21: 10 mg via INTRAVENOUS
  Administered 2017-08-21 (×3): 5 mg via INTRAVENOUS

## 2017-08-21 MED ORDER — OXYCODONE HCL 5 MG/5ML PO SOLN: 5.0000 mg | Freq: Once | ORAL | Status: DC | PRN

## 2017-08-21 MED ORDER — SODIUM CHLORIDE FLUSH 0.9 % IV SOLN
INTRAVENOUS | Status: AC
  Filled 2017-08-21: qty 50

## 2017-08-21 MED ORDER — DEXAMETHASONE SODIUM PHOSPHATE 10 MG/ML IJ SOLN
INTRAMUSCULAR | Status: DC | PRN
  Administered 2017-08-21: 5 mg via INTRAVENOUS

## 2017-08-21 MED ORDER — FAMOTIDINE 20 MG PO TABS
ORAL_TABLET | ORAL | Status: AC
  Administered 2017-08-21: 20 mg via ORAL
  Filled 2017-08-21: qty 1

## 2017-08-21 MED ORDER — PROCHLORPERAZINE EDISYLATE 10 MG/2ML IJ SOLN
5.0000 mg | Freq: Four times a day (QID) | INTRAMUSCULAR | Status: DC | PRN
  Filled 2017-08-21: qty 2

## 2017-08-21 MED ORDER — DIPHENHYDRAMINE HCL 12.5 MG/5ML PO ELIX
12.5000 mg | ORAL_SOLUTION | Freq: Four times a day (QID) | ORAL | Status: DC | PRN
  Filled 2017-08-21: qty 5

## 2017-08-21 MED ORDER — ROCURONIUM BROMIDE 100 MG/10ML IV SOLN
INTRAVENOUS | Status: DC | PRN
  Administered 2017-08-21: 30 mg via INTRAVENOUS
  Administered 2017-08-21: 20 mg via INTRAVENOUS

## 2017-08-21 MED ORDER — PROMETHAZINE HCL 25 MG/ML IJ SOLN
INTRAMUSCULAR | Status: AC
  Administered 2017-08-21: 12.5 mg via INTRAVENOUS
  Filled 2017-08-21: qty 1

## 2017-08-21 MED ORDER — GABAPENTIN 600 MG PO TABS
300.0000 mg | ORAL_TABLET | Freq: Three times a day (TID) | ORAL | Status: DC
  Administered 2017-08-21 – 2017-08-23 (×5): 300 mg via ORAL
  Filled 2017-08-21 (×5): qty 1

## 2017-08-21 MED ORDER — METHOCARBAMOL 500 MG PO TABS: 500.0000 mg | ORAL_TABLET | Freq: Four times a day (QID) | ORAL | Status: DC | PRN

## 2017-08-21 MED ORDER — PROCHLORPERAZINE MALEATE 10 MG PO TABS
10.0000 mg | ORAL_TABLET | Freq: Four times a day (QID) | ORAL | Status: DC | PRN
  Filled 2017-08-21: qty 1

## 2017-08-21 MED ORDER — ACETAMINOPHEN 500 MG PO TABS
1000.0000 mg | ORAL_TABLET | Freq: Four times a day (QID) | ORAL | Status: DC
  Administered 2017-08-21 – 2017-08-23 (×6): 1000 mg via ORAL
  Filled 2017-08-21 (×6): qty 2

## 2017-08-21 MED ORDER — ROCURONIUM BROMIDE 50 MG/5ML IV SOLN
INTRAVENOUS | Status: AC
  Filled 2017-08-21: qty 1

## 2017-08-21 MED ORDER — ONDANSETRON HCL 4 MG/2ML IJ SOLN: 4.0000 mg | Freq: Four times a day (QID) | INTRAMUSCULAR | Status: DC | PRN

## 2017-08-21 MED ORDER — SUGAMMADEX SODIUM 200 MG/2ML IV SOLN
INTRAVENOUS | Status: DC | PRN
  Administered 2017-08-21: 150 mg via INTRAVENOUS

## 2017-08-21 MED ORDER — GLYCOPYRROLATE 0.2 MG/ML IJ SOLN
INTRAMUSCULAR | Status: AC
Start: 2017-08-21 — End: ?
  Filled 2017-08-21: qty 3

## 2017-08-21 MED ORDER — FENTANYL CITRATE (PF) 100 MCG/2ML IJ SOLN
25.0000 ug | INTRAMUSCULAR | Status: DC | PRN
  Administered 2017-08-21 (×4): 25 ug via INTRAVENOUS

## 2017-08-21 MED ORDER — PHENYLEPHRINE HCL 10 MG/ML IJ SOLN
INTRAMUSCULAR | Status: DC | PRN
  Administered 2017-08-21 (×2): 100 ug via INTRAVENOUS
  Administered 2017-08-21: 200 ug via INTRAVENOUS
  Administered 2017-08-21 (×2): 100 ug via INTRAVENOUS
  Administered 2017-08-21: 150 ug via INTRAVENOUS
  Administered 2017-08-21: 100 ug via INTRAVENOUS
  Administered 2017-08-21: 150 ug via INTRAVENOUS
  Administered 2017-08-21: 100 ug via INTRAVENOUS

## 2017-08-21 MED ORDER — LACTATED RINGERS IV SOLN
INTRAVENOUS | Status: DC | PRN
  Administered 2017-08-21: 12:00:00 via INTRAVENOUS

## 2017-08-21 MED ORDER — CIPROFLOXACIN IN D5W 400 MG/200ML IV SOLN
400.0000 mg | Freq: Once | INTRAVENOUS | Status: AC
  Administered 2017-08-22: 400 mg via INTRAVENOUS
  Filled 2017-08-21: qty 200

## 2017-08-21 MED ORDER — SODIUM CHLORIDE 0.9 % IV SOLN
INTRAVENOUS | Status: DC
  Administered 2017-08-21 – 2017-08-22 (×4): via INTRAVENOUS

## 2017-08-21 MED ORDER — FENTANYL CITRATE (PF) 100 MCG/2ML IJ SOLN
INTRAMUSCULAR | Status: DC | PRN
  Administered 2017-08-21: 100 ug via INTRAVENOUS
  Administered 2017-08-21: 50 ug via INTRAVENOUS

## 2017-08-21 MED ORDER — LIDOCAINE HCL (PF) 2 % IJ SOLN
INTRAMUSCULAR | Status: AC
  Filled 2017-08-21: qty 10

## 2017-08-21 MED ORDER — CIPROFLOXACIN IN D5W 400 MG/200ML IV SOLN
INTRAVENOUS | Status: AC
  Filled 2017-08-21: qty 200

## 2017-08-21 MED ORDER — MIDAZOLAM HCL 2 MG/2ML IJ SOLN
INTRAMUSCULAR | Status: DC | PRN
  Administered 2017-08-21: 2 mg via INTRAVENOUS

## 2017-08-21 MED ORDER — ENOXAPARIN SODIUM 30 MG/0.3ML ~~LOC~~ SOLN
30.0000 mg | SUBCUTANEOUS | Status: DC
  Administered 2017-08-22 – 2017-08-23 (×2): 30 mg via SUBCUTANEOUS
  Filled 2017-08-21 (×2): qty 0.3

## 2017-08-21 MED ORDER — OXYCODONE HCL 5 MG PO TABS: 5.0000 mg | ORAL_TABLET | Freq: Once | ORAL | Status: DC | PRN

## 2017-08-21 MED ORDER — FENTANYL CITRATE (PF) 100 MCG/2ML IJ SOLN
INTRAMUSCULAR | Status: AC
  Administered 2017-08-21: 25 ug via INTRAVENOUS
  Filled 2017-08-21: qty 2

## 2017-08-21 MED ORDER — PANTOPRAZOLE SODIUM 40 MG PO TBEC
40.0000 mg | DELAYED_RELEASE_TABLET | Freq: Every day | ORAL | Status: DC
  Administered 2017-08-21 – 2017-08-23 (×3): 40 mg via ORAL
  Filled 2017-08-21 (×3): qty 1

## 2017-08-21 MED ORDER — BUPIVACAINE LIPOSOME 1.3 % IJ SUSP
INTRAMUSCULAR | Status: AC
Start: 2017-08-21 — End: ?
  Filled 2017-08-21: qty 20

## 2017-08-21 MED ORDER — DIPHENHYDRAMINE HCL 50 MG/ML IJ SOLN: 12.5000 mg | Freq: Four times a day (QID) | INTRAMUSCULAR | Status: DC | PRN

## 2017-08-21 MED ORDER — ONDANSETRON HCL 4 MG/2ML IJ SOLN
INTRAMUSCULAR | Status: DC | PRN
  Administered 2017-08-21: 4 mg via INTRAVENOUS

## 2017-08-21 MED ORDER — HYDROMORPHONE HCL 1 MG/ML IJ SOLN
0.5000 mg | INTRAMUSCULAR | Status: DC | PRN
  Administered 2017-08-21 (×3): 0.5 mg via INTRAVENOUS
  Filled 2017-08-21 (×3): qty 0.5

## 2017-08-21 MED ORDER — DEXAMETHASONE SODIUM PHOSPHATE 10 MG/ML IJ SOLN
INTRAMUSCULAR | Status: AC
  Filled 2017-08-21: qty 1

## 2017-08-21 MED ORDER — SODIUM CHLORIDE 0.9 % IV SOLN
INTRAVENOUS | Status: DC | PRN
  Administered 2017-08-21: 50 mL

## 2017-08-21 MED ORDER — OXYCODONE HCL 5 MG PO TABS
5.0000 mg | ORAL_TABLET | ORAL | Status: DC | PRN
  Administered 2017-08-22: 5 mg via ORAL
  Filled 2017-08-21: qty 1

## 2017-08-21 MED ORDER — EPHEDRINE SULFATE 50 MG/ML IJ SOLN
INTRAMUSCULAR | Status: AC
  Filled 2017-08-21: qty 1

## 2017-08-21 MED ORDER — PROMETHAZINE HCL 25 MG/ML IJ SOLN
6.2500 mg | INTRAMUSCULAR | Status: DC | PRN
  Administered 2017-08-21: 12.5 mg via INTRAVENOUS

## 2017-08-21 MED ORDER — SUCCINYLCHOLINE CHLORIDE 20 MG/ML IJ SOLN
INTRAMUSCULAR | Status: AC
  Filled 2017-08-21: qty 1

## 2017-08-21 SURGICAL SUPPLY — 50 items
ADH SKN CLS APL DERMABOND .7 (GAUZE/BANDAGES/DRESSINGS) ×2
APPLIER CLIP 5 13 M/L LIGAMAX5 (MISCELLANEOUS)
APR CLP MED LRG 5 ANG JAW (MISCELLANEOUS)
BAG SPEC RTRVL LRG 6X4 10 (ENDOMECHANICALS)
BARRIER ADH SEPRAFILM 3INX5IN (MISCELLANEOUS) ×1 IMPLANT
BLADE CLIPPER SURG (BLADE) ×1 IMPLANT
BRR ADH 5X3 SEPRAFILM 2 SHT (MISCELLANEOUS) ×2
CANISTER SUCT 1200ML W/VALVE (MISCELLANEOUS) ×3 IMPLANT
CHLORAPREP W/TINT 26ML (MISCELLANEOUS) ×3 IMPLANT
CLIP APPLIE 5 13 M/L LIGAMAX5 (MISCELLANEOUS) ×2 IMPLANT
DERMABOND ADVANCED (GAUZE/BANDAGES/DRESSINGS) ×1
DERMABOND ADVANCED .7 DNX12 (GAUZE/BANDAGES/DRESSINGS) ×2 IMPLANT
ELECT BLADE 6.5 EXT (BLADE) ×2 IMPLANT
ELECT CAUTERY BLADE 6.4 (BLADE) ×3 IMPLANT
ELECT REM PT RETURN 9FT ADLT (ELECTROSURGICAL) ×3
ELECTRODE REM PT RTRN 9FT ADLT (ELECTROSURGICAL) ×2 IMPLANT
GELPORT LAPAROSCOPIC (MISCELLANEOUS) IMPLANT
GLOVE BIO SURGEON STRL SZ7 (GLOVE) ×3 IMPLANT
GOWN STRL REUS W/ TWL LRG LVL3 (GOWN DISPOSABLE) ×4 IMPLANT
GOWN STRL REUS W/TWL LRG LVL3 (GOWN DISPOSABLE) ×6
HANDLE YANKAUER SUCT BULB TIP (MISCELLANEOUS) ×1 IMPLANT
IRRIGATION STRYKERFLOW (MISCELLANEOUS) ×1 IMPLANT
IRRIGATOR STRYKERFLOW (MISCELLANEOUS)
IV NS 1000ML (IV SOLUTION) ×3
IV NS 1000ML BAXH (IV SOLUTION) ×2 IMPLANT
NEEDLE HYPO 22GX1.5 SAFETY (NEEDLE) ×3 IMPLANT
NS IRRIG 500ML POUR BTL (IV SOLUTION) ×3 IMPLANT
PACK LAP CHOLECYSTECTOMY (MISCELLANEOUS) ×3 IMPLANT
PENCIL ELECTRO HAND CTR (MISCELLANEOUS) ×3 IMPLANT
POUCH SPECIMEN RETRIEVAL 10MM (ENDOMECHANICALS) IMPLANT
RELOAD PROXIMATE 75MM BLUE (ENDOMECHANICALS) ×12 IMPLANT
RELOAD STAPLE 75 3.8 BLU REG (ENDOMECHANICALS) IMPLANT
SCISSORS METZENBAUM CVD 33 (INSTRUMENTS) ×2 IMPLANT
SHEARS HARMONIC ACE PLUS 36CM (ENDOMECHANICALS) IMPLANT
SLEEVE ENDOPATH XCEL 5M (ENDOMECHANICALS) ×3 IMPLANT
SPONGE LAP 18X18 5 PK (GAUZE/BANDAGES/DRESSINGS) ×3 IMPLANT
STAPLER PROXIMATE 75MM BLUE (STAPLE) ×2 IMPLANT
SUT MNCRL AB 4-0 PS2 18 (SUTURE) ×1 IMPLANT
SUT PDS AB 0 CT1 27 (SUTURE) ×3 IMPLANT
SUT SILK 2 0 (SUTURE) ×3
SUT SILK 2 0SH CR/8 30 (SUTURE) ×1 IMPLANT
SUT SILK 2-0 18XBRD TIE 12 (SUTURE) IMPLANT
SUT VIC AB 2-0 SH 27 (SUTURE) ×3
SUT VIC AB 2-0 SH 27XBRD (SUTURE) ×1 IMPLANT
SUT VICRYL 0 AB UR-6 (SUTURE) ×6 IMPLANT
SYR 20CC LL (SYRINGE) ×3 IMPLANT
TRAY FOLEY MTR SLVR 16FR STAT (SET/KITS/TRAYS/PACK) ×2 IMPLANT
TROCAR XCEL BLUNT TIP 100MML (ENDOMECHANICALS) ×3 IMPLANT
TROCAR XCEL NON-BLD 5MMX100MML (ENDOMECHANICALS) ×6 IMPLANT
TUBING INSUF HEATED (TUBING) ×3 IMPLANT

## 2017-08-21 NOTE — Anesthesia Procedure Notes (Signed)
Procedure Name: Intubation Date/Time: 08/21/2017 8:54 AM Performed by: Hedda Slade, CRNA Pre-anesthesia Checklist: Patient identified, Patient being monitored, Timeout performed, Emergency Drugs available and Suction available Patient Re-evaluated:Patient Re-evaluated prior to induction Oxygen Delivery Method: Circle system utilized Preoxygenation: Pre-oxygenation with 100% oxygen Induction Type: IV induction, Cricoid Pressure applied and Rapid sequence Laryngoscope Size: Mac and 3 Grade View: Grade I Tube type: Oral Tube size: 7.0 mm Number of attempts: 1 Airway Equipment and Method: Stylet Placement Confirmation: ETT inserted through vocal cords under direct vision,  positive ETCO2 and breath sounds checked- equal and bilateral Secured at: 21 cm Tube secured with: Tape Dental Injury: Teeth and Oropharynx as per pre-operative assessment

## 2017-08-21 NOTE — Anesthesia Post-op Follow-up Note (Signed)
Anesthesia QCDR form completed.        

## 2017-08-21 NOTE — Transfer of Care (Signed)
Immediate Anesthesia Transfer of Care Note  Patient: [name]  Procedure(s) Performed: LAPAROSCOPIC LYSIS OF ADHESIONS CONVERTED TO OPEN (N/A ) SMALL BOWEL RESECTION  Patient Location: PACU  Anesthesia Type:General  Level of Consciousness: awake, alert  and oriented  Airway & Oxygen Therapy: Patient Spontanous Breathing and Patient connected to face mask oxygen  Post-op Assessment: Report given to RN and Post -op Vital signs reviewed and stable  Post vital signs: Reviewed and stable  Last Vitals:  Vitals Value Taken Time  BP 144/77 08/21/2017 12:35 PM  Temp 36.7 C 08/21/2017 12:35 PM  Pulse 93 08/21/2017 12:35 PM  Resp 11 08/21/2017 12:35 PM  SpO2 100 % 08/21/2017 12:35 PM  Vitals shown include unvalidated device data.  Last Pain:  Vitals:   08/21/17 0739  TempSrc: Temporal         Complications: No apparent anesthesia complications

## 2017-08-21 NOTE — Anesthesia Preprocedure Evaluation (Signed)
Anesthesia Evaluation  Patient identified by MRN, date of birth, ID band Patient awake    Reviewed: Allergy & Precautions, H&P , NPO status , Patient's Chart, lab work & pertinent test results  History of Anesthesia Complications (+) PONV and history of anesthetic complications  Airway Mallampati: II  TM Distance: >3 FB Neck ROM: full    Dental  (+) Chipped   Pulmonary neg pulmonary ROS, neg shortness of breath,           Cardiovascular Exercise Tolerance: Good hypertension, (-) angina(-) Past MI and (-) DOE      Neuro/Psych PSYCHIATRIC DISORDERS Anxiety Depression negative neurological ROS     GI/Hepatic negative GI ROS, Neg liver ROS, neg GERD  ,  Endo/Other  diabetes, Type 2  Renal/GU      Musculoskeletal   Abdominal   Peds  Hematology negative hematology ROS (+)   Anesthesia Other Findings Patient is NPO appropriate and reports no nausea or vomiting today.   Past Medical History: No date: Borderline diabetic No date: Bowel obstruction (HCC) No date: Complication of anesthesia     Comment:  aspirated after breast reduction surgery and had n/v               with that surgery No date: Depression No date: Diabetes mellitus without complication (HCC) No date: Ectopic pregnancy No date: Hypercholesteremia No date: Hypertension 07/25/2017: Mild cognitive impairment No date: Sleep disorder  Past Surgical History: 2003: ABDOMINAL HYSTERECTOMY 1993: BREAST REDUCTION SURGERY; Bilateral No date: BREAST SURGERY 2003: CESAREAN SECTION No date: COLONOSCOPY No date: ECTOPIC PREGNANCY SURGERY     Comment:  tubal removel No date: ESOPHAGOGASTRODUODENOSCOPY No date: EXPLORATORY LAPAROTOMY No date: TONSILLECTOMY No date: UTERINE FIBROID SURGERY     Reproductive/Obstetrics negative OB ROS                             Anesthesia Physical Anesthesia Plan  ASA: III  Anesthesia Plan:  General ETT, Rapid Sequence and Cricoid Pressure   Post-op Pain Management:    Induction: Intravenous  PONV Risk Score and Plan: Ondansetron, Dexamethasone and Midazolam  Airway Management Planned: Oral ETT  Additional Equipment:   Intra-op Plan:   Post-operative Plan: Extubation in OR  Informed Consent: I have reviewed the patients History and Physical, chart, labs and discussed the procedure including the risks, benefits and alternatives for the proposed anesthesia with the patient or authorized representative who has indicated his/her understanding and acceptance.   Dental Advisory Given  Plan Discussed with: Anesthesiologist, CRNA and Surgeon  Anesthesia Plan Comments: (Plan for awake extubation   Patient consented for risks of anesthesia including but not limited to:  - adverse reactions to medications - damage to teeth, lips or other oral mucosa - sore throat or hoarseness - Damage to heart, brain, lungs or loss of life  Patient voiced understanding.)        Anesthesia Quick Evaluation

## 2017-08-21 NOTE — Interval H&P Note (Signed)
History and Physical Interval Note:  08/21/2017 8:32 AM  [name]  has presented today for surgery, with the diagnosis of sbo  The various methods of treatment have been discussed with the patient and family. After consideration of risks, benefits and other options for treatment, the patient has consented to  Procedure(s) with comments: LAPAROSCOPIC LYSIS OF ADHESIONS (N/A) - Both arms tucked as a surgical intervention .  The patient's history has been reviewed, patient examined, no change in status, stable for surgery.  I have reviewed the patient's chart and labs.  Questions were answered to the patient's satisfaction.     Diego F Pabon

## 2017-08-21 NOTE — Op Note (Signed)
PROCEDURES: 1. Laparoscopic lysis of adhesion 2. Laparotomy w extensive LOA taking at least 2 hrs of total operative time 3. Small bowel resection w primary anastomosis 4. Appendectomy  Pre-operative Diagnosis: Recurrent SBO  Post-operative Diagnosis: Same  Surgeon: Merri Ray Pabon   Anesthesia: General endotracheal anesthesia  ASA Class: 2   Surgeon: Sterling Big , MD FACS  Anesthesia: Gen. with endotracheal tub  Findings: Extremely dense adhesions, thick and extensive from the SB to abd wall, sb-sb, colon to abd wall. Adhesions were plastered and fused to midline fascia making impossible not to create enterotomy to complete LOA   Estimated Blood Loss: 50cc               Specimens: SB and appendix       Complications: none               Condition: stable  Procedure Details  The patient was seen again in the Holding Room. The benefits, complications, treatment options, and expected outcomes were discussed with the patient. The risks of bleeding, infection, recurrence of symptoms, failure to resolve symptoms,  bowel injury, any of which could require further surgery were reviewed with the patient.   The patient was taken to Operating Room, identified as [name] and the procedure verified.  A Time Out was held and the above information confirmed.  Prior to the induction of general anesthesia, antibiotic prophylaxis was administered. VTE prophylaxis was in place. General endotracheal anesthesia was then administered and tolerated well. After the induction, the abdomen was prepped with Chloraprep and draped in the sterile fashion. The patient was positioned in the supine position.  Umbilical incision was created and a conduct technique was used identifying the fascia and incising it.  Using hemostat we entered the abdominal cavity under direct visualization.  Please note that this was all the way from the area where we thought the adhesions were.  Stay sutures were placed on each  side of the fascia and a Hassan trocar was placed and pneumoperitoneum was obtained.  Two 5 mm ports were placed on the left lower quadrant under direct visualization. We were able to do some finger fracturing around the periumbilical incision to start the lysis of adhesions.  We visualized dense adhesions and then the bowel , search to the midline of the abdominal wall.  We tried to perform some lysis of adhesions for about 15 minutes or so but we realized that this were far too dense to be able to perform laparoscopically.  This point we perform a midline generous laparotomy and realized that there was a plaster piece of bowel that was fused to the abdominal wall.  We continue our sharp lysis of adhesions in an open fashion with Metzenbaum scissors but because the small bowel was almost eroding into the fascia and there was no plane we created multiple enterotomies.  We finally were able to down diffuse bowel from the abdominal wall after very careful lysis of adhesions and of also excising some of the fascia with it.  We did realize that there was about a 14 cm segment with multiple enterotomies and because of that I decided to perform small bowel resection.  A window along the small bowel was selected proximal and distal and the bowel was divided with a 75 standard GIA staple and side-to-side functional end-to-end staple anastomosis was created in the standard fashion with a GIA-75.  Continue our extensive lysis of adhesions using Metzenbaum scissors to be able to free  up the small bowel.  There were interloop adhesions that were very thick and almost look that the adhesions were gluing any pieces of bowel together. Again this took extensive work and very careful dissection to be able to complete lysis of adhesions.  Finally after more than 90 minutes in close to 2 hours all the adhesions were lysed and were able to run the bowel from the ligament of Treitz all the way to the terminal ileum.  We found that  anastomosis were widely patent without evidence of leak and there was no evidence of any other injuries.  There was no evidence of any tumors or other abdominal pathology.  Given the extensive adhesions I look to the appendix and proceeded to perform an appendectomy after elevating the appendix between Babcock clamps.  The mesentery was divided and suture-ligated in the standard fashion and the appendix divided with a GIA 75 load. We also closed the mesenteric defect with interrupted 2-0 Vicryl sutures. Abdominal cavity was irrigated with normal saline. Dense of any injuries and hemostasis was appropriate  We changed gloves and place a new tray to close the abdomen with a 0 PDS suture in a running fashion and the skin was closed with staples. Liposomal Marcaine was injected on all incision sites under direct visualization.  Needle and laparotomy count were correct and there were no immediate complications.  Sterling Big, MD, FACS

## 2017-08-22 LAB — COMPREHENSIVE METABOLIC PANEL
ALBUMIN: 2.7 g/dL — AB (ref 3.5–5.0)
ALK PHOS: 49 U/L (ref 38–126)
ALT: 22 U/L (ref 14–54)
ANION GAP: 5 (ref 5–15)
AST: 27 U/L (ref 15–41)
BILIRUBIN TOTAL: 0.6 mg/dL (ref 0.3–1.2)
BUN: 15 mg/dL (ref 6–20)
CO2: 28 mmol/L (ref 22–32)
Calcium: 8 mg/dL — ABNORMAL LOW (ref 8.9–10.3)
Chloride: 105 mmol/L (ref 101–111)
Creatinine, Ser: 0.99 mg/dL (ref 0.44–1.00)
GFR calc Af Amer: >60 mL/min (ref >60)
GFR calc non Af Amer: >60 mL/min (ref >60)
GLUCOSE: 145 mg/dL — AB (ref 65–99)
POTASSIUM: 3.3 mmol/L — AB (ref 3.5–5.1)
SODIUM: 138 mmol/L (ref 135–145)
TOTAL PROTEIN: 5.5 g/dL — AB (ref 6.5–8.1)

## 2017-08-22 LAB — CBC
HEMATOCRIT: 28.4 % — AB (ref 35.0–47.0)
Hemoglobin: 9.3 g/dL — ABNORMAL LOW (ref 12.0–16.0)
MCH: 25.8 pg — AB (ref 26.0–34.0)
MCHC: 32.7 g/dL (ref 32.0–36.0)
MCV: 78.8 fL — AB (ref 80.0–100.0)
Platelets: 243 10*3/uL (ref 150–440)
RBC: 3.6 MIL/uL — AB (ref 3.80–5.20)
RDW: 14.6 % — ABNORMAL HIGH (ref 11.5–14.5)
WBC: 9.5 10*3/uL (ref 3.6–11.0)

## 2017-08-22 LAB — PHOSPHORUS: Phosphorus: 3.6 mg/dL (ref 2.5–4.6)

## 2017-08-22 LAB — SURGICAL PATHOLOGY

## 2017-08-22 LAB — MAGNESIUM: Magnesium: 1.7 mg/dL (ref 1.7–2.4)

## 2017-08-22 MED ORDER — POTASSIUM CHLORIDE CRYS ER 20 MEQ PO TBCR
40.0000 meq | EXTENDED_RELEASE_TABLET | Freq: Two times a day (BID) | ORAL | Status: AC
  Administered 2017-08-22 (×2): 40 meq via ORAL
  Filled 2017-08-22 (×2): qty 2

## 2017-08-22 MED ORDER — KETOROLAC TROMETHAMINE 15 MG/ML IJ SOLN
15.0000 mg | Freq: Four times a day (QID) | INTRAMUSCULAR | Status: DC
  Administered 2017-08-22 – 2017-08-23 (×4): 15 mg via INTRAVENOUS
  Filled 2017-08-22 (×4): qty 1

## 2017-08-22 NOTE — Progress Notes (Signed)
Chaplain was met in hall way by Pt daughter who recognized  Chaplain as a former clergy at [employer]. They escorted the chaplain to the Pt.room. Chaplain prayed for family.    08/22/17 2000  Clinical Encounter Type  Visited With Patient and family together  Visit Type Spiritual support  Referral From Family  Spiritual Encounters  Spiritual Needs Prayer

## 2017-08-22 NOTE — Progress Notes (Signed)
POD # 1 Doing well Taking clears No flatus Pain better controlled AVSS, labs ok  PE NAD Abd: soft, incision w dressing intact, no peritonitis or infection Ext: well perfused and no edema  A/p  Doing well Clears for now Ambulate

## 2017-08-22 NOTE — Anesthesia Postprocedure Evaluation (Signed)
Anesthesia Post Note  Patient: [name]  Procedure(s) Performed: LAPAROSCOPIC LYSIS OF ADHESIONS CONVERTED TO OPEN (N/A ) SMALL BOWEL RESECTION WITH PRIMARY ANASTOMOSIS APPENDECTOMY  Patient location during evaluation: PACU Anesthesia Type: General Level of consciousness: awake and alert Pain management: pain level controlled Vital Signs Assessment: post-procedure vital signs reviewed and stable Respiratory status: spontaneous breathing, nonlabored ventilation, respiratory function stable and patient connected to nasal cannula oxygen Cardiovascular status: blood pressure returned to baseline and stable Postop Assessment: no apparent nausea or vomiting Anesthetic complications: no     Last Vitals:  Vitals:   08/22/17 0019 08/22/17 0438  BP: 128/73 136/71  Pulse: 100 83  Resp: 20 20  Temp: 37.6 C 37 C  SpO2: 96% 98%    Last Pain:  Vitals:   08/22/17 0438  TempSrc: Oral  PainSc:                  Christia Reading

## 2017-08-23 ENCOUNTER — Telehealth: Payer: Self-pay

## 2017-08-23 LAB — BASIC METABOLIC PANEL
Anion gap: 4 — ABNORMAL LOW (ref 5–15)
BUN: 11 mg/dL (ref 6–20)
CALCIUM: 7.9 mg/dL — AB (ref 8.9–10.3)
CO2: 26 mmol/L (ref 22–32)
Chloride: 111 mmol/L (ref 101–111)
Creatinine, Ser: 0.94 mg/dL (ref 0.44–1.00)
GFR calc Af Amer: >60 mL/min (ref >60)
GLUCOSE: 93 mg/dL (ref 65–99)
Potassium: 3.8 mmol/L (ref 3.5–5.1)
Sodium: 141 mmol/L (ref 135–145)

## 2017-08-23 MED ORDER — HYDROCODONE-ACETAMINOPHEN 5-325 MG PO TABS: 1.0000 | ORAL_TABLET | Freq: Four times a day (QID) | ORAL | 0 refills | Status: DC | PRN

## 2017-08-23 NOTE — Discharge Instructions (Signed)
Open Small Bowel Resection Small bowel resection is surgery to remove part of the small bowel. The small bowel, also called the small intestine, is the top part of the intestines. It is part of the digestive system. When food leaves the stomach, it goes into the small bowel. Most food is then absorbed into the body. A small bowel resection may be needed if the small bowel becomes blocked or harmed by disease. One type of procedure is called an open resection. An open surgery means that the surgeon will make a long incision to open up your abdomen for the procedure. Tell a health care provider about:  Any allergies you have.  All medicines you are taking, including vitamins, herbs, eye drops, creams, and over-the-counter medicines.  Any problems you or family members have had with anesthetic medicines.  Any blood disorders you have.  Any surgeries you have had.  Any medical conditions you have.  Whether you are pregnant or may be pregnant. What are the risks? Generally, this is a safe procedure. However, problems may occur, including:  Bleeding.  Infection.  Allergic reactions to medicines.  Damage to other structures or organs.  A blood clot that forms somewhere in the veins and travels to the lung (pulmonary embolism).  Leaking of intestinal fluids into the abdomen.  A hernia. This occurs when the abdomen bulges out. It may require surgery in the future.  Scarring where the incision is made or inside your body, around the intestines. If this occurs, surgery may be required in the future.  Not being able to absorb enough vitamins and nutrition through the small bowel.  What happens before the procedure? Staying hydrated Follow instructions from your health care provider about hydration, which may include:  Up to 2 hours before the procedure - you may continue to drink clear liquids, such as water, clear fruit juice, black coffee, and plain tea.  Eating and drinking  restrictions Follow instructions from your health care provider about eating and drinking, which may include:  8 hours before the procedure - stop eating heavy meals or foods such as meat, fried foods, or fatty foods.  6 hours before the procedure - stop eating light meals or foods, such as toast or cereal.  6 hours before the procedure - stop drinking milk or drinks that contain milk.  2 hours before the procedure - stop drinking clear liquids.  Medicines  Ask your health care provider about: ? Changing or stopping your regular medicines. This is especially important if you are taking diabetes medicines or blood thinners. ? Taking medicines such as aspirin and ibuprofen. These medicines can thin your blood. Do not take these medicines before your procedure if your health care provider instructs you not to.  You may be given antibiotic medicine to help prevent infection.  You may need to take medicine to clean out your bowels before surgery (bowel prep). General instructions  Ask your health care provider how your surgical site will be marked or identified.  You may be asked to shower with a germ-killing soap.  You may have testing, such as blood tests, X-rays, or other imaging scans that take pictures of the small bowel.  Plan to have someone take you home from the hospital or clinic.  Arrange for someone to help you with your activities during recovery. What happens during the procedure?  To lower your risk of infection: ? Your health care team will wash or sanitize their hands. ? Your skin will be   washed with soap. ? Hair may be removed from the surgical area.  An IV tube will be inserted into one of your veins. Medicine will flow directly into your body through the IV tube.  You will be given one or both of the following: ? A medicine to help you relax (sedative). ? A medicine to make you fall asleep (general anesthetic).  Several tubes may be put into your body. ? A  tube in your throat will help you breathe during the procedure. It may also be used to give you anesthetic gas during the procedure. ? A nasogastric tube will go through your nose and into your stomach.Fluids from your stomach will drain through this tube during and after the procedure. ? A thin, flexible tube (catheter) in your bladder will drain urine during and after the procedure.  An incision will be made in the middle of your abdomen.  The surgeon will remove the affected piece of small bowel and then join the bowel together again with staples or stitches (sutures). This will allow digested food to pass through again.  The surgeon will close the incision with staples or sutures. The procedure may vary among health care providers and hospitals. What happens after the procedure?  Your blood pressure, heart rate, breathing rate, and blood oxygen level will be monitored until the medicines you were given have worn off.  You will be given medicine for pain as needed.  You will continue to get fluids through the IV tube for a while. Also, the nasogastric tube may stay in for a few days until your bowels are working again.  After the nasogastric tube is out, you can start eating food again. You will start with liquids and then advance to more solid foods.  You will be asked to get up and start walking within a day. This helps to keep blood clots from forming in your legs.  You may be told to breathe deeply and to cough now and then.This keeps your airways open. This information is not intended to replace advice given to you by your health care provider. Make sure you discuss any questions you have with your health care provider. Document Released: 08/29/2010 Document Revised: 01/04/2016 Document Reviewed: 12/27/2015 Elsevier Interactive Patient Education  2018 [employer]  

## 2017-08-23 NOTE — Discharge Summary (Signed)
Patient ID: [name] MRN: [id number] DOB/AGE: [birth year] 57 y.o.  Admit date: 08/21/2017 Discharge date: 08/23/2017   Discharge Diagnoses:  Active Problems:   Intestinal adhesions with partial obstruction (HCC)   S/P small bowel resection   Procedures: Laparotomy w bowel resection  Hospital Course: 57 year old female with multiple bowel obstructions in the past scheduled for lysis of adhesions.  Initial attempt to perform laparoscopic lysis of adhesions was not possible given the thickness and effusion of the bowel against the abdominal wall.  She require a laparotomy and small bowel resection.  She did very well postoperatively and was kept on a clear liquid diet until her bowel function returned and she was advanced to a regular diet.  The time of discharge she was ambulating, tolerating regular diet and passing gas.  Her vital signs were stable and she was afebrile.  Her physical exam reveals a female in no acute distress.  Awake and alert.  Abdomen: Soft appropriate incisional tenderness.  No erythema or evidence of infection or peritonitis.  Extremities well-perfused no edema.  Condition of the patient the time of discharge was stable    Disposition: Discharge disposition: 01-Home or Self Care       Discharge Instructions    Call MD for:  difficulty breathing, headache or visual disturbances   Complete by:  As directed    Call MD for:  extreme fatigue   Complete by:  As directed    Call MD for:  hives   Complete by:  As directed    Call MD for:  persistant dizziness or light-headedness   Complete by:  As directed    Call MD for:  persistant nausea and vomiting   Complete by:  As directed    Call MD for:  redness, tenderness, or signs of infection (pain, swelling, redness, odor or green/yellow discharge around incision site)   Complete by:  As directed    Call MD for:  severe uncontrolled pain   Complete by:  As directed    Call MD for:  temperature >100.4   Complete  by:  As directed    Diet - low sodium heart healthy   Complete by:  As directed    Discharge instructions   Complete by:  As directed    Shower daily. Remove dressing before DC. Please give work excuse for 2 more weeks.   Increase activity slowly   Complete by:  As directed    Lifting restrictions   Complete by:  As directed    20 lbs     Allergies as of 08/23/2017      Reactions   Amoxicillin Anaphylaxis   Has patient had a PCN reaction causing immediate rash, facial/tongue/throat swelling, SOB or lightheadedness with hypotension: Yes Has patient had a PCN reaction causing severe rash involving mucus membranes or skin necrosis: No Has patient had a PCN reaction that required hospitalization: Yes Has patient had a PCN reaction occurring within the last 10 years: No If all of the above answers are "NO", then may proceed with Cephalosporin use.   Strawberry Extract Shortness Of Breath, Itching, Swelling   Ibuprofen Other (See Comments)   Acute renal failure   Tetracyclines & Related Nausea And Vomiting, Rash      Medication List    TAKE these medications   aspirin EC 81 MG tablet Take 81 mg by mouth daily.   buPROPion 300 MG 24 hr tablet Commonly known as:  WELLBUTRIN XL Take 300 mg by mouth  at bedtime.   clonazePAM 0.5 MG tablet Commonly known as:  KLONOPIN Take 0.5 mg by mouth at bedtime as needed for anxiety.   HYDROcodone-acetaminophen 5-325 MG tablet Commonly known as:  NORCO/VICODIN Take 1 tablet by mouth every 6 (six) hours as needed for moderate pain.   irbesartan-hydrochlorothiazide 150-12.5 MG tablet Commonly known as:  AVALIDE Take 1 tablet by mouth daily.   metFORMIN 500 MG tablet Commonly known as:  GLUCOPHAGE Take 500 mg by mouth 2 (two) times daily with a meal.      Follow-up Information    Leafy Ro, MD. Go on 09/07/2017.   Specialty:  General Surgery Why:  Dr. Earlene Plater, Friday, 5/31 at 9:45 a.m., [phone] Contact information: [address] [place] [address] [phone]            Sterling Big, MD FACS

## 2017-08-23 NOTE — Telephone Encounter (Signed)
Patient's husband's FMLA was filled out and left at the front desk to be picked up.

## 2017-08-24 ENCOUNTER — Other Ambulatory Visit: Payer: Self-pay

## 2017-08-24 ENCOUNTER — Emergency Department
Admission: EM | Admit: 2017-08-24 | Discharge: 2017-08-25 | Disposition: A | Discharge status: Home / Self Care | Payer: Federal, State, Local not specified - PPO | Attending: Emergency Medicine | Admitting: Emergency Medicine

## 2017-08-24 ENCOUNTER — Emergency Department: Payer: Federal, State, Local not specified - PPO

## 2017-08-24 ENCOUNTER — Encounter: Payer: Self-pay | Admitting: Emergency Medicine

## 2017-08-24 ENCOUNTER — Telehealth: Payer: Self-pay | Admitting: Surgery

## 2017-08-24 DIAGNOSIS — R1084 Generalized abdominal pain: Secondary | ICD-10-CM

## 2017-08-24 DIAGNOSIS — E7800 Pure hypercholesterolemia, unspecified: Secondary | ICD-10-CM | POA: Diagnosis not present

## 2017-08-24 DIAGNOSIS — E119 Type 2 diabetes mellitus without complications: Secondary | ICD-10-CM | POA: Insufficient documentation

## 2017-08-24 DIAGNOSIS — Z79899 Other long term (current) drug therapy: Secondary | ICD-10-CM | POA: Diagnosis not present

## 2017-08-24 DIAGNOSIS — Z7982 Long term (current) use of aspirin: Secondary | ICD-10-CM | POA: Insufficient documentation

## 2017-08-24 DIAGNOSIS — Z7984 Long term (current) use of oral hypoglycemic drugs: Secondary | ICD-10-CM | POA: Insufficient documentation

## 2017-08-24 DIAGNOSIS — K5909 Other constipation: Secondary | ICD-10-CM | POA: Diagnosis not present

## 2017-08-24 DIAGNOSIS — I10 Essential (primary) hypertension: Secondary | ICD-10-CM | POA: Diagnosis not present

## 2017-08-24 DIAGNOSIS — G8918 Other acute postprocedural pain: Secondary | ICD-10-CM | POA: Insufficient documentation

## 2017-08-24 DIAGNOSIS — R140 Abdominal distension (gaseous): Secondary | ICD-10-CM | POA: Diagnosis not present

## 2017-08-24 LAB — CBC
HCT: 30 % — ABNORMAL LOW (ref 35.0–47.0)
HEMOGLOBIN: 9.8 g/dL — AB (ref 12.0–16.0)
MCH: 25.4 pg — AB (ref 26.0–34.0)
MCHC: 32.5 g/dL (ref 32.0–36.0)
MCV: 78.1 fL — ABNORMAL LOW (ref 80.0–100.0)
PLATELETS: 347 10*3/uL (ref 150–440)
RBC: 3.84 MIL/uL (ref 3.80–5.20)
RDW: 14.9 % — ABNORMAL HIGH (ref 11.5–14.5)
WBC: 10.2 10*3/uL (ref 3.6–11.0)

## 2017-08-24 LAB — BASIC METABOLIC PANEL
Anion gap: 9 (ref 5–15)
BUN: 10 mg/dL (ref 6–20)
CHLORIDE: 102 mmol/L (ref 101–111)
CO2: 27 mmol/L (ref 22–32)
Calcium: 8.8 mg/dL — ABNORMAL LOW (ref 8.9–10.3)
Creatinine, Ser: 1.05 mg/dL — ABNORMAL HIGH (ref 0.44–1.00)
GFR calc Af Amer: >60 mL/min (ref >60)
GFR calc non Af Amer: 59 mL/min — ABNORMAL LOW (ref >60)
Glucose, Bld: 103 mg/dL — ABNORMAL HIGH (ref 65–99)
POTASSIUM: 3.7 mmol/L (ref 3.5–5.1)
SODIUM: 138 mmol/L (ref 135–145)

## 2017-08-24 LAB — HEPATIC FUNCTION PANEL
ALK PHOS: 85 U/L (ref 38–126)
ALT: 21 U/L (ref 14–54)
AST: 24 U/L (ref 15–41)
Albumin: 3.2 g/dL — ABNORMAL LOW (ref 3.5–5.0)
BILIRUBIN DIRECT: 0.2 mg/dL (ref 0.1–0.5)
BILIRUBIN INDIRECT: 0.7 mg/dL (ref 0.3–0.9)
TOTAL PROTEIN: 6.8 g/dL (ref 6.5–8.1)
Total Bilirubin: 0.9 mg/dL (ref 0.3–1.2)

## 2017-08-24 LAB — LIPASE, BLOOD: LIPASE: 22 U/L (ref 11–51)

## 2017-08-24 MED ORDER — MORPHINE SULFATE (PF) 4 MG/ML IV SOLN
4.0000 mg | Freq: Once | INTRAVENOUS | Status: AC
  Administered 2017-08-24: 4 mg via INTRAVENOUS
  Filled 2017-08-24: qty 1

## 2017-08-24 MED ORDER — IOPAMIDOL (ISOVUE-300) INJECTION 61%
100.0000 mL | Freq: Once | INTRAVENOUS | Status: AC | PRN
  Administered 2017-08-24: 100 mL via INTRAVENOUS

## 2017-08-24 MED ORDER — ONDANSETRON HCL 4 MG/2ML IJ SOLN
4.0000 mg | Freq: Once | INTRAMUSCULAR | Status: AC
  Administered 2017-08-24: 4 mg via INTRAVENOUS
  Filled 2017-08-24: qty 2

## 2017-08-24 MED ORDER — IOPAMIDOL (ISOVUE-300) INJECTION 61%
30.0000 mL | Freq: Once | INTRAVENOUS | Status: AC
  Administered 2017-08-24: 30 mL via ORAL

## 2017-08-24 MED ORDER — SODIUM CHLORIDE 0.9 % IV BOLUS
1000.0000 mL | Freq: Once | INTRAVENOUS | Status: AC
  Administered 2017-08-24: 1000 mL via INTRAVENOUS

## 2017-08-24 NOTE — Telephone Encounter (Signed)
Called patient back and she stated that she was able to speak with Dr. Everlene Farrier since he is our day doctor at the hospital. She stated that he gave her things to do and if they do not work, to call him back tonight. Patient had no further questions.

## 2017-08-24 NOTE — Telephone Encounter (Signed)
Patient is calling said she has been taken ibuprofen the medication Dr. Everlene Farrier gave her she said she is having some pain, pain level being a nine. Patient is asking if there is something else she could take for the pain. Last night was her first night back at home. Please call patient and advise she can be reached at  [phone].

## 2017-08-24 NOTE — ED Triage Notes (Signed)
Pt arrives ambulatory to triage with c/o constipation. Pt had a bowel resection on Tuesday and has not had a BM since before that time. Pt had swollen abdomen at this time in triage.

## 2017-08-25 MED ORDER — POLYETHYLENE GLYCOL 3350 17 G PO PACK: 17.0000 g | PACK | Freq: Two times a day (BID) | ORAL | 0 refills | Status: DC

## 2017-08-25 MED ORDER — DOCUSATE SODIUM 100 MG PO CAPS: 100.0000 mg | ORAL_CAPSULE | Freq: Two times a day (BID) | ORAL | 0 refills | Status: AC

## 2017-08-25 NOTE — Discharge Instructions (Addendum)
Please ensure that you are drinking lots of fluids, please follow-up with your surgeon for further evaluation, please return with any worsening pain or condition.

## 2017-08-25 NOTE — ED Provider Notes (Signed)
[facility] Emergency Department Provider Note   ____________________________________________   First MD Initiated Contact with Patient 08/24/17 2315     (approximate)  I have reviewed the triage vital signs and the nursing notes.   HISTORY  Chief Complaint Post-op Problem    HPI [name] is a 57 y.o. female who comes into the hospital today with some abdominal pain.  The patient had surgery on Tuesday with lysis of adhesions and a small bowel obstruction.  The patient states that she was discharged yesterday.  She has had some distention and increased pain in her abdomen.  The patient has been taking some pain medicine and ibuprofen but it has not been helping with her pain.  The patient also states she has not had a bowel movement since surgery.  She took some MiraLAX at about 230 today but reports that she was not on a bowel regimen when she was in the hospital.  The patient has been passing gas and denies any vomiting or fevers.  The patient has been urinating well.  She has had no shortness of breath or chest pain.  The patient states that she contacted Dr. Everlene Farrier who told her that if she continued to have pain she should come back and get checked out.  The patient rates her pain a 10 out of 10 in intensity.   Past Medical History:  Diagnosis Date  . Borderline diabetic   . Bowel obstruction (HCC)   . Complication of anesthesia    aspirated after breast reduction surgery and had n/v with that surgery  . Depression   . Diabetes mellitus without complication (HCC)   . Ectopic pregnancy   . Hypercholesteremia   . Hypertension   . Mild cognitive impairment 07/25/2017  . Sleep disorder     Patient Active Problem List   Diagnosis Date Noted  . S/P small bowel resection 08/21/2017  . Intestinal adhesions with partial obstruction (HCC)   . Mild cognitive impairment 07/25/2017  . Small bowel obstruction (HCC) 06/22/2017  . SBO (small bowel  obstruction) (HCC) 11/04/2013  . Partial small bowel obstruction (HCC) 11/04/2013  . Enteritis 11/04/2013  . Diabetes mellitus (HCC) 11/04/2013  . Anxiety state, unspecified 11/04/2013  . Chronic abdominal pain 11/04/2013  . Essential hypertension, benign 11/04/2013  . Bloating 06/05/2013  . Abdominal pain, unspecified site 06/04/2013    Past Surgical History:  Procedure Laterality Date  . ABDOMINAL HYSTERECTOMY  2003  . APPENDECTOMY  08/21/2017   Procedure: APPENDECTOMY;  Surgeon: Leafy Ro, MD;  Location: [facility] ORS;  Service: General;;  . BOWEL RESECTION  08/21/2017   Procedure: SMALL BOWEL RESECTION WITH PRIMARY ANASTOMOSIS;  Surgeon: Leafy Ro, MD;  Location: [facility] ORS;  Service: General;;  . BREAST REDUCTION SURGERY Bilateral 1993  . BREAST SURGERY    . CESAREAN SECTION  2003  . COLONOSCOPY    . ECTOPIC PREGNANCY SURGERY     tubal removel  . ESOPHAGOGASTRODUODENOSCOPY    . EXPLORATORY LAPAROTOMY    . LAPAROSCOPIC LYSIS OF ADHESIONS N/A 08/21/2017   Procedure: LAPAROSCOPIC LYSIS OF ADHESIONS CONVERTED TO OPEN;  Surgeon: Leafy Ro, MD;  Location: [facility] ORS;  Service: General;  Laterality: N/A;  . TONSILLECTOMY    . UTERINE FIBROID SURGERY      Prior to Admission medications   Medication Sig Start Date End Date Taking? Authorizing Provider  aspirin EC 81 MG tablet Take 81 mg by mouth daily.   Yes [provider]  buPROPion (WELLBUTRIN XL) 300 MG 24 hr tablet Take 300 mg by mouth at bedtime.    Yes [provider]  clonazePAM (KLONOPIN) 0.5 MG tablet Take 0.5 mg by mouth at bedtime as needed for anxiety.    Yes [provider]  HYDROcodone-acetaminophen (NORCO/VICODIN) 5-325 MG tablet Take 1 tablet by mouth every 6 (six) hours as needed for moderate pain. 08/23/17  Yes Pabon, Diego F, MD  irbesartan-hydrochlorothiazide (AVALIDE) 150-12.5 MG tablet Take 1 tablet by mouth daily.   Yes [provider]  metFORMIN (GLUCOPHAGE) 500 MG  tablet Take 500 mg by mouth 2 (two) times daily with a meal.  05/11/13  Yes [provider]  docusate sodium (COLACE) 100 MG capsule Take 1 capsule (100 mg total) by mouth 2 (two) times daily. 08/25/17 08/25/18  Rebecka Apley, MD  polyethylene glycol [facility]) packet Take 17 g by mouth 2 (two) times daily. 08/25/17   Rebecka Apley, MD    Allergies Amoxicillin; Strawberry extract; Ibuprofen; and Tetracyclines & related  Family History  Problem Relation Age of Onset  . Diabetes Mother   . Dementia Father   . Dementia Paternal Grandmother   . Pulmonary embolism Brother   . Colon cancer Neg Hx     Social History Social History   Tobacco Use  . Smoking status: Never Smoker  . Smokeless tobacco: Never Used  Substance Use Topics  . Alcohol use: No  . Drug use: No    Review of Systems  Constitutional: No fever/chills Eyes: No visual changes. ENT: No sore throat. Cardiovascular: Denies chest pain. Respiratory: Denies shortness of breath. Gastrointestinal: abdominal pain and constipation with no nausea, no vomiting.  No diarrhea.   Genitourinary: Negative for dysuria. Musculoskeletal: Negative for back pain. Skin: Negative for rash. Neurological: Negative for headaches   ____________________________________________   PHYSICAL EXAM:  VITAL SIGNS: ED Triage Vitals [08/24/17 2126]  Enc Vitals Group     BP (!) 171/89     Pulse Rate 88     Resp 18     Temp 99.4 F (37.4 C)     Temp Source Oral     SpO2 100 %     Weight 170 lb (77.1 kg)     Height  (1.6 m)     Head Circumference      Peak Flow      Pain Score 10     Pain Loc      Pain Edu?      Excl. in GC?     Constitutional: Alert and oriented. Well appearing and in moderate distress. Eyes: Conjunctivae are normal. PERRL. EOMI. Head: Atraumatic. Nose: No congestion/rhinnorhea. Mouth/Throat: Mucous membranes are moist.  Oropharynx non-erythematous. Cardiovascular: Normal rate, regular  rhythm. Grossly normal heart sounds.  Good peripheral circulation. Respiratory: Normal respiratory effort.  No retractions. Lungs CTAB. Gastrointestinal: Soft with some diffuse tenderness to palpation.  Mild distention.  Positive bowel sounds Musculoskeletal: No lower extremity tenderness nor edema.   Neurologic:  Normal speech and language.  Skin:  Skin is warm, dry and intact.  Psychiatric: Mood and affect are normal.   ____________________________________________   LABS (all labs ordered are listed, but only abnormal results are displayed)  Labs Reviewed  CBC - Abnormal; Notable for the following components:      Result Value   Hemoglobin 9.8 (*)    HCT 30.0 (*)    MCV 78.1 (*)    MCH 25.4 (*)    RDW 14.9 (*)  All other components within normal limits  BASIC METABOLIC PANEL - Abnormal; Notable for the following components:   Glucose, Bld 103 (*)    Creatinine, Ser 1.05 (*)    Calcium 8.8 (*)    GFR calc non Af Amer 59 (*)    All other components within normal limits  HEPATIC FUNCTION PANEL - Abnormal; Notable for the following components:   Albumin 3.2 (*)    All other components within normal limits  LIPASE, BLOOD   ____________________________________________  EKG  none ____________________________________________  RADIOLOGY  ED MD interpretation:  CT abd and pelvis: Status post resection at the distal ileum at the right lower quadrant with mild associated wall thickening and mesenteric edema, mild focal fecal is Asian at the site of anastomosis, mild infection cannot be excluded, fistulization reflects underlying dysmotility, no definite evidence for bowel obstruction at this time.  Official radiology report(s): Ct Abdomen Pelvis W Contrast  Result Date: 08/24/2017 CLINICAL DATA:  Status post bowel resection, with generalized abdominal distention. EXAM: CT ABDOMEN AND PELVIS WITH CONTRAST TECHNIQUE: Multidetector CT imaging of the abdomen and pelvis was  performed using the standard protocol following bolus administration of intravenous contrast. CONTRAST:  ISOVUE-300 IOPAMIDOL (ISOVUE-300) INJECTION 61%, 30mL ISOVUE-300 IOPAMIDOL (ISOVUE-300) INJECTION 61% COMPARISON:  CT of the abdomen and pelvis from 06/21/2017, and abdominal radiograph performed 06/28/2017 FINDINGS: Lower chest: Mild right basilar atelectasis is noted. The visualized portions of the mediastinum are unremarkable. Hepatobiliary: The liver is unremarkable in appearance. The gallbladder is unremarkable in appearance. The common bile duct remains normal in caliber. Pancreas: The pancreas is within normal limits. Spleen: The spleen is unremarkable in appearance. Adrenals/Urinary Tract: The adrenal glands are unremarkable in appearance. The kidneys are within normal limits. There is no evidence of hydronephrosis. No renal or ureteral stones are identified. No perinephric stranding is seen. Stomach/Bowel: The patient is status post resection at the distal ileum at the right lower quadrant, with mild associated wall thickening and mesenteric edema, and mild focal fecalization at the site of anastomosis. Mild infection cannot be excluded. Fecalization reflects underlying dysmotility. No definite bowel obstruction is seen. The colon is unremarkable in appearance. The patient is status post appendectomy. The stomach is partially filled with contrast and is unremarkable in appearance. Vascular/Lymphatic: Scattered calcification is seen along the abdominal aorta and its branches. The abdominal aorta is otherwise grossly unremarkable. The inferior vena cava is grossly unremarkable. No retroperitoneal lymphadenopathy is seen. No pelvic sidewall lymphadenopathy is identified. Reproductive: The bladder is mildly distended and grossly unremarkable. The patient is status post hysterectomy. The ovaries are relatively symmetric. No suspicious adnexal masses are seen. Trace free fluid is noted within the pelvis.  Other: No additional soft tissue abnormalities are seen. Musculoskeletal: No acute osseous abnormalities are identified. The visualized musculature is unremarkable in appearance. IMPRESSION: 1. Status post resection at the distal ileum at the right lower quadrant, with mild associated wall thickening and mesenteric edema, and mild focal fecalization at the site of anastomosis. Mild infection cannot be excluded. Fecalization reflects underlying dysmotility. No definite evidence for bowel obstruction at this time. 2. Mild right basilar atelectasis noted. Aortic Atherosclerosis (ICD10-I70.0). Electronically Signed   By: Roanna Raider M.D.   On: 08/24/2017 22:58    ____________________________________________   PROCEDURES  Procedure(s) performed: None  Procedures  Critical Care performed: No  ____________________________________________   INITIAL IMPRESSION / ASSESSMENT AND PLAN / ED COURSE  As part of my medical decision making, I reviewed the following data within  the electronic MEDICAL RECORD NUMBER Notes from prior ED visits and [place] Controlled Substance Database   This is a 57 year old female who comes into the hospital today with some abdominal pain and distention status post surgery.  My differential diagnosis includes bowel obstruction, ileus, infection, constipation.  We did check some blood work on the patient which was fairly unremarkable.  The patient also had a CT which did not show any obstruction.  I feel the patient's symptoms may be due to constipation.  I did contact Dr. Michela Pitcher with the recommendation of sending the patient home with a good bowel regimen if that may help.  Dr. Michela Pitcher did state that they would be willing to admit the patient if it were her request but he also felt that she could be discharged home with a good bowel regimen.  The patient did receive a liter of normal saline and some morphine and Zofran for pain.  I did discuss this with the patient and she reports that she  would feel more comfortable at home.  Once the patient's fluids are finished she will be discharged home.       ____________________________________________   FINAL CLINICAL IMPRESSION(S) / ED DIAGNOSES  Final diagnoses:  Post-operative pain  Other constipation  Generalized abdominal pain     ED Discharge Orders        Ordered    polyethylene glycol (MIRALAX) packet  2 times daily     08/25/17 0051    docusate sodium (COLACE) 100 MG capsule  2 times daily     08/25/17 0051       Note:  This document was prepared using Dragon voice recognition software and may include unintentional dictation errors.    Rebecka Apley, MD 08/25/17 [phone]

## 2017-08-26 ENCOUNTER — Telehealth: Payer: Self-pay | Admitting: General Surgery

## 2017-08-26 NOTE — Telephone Encounter (Signed)
Patient called continue with pain. Patient came yesterday night to the ED for evaluation and CT scan is negative for any post operative complication. Patient found with significant constipation. It was recommended to give constipation management and discharged. Today called again with pain. Patient has not passed a bowel movement since at least 5 days ago. Patient was given the option of coming back for admission for pain control and constipation management or to try an enema at home. Patient refers prefer to try the enema at home first. Oriented to call back if she does not feel any better.

## 2017-08-27 ENCOUNTER — Other Ambulatory Visit: Payer: Self-pay

## 2017-08-27 ENCOUNTER — Encounter: Payer: Self-pay | Admitting: Emergency Medicine

## 2017-08-27 ENCOUNTER — Emergency Department: Payer: Federal, State, Local not specified - PPO

## 2017-08-27 ENCOUNTER — Telehealth: Payer: Self-pay | Admitting: Surgery

## 2017-08-27 ENCOUNTER — Emergency Department
Admission: EM | Admit: 2017-08-27 | Discharge: 2017-08-27 | Disposition: A | Discharge status: Home / Self Care | Payer: Federal, State, Local not specified - PPO | Attending: Emergency Medicine | Admitting: Emergency Medicine

## 2017-08-27 DIAGNOSIS — G8918 Other acute postprocedural pain: Secondary | ICD-10-CM | POA: Insufficient documentation

## 2017-08-27 DIAGNOSIS — I10 Essential (primary) hypertension: Secondary | ICD-10-CM | POA: Insufficient documentation

## 2017-08-27 DIAGNOSIS — R1084 Generalized abdominal pain: Secondary | ICD-10-CM | POA: Diagnosis not present

## 2017-08-27 DIAGNOSIS — E119 Type 2 diabetes mellitus without complications: Secondary | ICD-10-CM | POA: Insufficient documentation

## 2017-08-27 DIAGNOSIS — K567 Ileus, unspecified: Secondary | ICD-10-CM | POA: Diagnosis not present

## 2017-08-27 DIAGNOSIS — R109 Unspecified abdominal pain: Secondary | ICD-10-CM | POA: Diagnosis not present

## 2017-08-27 LAB — CBC
HEMATOCRIT: 31.7 % — AB (ref 35.0–47.0)
HEMOGLOBIN: 10.3 g/dL — AB (ref 12.0–16.0)
MCH: 25.5 pg — AB (ref 26.0–34.0)
MCHC: 32.5 g/dL (ref 32.0–36.0)
MCV: 78.4 fL — AB (ref 80.0–100.0)
Platelets: 437 10*3/uL (ref 150–440)
RBC: 4.04 MIL/uL (ref 3.80–5.20)
RDW: 15.1 % — ABNORMAL HIGH (ref 11.5–14.5)
WBC: 8.7 10*3/uL (ref 3.6–11.0)

## 2017-08-27 LAB — COMPREHENSIVE METABOLIC PANEL
ALBUMIN: 3.3 g/dL — AB (ref 3.5–5.0)
ALT: 53 U/L (ref 14–54)
ANION GAP: 11 (ref 5–15)
AST: 40 U/L (ref 15–41)
Alkaline Phosphatase: 151 U/L — ABNORMAL HIGH (ref 38–126)
BUN: 13 mg/dL (ref 6–20)
CO2: 26 mmol/L (ref 22–32)
Calcium: 9.1 mg/dL (ref 8.9–10.3)
Chloride: 99 mmol/L — ABNORMAL LOW (ref 101–111)
Creatinine, Ser: 0.82 mg/dL (ref 0.44–1.00)
GFR calc Af Amer: >60 mL/min (ref >60)
GFR calc non Af Amer: >60 mL/min (ref >60)
GLUCOSE: 112 mg/dL — AB (ref 65–99)
Potassium: 3.6 mmol/L (ref 3.5–5.1)
SODIUM: 136 mmol/L (ref 135–145)
Total Bilirubin: 0.9 mg/dL (ref 0.3–1.2)
Total Protein: 7.2 g/dL (ref 6.5–8.1)

## 2017-08-27 LAB — URINALYSIS, COMPLETE (UACMP) WITH MICROSCOPIC
BACTERIA UA: NONE SEEN
Bilirubin Urine: NEGATIVE
Glucose, UA: NEGATIVE mg/dL
Ketones, ur: 20 mg/dL — AB
Nitrite: NEGATIVE
PROTEIN: NEGATIVE mg/dL
Specific Gravity, Urine: 1.02 (ref 1.005–1.030)
pH: 5 (ref 5.0–8.0)

## 2017-08-27 LAB — LIPASE, BLOOD: Lipase: 19 U/L (ref 11–51)

## 2017-08-27 MED ORDER — PEG 3350-KCL-NA BICARB-NACL 420 G PO SOLR: 4000.0000 mL | Freq: Once | ORAL | 0 refills | Status: AC

## 2017-08-27 MED ORDER — MORPHINE SULFATE (PF) 4 MG/ML IV SOLN
4.0000 mg | Freq: Once | INTRAVENOUS | Status: AC
  Administered 2017-08-27: 4 mg via INTRAVENOUS
  Filled 2017-08-27: qty 1

## 2017-08-27 MED ORDER — SODIUM CHLORIDE 0.9 % IV SOLN
Freq: Once | INTRAVENOUS | Status: AC
Start: 2017-08-27 — End: 2017-08-27
  Administered 2017-08-27: 14:00:00 via INTRAVENOUS

## 2017-08-27 MED ORDER — OXYCODONE-ACETAMINOPHEN 7.5-325 MG PO TABS: 1.0000 | ORAL_TABLET | ORAL | 0 refills | Status: DC | PRN

## 2017-08-27 NOTE — Telephone Encounter (Signed)
Patient has called this morning to see if she could make an appointment following her ED visit S/P laparoscopic lysis of adhesion, small bowel resection with primary anastomosis and appendectomy. She complains of generalized abdominal pain, nausea for 2 days, no fever, no vomiting. Patient states that she has not had a bowel movement since surgery on 08/21/17. She has been taking miralax and 2 enemas without any bowel movement. Patient was advised to go back to the emergency room due to the constant pain and no BM. She agree's. She states that she will go to the ED within the hour.

## 2017-08-27 NOTE — ED Provider Notes (Addendum)
[facility] Emergency Department Provider Note       Time seen: ----------------------------------------- 1:33 PM on 08/27/2017 -----------------------------------------   I have reviewed the triage vital signs and the nursing notes.  HISTORY   Chief Complaint Abdominal Pain and Post-op Problem    HPI [name] is a 56 y.o. female with a history of small bowel obstruction, depression, diabetes, hyperlipidemia and hypertension who presents to the ED for persistent abdominal pain.  Patient had recent bowel resection surgery and lysis of adhesions with appendectomy.  She said persistent abdominal pain is not getting better, was seen in the ER 3 days ago for same.  She denies fevers, chills, states she is passing gas but not moving her bowels at all since before the surgery.  Past Medical History:  Diagnosis Date  . Borderline diabetic   . Bowel obstruction (HCC)   . Complication of anesthesia    aspirated after breast reduction surgery and had n/v with that surgery  . Depression   . Diabetes mellitus without complication (HCC)   . Ectopic pregnancy   . Hypercholesteremia   . Hypertension   . Mild cognitive impairment 07/25/2017  . Sleep disorder     Patient Active Problem List   Diagnosis Date Noted  . S/P small bowel resection 08/21/2017  . Intestinal adhesions with partial obstruction (HCC)   . Mild cognitive impairment 07/25/2017  . Small bowel obstruction (HCC) 06/22/2017  . SBO (small bowel obstruction) (HCC) 11/04/2013  . Partial small bowel obstruction (HCC) 11/04/2013  . Enteritis 11/04/2013  . Diabetes mellitus (HCC) 11/04/2013  . Anxiety state, unspecified 11/04/2013  . Chronic abdominal pain 11/04/2013  . Essential hypertension, benign 11/04/2013  . Bloating 06/05/2013  . Abdominal pain, unspecified site 06/04/2013    Past Surgical History:  Procedure Laterality Date  . ABDOMINAL HYSTERECTOMY  2003  . APPENDECTOMY  08/21/2017   Procedure: APPENDECTOMY;  Surgeon: Leafy Ro, MD;  Location: [facility] ORS;  Service: General;;  . BOWEL RESECTION  08/21/2017   Procedure: SMALL BOWEL RESECTION WITH PRIMARY ANASTOMOSIS;  Surgeon: Leafy Ro, MD;  Location: [facility] ORS;  Service: General;;  . BREAST REDUCTION SURGERY Bilateral 1993  . BREAST SURGERY    . CESAREAN SECTION  2003  . COLONOSCOPY    . ECTOPIC PREGNANCY SURGERY     tubal removel  . ESOPHAGOGASTRODUODENOSCOPY    . EXPLORATORY LAPAROTOMY    . LAPAROSCOPIC LYSIS OF ADHESIONS N/A 08/21/2017   Procedure: LAPAROSCOPIC LYSIS OF ADHESIONS CONVERTED TO OPEN;  Surgeon: Leafy Ro, MD;  Location: [facility] ORS;  Service: General;  Laterality: N/A;  . TONSILLECTOMY    . UTERINE FIBROID SURGERY      Allergies Amoxicillin; Strawberry extract; Ibuprofen; and Tetracyclines & related  Social History Social History   Tobacco Use  . Smoking status: Never Smoker  . Smokeless tobacco: Never Used  Substance Use Topics  . Alcohol use: No  . Drug use: No   Review of Systems Constitutional: Negative for fever. Cardiovascular: Negative for chest pain. Respiratory: Negative for shortness of breath. Gastrointestinal: Positive for abdominal pain, constipation Genitourinary: Negative for dysuria. Musculoskeletal: Negative for back pain. Skin: Negative for rash. Neurological: Negative for headaches, focal weakness or numbness.  All systems negative/normal/unremarkable except as stated in the HPI  ____________________________________________   PHYSICAL EXAM:  VITAL SIGNS: ED Triage Vitals [08/27/17 1014]  Enc Vitals Group     BP (!) 144/84     Pulse Rate 97  Resp 16     Temp 98.4 F (36.9 C)     Temp Source Oral     SpO2 97 %     Weight 170 lb (77.1 kg)     Height  (1.6 m)     Head Circumference      Peak Flow      Pain Score 10     Pain Loc      Pain Edu?      Excl. in GC?    Constitutional: Alert and oriented.  Mild distress Eyes: Conjunctivae  are normal. Normal extraocular movements. ENT   Head: Normocephalic and atraumatic.   Nose: No congestion/rhinnorhea.   Mouth/Throat: Mucous membranes are moist.   Neck: No stridor. Cardiovascular: Normal rate, regular rhythm. No murmurs, rubs, or gallops. Respiratory: Normal respiratory effort without tachypnea nor retractions. Breath sounds are clear and equal bilaterally. No wheezes/rales/rhonchi. Gastrointestinal: Distended but non-focally tender, hypoactive bowel sounds Musculoskeletal: Nontender with normal range of motion in extremities. No lower extremity tenderness nor edema. Neurologic:  Normal speech and language. No gross focal neurologic deficits are appreciated.  Skin:  Skin is warm, dry with laparotomy sites clean dry and intact.  Staples are still in place Psychiatric: Mood and affect are normal. Speech and behavior are normal.  ____________________________________________  ED COURSE:  As part of my medical decision making, I reviewed the following data within the electronic MEDICAL RECORD NUMBER History obtained from family if available, nursing notes, old chart and ekg, as well as notes from prior ED visits. Patient presented for postoperative abdominal pain, we will assess with labs and imaging as indicated at this time.   Procedures ____________________________________________   LABS (pertinent positives/negatives)  Labs Reviewed  COMPREHENSIVE METABOLIC PANEL - Abnormal; Notable for the following components:      Result Value   Chloride 99 (*)    Glucose, Bld 112 (*)    Albumin 3.3 (*)    Alkaline Phosphatase 151 (*)    All other components within normal limits  CBC - Abnormal; Notable for the following components:   Hemoglobin 10.3 (*)    HCT 31.7 (*)    MCV 78.4 (*)    MCH 25.5 (*)    RDW 15.1 (*)    All other components within normal limits  URINALYSIS, COMPLETE (UACMP) WITH MICROSCOPIC - Abnormal; Notable for the following components:   Color,  Urine YELLOW (*)    APPearance CLEAR (*)    Hgb urine dipstick MODERATE (*)    Ketones, ur 20 (*)    Leukocytes, UA TRACE (*)    All other components within normal limits  LIPASE, BLOOD    RADIOLOGY Images were viewed by me  Abdomen 2 view Reveals air-fluid levels and constipation ____________________________________________  DIFFERENTIAL DIAGNOSIS   Constipation, ileus, small bowel obstruction, postoperative infection  FINAL ASSESSMENT AND PLAN  Ileus   Plan: The patient had presented for postoperative abdominal pain. Patient's labs looks surprisingly normal. Patient's imaging was at least somewhat concerning for ileus, I discussed with general surgery who has recommended TriLyte.  Patient is agreeable to this plan, she is cleared for outpatient follow-up with general surgery.   Ulice Dash, MD   Note: This note was generated in part or whole with voice recognition software. Voice recognition is usually quite accurate but there are transcription errors that can and very often do occur. I apologize for any typographical errors that were not detected and corrected.     Emily Filbert,  MD 08/27/17 1417    Emily Filbert, MD 08/27/17 [phone]

## 2017-08-27 NOTE — ED Triage Notes (Signed)
Recent surgery for bowel resection, appendectomy and lysis of adhesions.  Has had abdomnial pain since.  Not getting better.

## 2017-08-27 NOTE — ED Notes (Signed)
Pt had surgery last Tuesday and since then has had no BM - she is c/o severe generalized abd pain - pt is able to pass gas - c/o nausea - denies vomiting

## 2017-08-27 NOTE — Telephone Encounter (Signed)
thanks

## 2017-08-30 ENCOUNTER — Ambulatory Visit (INDEPENDENT_AMBULATORY_CARE_PROVIDER_SITE_OTHER): Payer: Federal, State, Local not specified - PPO | Admitting: Surgery

## 2017-08-30 ENCOUNTER — Encounter: Payer: Self-pay | Admitting: Surgery

## 2017-08-30 VITALS — BP 126/77 | HR 86 | Temp 98.7°F | Wt 168.0 lb

## 2017-08-30 DIAGNOSIS — Z09 Encounter for follow-up examination after completed treatment for conditions other than malignant neoplasm: Secondary | ICD-10-CM

## 2017-08-30 MED ORDER — GABAPENTIN 300 MG PO CAPS: 300.0000 mg | ORAL_CAPSULE | Freq: Three times a day (TID) | ORAL | 0 refills | Status: DC

## 2017-08-30 MED ORDER — CYCLOBENZAPRINE HCL 5 MG PO TABS: 5.0000 mg | ORAL_TABLET | Freq: Three times a day (TID) | ORAL | 0 refills | Status: DC | PRN

## 2017-08-30 NOTE — Patient Instructions (Signed)
Please continue taking Ibuprofen 800 MG every 8 hours and Tylenol 500 MG every 6 hours as needed.  Please give [place] a call in case you have any questions or concerns.

## 2017-08-30 NOTE — Progress Notes (Signed)
S/p E lap SB resection 5/14 Having pain issues Recent ER visit did a CT scan that I have personally reviewed.  There is no evidence of an abscess or anastomotic leak.  Her white count and creatinine are normal.  Does have significant constipation. Since she has been doing some MiraLAX and some enemas with some improvement. Does have significant abdominal wall pain and spasms.  She is taking p.o.  No fevers no chills. No emesis She is ambulating well. + flatus, some BM   PE NAD Abd: soft, incisions with a staples in place without infection.  Staples removed.  Appropriate incisional tenderness.  No peritonitis.  No wound infections.  A/P overall doing well but pain and muscle spasms are the main issue.  She does have a previous adverse reactions to NSAIDs.  We will start on gabapentin and Flexeril for adjuvant pain management.  Discussed with the patient about ice as well. No need for any further work-up or emergent surgical intervention at this time.  She will have a follow-up appointment next week

## 2017-09-04 ENCOUNTER — Telehealth: Payer: Self-pay | Admitting: Surgery

## 2017-09-04 DIAGNOSIS — R1084 Generalized abdominal pain: Secondary | ICD-10-CM

## 2017-09-04 NOTE — Telephone Encounter (Signed)
Called patient back and she stated that she continued to have mid abdominal cramping and nausea. However, she stated that even though she had followed Dr. Hurman Horn recommendations, she continues to have the cramping. She wanted to know if Dr. Everlene Farrier could give her more pain medication. I told her that I would have to call her back after consulting with Dr. Everlene Farrier. Patient agreed. I then called Dr. Everlene Farrier and told him what was going on. He recommended for the patient to either go to the ED or have a STAT CT Scan of her abdomen and pelvis and labs. He stated that he wanted to make sure that we are not missing anything instead of masquerading the pain. I called patient back to let her know what Dr. Hurman Horn recommendations were and she stated that she rather go tomorrow and have her CT Scan and labs. However, she stated that if she continued to have the pain, then she would go to the ED later on tonight. Patient had no further questions.

## 2017-09-04 NOTE — Telephone Encounter (Signed)
Patient is calling, said she is still having some pain, said the medicine last for about an hour. Pain level is at a ten. Patient said she started to go to the ER this weekend but didn't end up going due to the pain. Please call patient and advise.

## 2017-09-05 ENCOUNTER — Ambulatory Visit
Admission: RE | Admit: 2017-09-05 | Discharge: 2017-09-05 | Disposition: A | Discharge status: Home / Self Care | Payer: Federal, State, Local not specified - PPO | Source: Ambulatory Visit | Attending: Surgery | Admitting: Surgery

## 2017-09-05 DIAGNOSIS — Z9049 Acquired absence of other specified parts of digestive tract: Secondary | ICD-10-CM | POA: Insufficient documentation

## 2017-09-05 DIAGNOSIS — I700 Atherosclerosis of aorta: Secondary | ICD-10-CM | POA: Diagnosis not present

## 2017-09-05 DIAGNOSIS — R1084 Generalized abdominal pain: Secondary | ICD-10-CM | POA: Diagnosis not present

## 2017-09-05 DIAGNOSIS — Z9889 Other specified postprocedural states: Secondary | ICD-10-CM | POA: Insufficient documentation

## 2017-09-05 DIAGNOSIS — R109 Unspecified abdominal pain: Secondary | ICD-10-CM | POA: Diagnosis not present

## 2017-09-05 DIAGNOSIS — K6389 Other specified diseases of intestine: Secondary | ICD-10-CM | POA: Diagnosis not present

## 2017-09-05 MED ORDER — IOPAMIDOL (ISOVUE-300) INJECTION 61%
100.0000 mL | Freq: Once | INTRAVENOUS | Status: AC | PRN
  Administered 2017-09-05: 100 mL via INTRAVENOUS

## 2017-09-07 ENCOUNTER — Encounter: Payer: Self-pay | Admitting: Surgery

## 2017-09-07 ENCOUNTER — Ambulatory Visit (INDEPENDENT_AMBULATORY_CARE_PROVIDER_SITE_OTHER): Payer: Federal, State, Local not specified - PPO | Admitting: Surgery

## 2017-09-07 VITALS — BP 124/87 | HR 83 | Temp 98.1°F | Ht 63.0 in | Wt 168.6 lb

## 2017-09-07 DIAGNOSIS — R109 Unspecified abdominal pain: Secondary | ICD-10-CM

## 2017-09-07 DIAGNOSIS — Z4889 Encounter for other specified surgical aftercare: Secondary | ICD-10-CM

## 2017-09-07 DIAGNOSIS — G8929 Other chronic pain: Secondary | ICD-10-CM

## 2017-09-07 DIAGNOSIS — K5900 Constipation, unspecified: Secondary | ICD-10-CM

## 2017-09-07 NOTE — Patient Instructions (Signed)
Please take Colace daily.  We removed all staples today.   Please call our office if you have questions or concerns.

## 2017-09-07 NOTE — Progress Notes (Signed)
Surgical Clinic Progress/Follow-up Note   HPI:  57 y.o. Female presents to clinic for subsequent post-op follow-up 2 weeks s/p laparoscopy converted to laparotomy with lysis of adhesions, small bowel resection, and appendectomy (Pabon, 08/21/2017). Unlike the pain of which she complained at her first follow-up appointment that appears to have been associated with constipation, patient now reports she feels "much better" and has been tolerating regular diet with +flatus and normalizing BM's, denies N/V, fever/chills, CP, or SOB.  Review of Systems:  Constitutional: denies fever/chills  Respiratory: denies shortness of breath, wheezing  Cardiovascular: denies chest pain, palpitations  Gastrointestinal: abdominal pain, N/V, and bowel function as per interval history Skin: Denies any other rashes or skin discolorations except post-surgical wounds as per interval history  Vital Signs:  BP 124/87   Pulse 83   Temp 98.1 F (36.7 C) (Oral)   Ht 5\' 3"  (1.6 m)   Wt 168 lb 9.6 oz (76.5 kg)   BMI 29.87 kg/m    Physical Exam:  Constitutional:  -- Normal body habitus  -- Awake, alert, and oriented x3  Pulmonary:  -- No crackles -- Equal breath sounds bilaterally -- Breathing non-labored at rest Cardiovascular:  -- S1, S2 present  -- No pericardial rubs  Gastrointestinal:  -- Soft and non-distended, non-tender to palpation, no guarding/rebound tenderness -- Post-surgical incisions all well-approximated without any peri-incisional erythema or drainage -- No abdominal masses appreciated, pulsatile or otherwise  Musculoskeletal / Integumentary:  -- Wounds or skin discoloration: None appreciated except post-surgical incisions as described above (GI) -- Extremities: B/L UE and LE FROM, hands and feet warm, no edema   Laboratory studies:  CBC Latest Ref Rng & Units 08/27/2017 08/24/2017 08/22/2017  WBC 3.6 - 11.0 K/uL 8.7 10.2 9.5  Hemoglobin 12.0 - 16.0 g/dL 10.3(L) 9.8(L) 9.3(L)  Hematocrit  35.0 - 47.0 % 31.7(L) 30.0(L) 28.4(L)  Platelets 150 - 440 K/uL 437 347 243   CMP Latest Ref Rng & Units 08/27/2017 08/24/2017 08/23/2017  Glucose 65 - 99 mg/dL 782(N) 562(Z) 93  BUN 6 - 20 mg/dL 13 10 11   Creatinine 0.44 - 1.00 mg/dL 3.08 6.57(Q) 4.69  Sodium 135 - 145 mmol/L 136 138 141  Potassium 3.5 - 5.1 mmol/L 3.6 3.7 3.8  Chloride 101 - 111 mmol/L 99(L) 102 111  CO2 22 - 32 mmol/L 26 27 26   Calcium 8.9 - 10.3 mg/dL 9.1 6.2(X) 7.9(L)  Total Protein 6.5 - 8.1 g/dL 7.2 6.8 -  Total Bilirubin 0.3 - 1.2 mg/dL 0.9 0.9 -  Alkaline Phos 38 - 126 U/L 151(H) 85 -  AST 15 - 41 U/L 40 24 -  ALT 14 - 54 U/L 53 21 -    Imaging:  CT Abdomen and Pelvis with Contrast (09/05/2017) The stomach appears normal. Status post appendectomy and ileal resection. No large bowel dilatation is noted. Stool is noted throughout the colon. Mild small bowel dilatation is noted with fecalization of distal small bowel loops suggesting Dysmotility. No other changes since recent study.   Assessment:  57 y.o. yo Female with a problem list including...  Patient Active Problem List   Diagnosis Date Noted  . S/P small bowel resection 08/21/2017  . Intestinal adhesions with partial obstruction (HCC)   . Mild cognitive impairment 07/25/2017  . Small bowel obstruction (HCC) 06/22/2017  . SBO (small bowel obstruction) (HCC) 11/04/2013  . Partial small bowel obstruction (HCC) 11/04/2013  . Enteritis 11/04/2013  . Diabetes mellitus (HCC) 11/04/2013  . Anxiety state, unspecified 11/04/2013  .  Chronic abdominal pain 11/04/2013  . Essential hypertension, benign 11/04/2013  . Bloating 06/05/2013  . Abdominal pain, unspecified site 06/04/2013    presents to clinic for subsequent post-op follow-up evaluation, doing much better following bowel regimen for constipation 2 weeks s/p laparoscopy converted to laparotomy with lysis of adhesions, small bowel resection, and appendectomy (Pabon, 08/21/2017).  Plan:   - results  of CT discussed             - continue to advance diet as tolerated             - okay to submerge incisions under water (baths, swimming) prn  - bowel regimen discussed including water, fiber, and once daily Colace             - gradually resume all activities, but no heavy lifting >40 lbs x 4 more weeks             - apply sunblock particularly to incisions with sun exposure to reduce pigmentation of scars             - return to clinic as needed, instructed to call office if any questions or concerns  All of the above recommendations were discussed with the patient, and all of patient's questions were answered to her expressed satisfaction.  -- Scherrie GerlachJason E. Earlene Plateravis, MD, RPVI [place]: [facility] General Surgery - Partnering for exceptional care. Office: [phone]

## 2017-09-11 ENCOUNTER — Ambulatory Visit (INDEPENDENT_AMBULATORY_CARE_PROVIDER_SITE_OTHER): Payer: Federal, State, Local not specified - PPO | Admitting: Surgery

## 2017-09-11 ENCOUNTER — Encounter: Payer: Self-pay | Admitting: Surgery

## 2017-09-11 VITALS — BP 134/81 | HR 89 | Temp 98.8°F | Ht 63.0 in | Wt 168.0 lb

## 2017-09-11 DIAGNOSIS — Z09 Encounter for follow-up examination after completed treatment for conditions other than malignant neoplasm: Secondary | ICD-10-CM

## 2017-09-11 NOTE — Patient Instructions (Signed)
Please call our office if you have questions or concerns.   

## 2017-09-11 NOTE — Progress Notes (Signed)
S/p E lap SB resection 5/14 Feeling much better and no issues today pain has completely resolved. Recent ER visit did a CT scan that I have personally reviewed.  There is no evidence of an abscess or anastomotic leak.   She is taking p.o.  No fevers no chills. No emesis She is ambulating well. + flatus, some BM   PE NAD Abd: soft, incision healing well there is very small opening in the lower incision with a gap of 5 mm or so.  No drainage.  No infection. No peritonitis.  No wound infections.  A/P  S/P  post bowel resection for adhesive disease. He is doing much better and her symptoms have completely resolved.  She had 2 separate CT scans without evidence of complications abscess or anastomotic leak. He is very appreciative and there is no surgical issues at this time. RTC prn

## 2017-09-27 ENCOUNTER — Telehealth: Payer: Self-pay | Admitting: Surgery

## 2017-09-27 MED ORDER — CYCLOBENZAPRINE HCL 5 MG PO TABS: 5.0000 mg | ORAL_TABLET | Freq: Three times a day (TID) | ORAL | 0 refills | Status: DC | PRN

## 2017-09-27 NOTE — Telephone Encounter (Signed)
Called patient but had to leave her a detailed message letting her know that Dr. Everlene FarrierPabon agreed on given her a refill on her Cyclobenzaprine. I told her to pick it up at her [facility].

## 2017-09-27 NOTE — Telephone Encounter (Signed)
Patient is calling said she goes back to work on Monday and is asking if she could get a refill on her cyclobenzaprine 5mg , due to her having muscle spasms. Please call patient and advise.

## 2017-10-01 MED FILL — CYCLOBENZAPRINE 5 MG TABLET: 5 | 10 days supply | Qty: 30 | Fill #0

## 2017-11-09 DIAGNOSIS — R300 Dysuria: Secondary | ICD-10-CM | POA: Diagnosis not present

## 2017-12-28 DIAGNOSIS — E119 Type 2 diabetes mellitus without complications: Secondary | ICD-10-CM | POA: Diagnosis not present

## 2017-12-28 DIAGNOSIS — I10 Essential (primary) hypertension: Secondary | ICD-10-CM | POA: Diagnosis not present

## 2017-12-28 DIAGNOSIS — E1165 Type 2 diabetes mellitus with hyperglycemia: Secondary | ICD-10-CM | POA: Diagnosis not present

## 2017-12-28 DIAGNOSIS — E7800 Pure hypercholesterolemia, unspecified: Secondary | ICD-10-CM | POA: Diagnosis not present

## 2018-02-01 ENCOUNTER — Ambulatory Visit: Payer: Federal, State, Local not specified - PPO | Admitting: Nurse Practitioner

## 2018-02-12 DIAGNOSIS — E119 Type 2 diabetes mellitus without complications: Secondary | ICD-10-CM | POA: Diagnosis not present

## 2018-05-07 DIAGNOSIS — I10 Essential (primary) hypertension: Secondary | ICD-10-CM | POA: Diagnosis not present

## 2018-05-07 DIAGNOSIS — E1169 Type 2 diabetes mellitus with other specified complication: Secondary | ICD-10-CM | POA: Diagnosis not present

## 2018-05-07 DIAGNOSIS — R42 Dizziness and giddiness: Secondary | ICD-10-CM | POA: Diagnosis not present

## 2018-05-07 DIAGNOSIS — Z111 Encounter for screening for respiratory tuberculosis: Secondary | ICD-10-CM | POA: Diagnosis not present

## 2018-05-07 DIAGNOSIS — E7800 Pure hypercholesterolemia, unspecified: Secondary | ICD-10-CM | POA: Diagnosis not present

## 2018-10-07 DIAGNOSIS — Z01419 Encounter for gynecological examination (general) (routine) without abnormal findings: Secondary | ICD-10-CM | POA: Diagnosis not present

## 2018-10-07 DIAGNOSIS — R319 Hematuria, unspecified: Secondary | ICD-10-CM | POA: Diagnosis not present

## 2018-10-07 DIAGNOSIS — Z6828 Body mass index (BMI) 28.0-28.9, adult: Secondary | ICD-10-CM | POA: Diagnosis not present

## 2018-10-07 DIAGNOSIS — Z1231 Encounter for screening mammogram for malignant neoplasm of breast: Secondary | ICD-10-CM | POA: Diagnosis not present

## 2018-10-30 DIAGNOSIS — L818 Other specified disorders of pigmentation: Secondary | ICD-10-CM | POA: Diagnosis not present

## 2018-12-20 ENCOUNTER — Other Ambulatory Visit: Payer: Self-pay | Admitting: *Deleted

## 2018-12-20 DIAGNOSIS — R6889 Other general symptoms and signs: Secondary | ICD-10-CM | POA: Diagnosis not present

## 2018-12-20 DIAGNOSIS — Z20822 Contact with and (suspected) exposure to covid-19: Secondary | ICD-10-CM

## 2018-12-22 LAB — NOVEL CORONAVIRUS, NAA: SARS-CoV-2, NAA: NOT DETECTED

## 2019-01-09 DIAGNOSIS — I10 Essential (primary) hypertension: Secondary | ICD-10-CM | POA: Diagnosis not present

## 2019-01-09 DIAGNOSIS — E7800 Pure hypercholesterolemia, unspecified: Secondary | ICD-10-CM | POA: Diagnosis not present

## 2019-01-09 DIAGNOSIS — Z23 Encounter for immunization: Secondary | ICD-10-CM | POA: Diagnosis not present

## 2019-01-09 DIAGNOSIS — E1169 Type 2 diabetes mellitus with other specified complication: Secondary | ICD-10-CM | POA: Diagnosis not present

## 2019-01-09 DIAGNOSIS — Z0000 Encounter for general adult medical examination without abnormal findings: Secondary | ICD-10-CM | POA: Diagnosis not present

## 2019-01-13 DIAGNOSIS — E119 Type 2 diabetes mellitus without complications: Secondary | ICD-10-CM | POA: Diagnosis not present

## 2019-02-10 DIAGNOSIS — R3915 Urgency of urination: Secondary | ICD-10-CM | POA: Diagnosis not present

## 2019-02-10 DIAGNOSIS — R3121 Asymptomatic microscopic hematuria: Secondary | ICD-10-CM | POA: Diagnosis not present

## 2019-02-28 DIAGNOSIS — R3121 Asymptomatic microscopic hematuria: Secondary | ICD-10-CM | POA: Diagnosis not present

## 2019-02-28 DIAGNOSIS — R3129 Other microscopic hematuria: Secondary | ICD-10-CM | POA: Diagnosis not present

## 2019-03-04 DIAGNOSIS — R3121 Asymptomatic microscopic hematuria: Secondary | ICD-10-CM | POA: Diagnosis not present

## 2019-03-04 DIAGNOSIS — R3915 Urgency of urination: Secondary | ICD-10-CM | POA: Diagnosis not present

## 2019-05-13 DIAGNOSIS — Z111 Encounter for screening for respiratory tuberculosis: Secondary | ICD-10-CM | POA: Diagnosis not present

## 2019-07-09 DIAGNOSIS — E1169 Type 2 diabetes mellitus with other specified complication: Secondary | ICD-10-CM | POA: Diagnosis not present

## 2019-07-09 DIAGNOSIS — E7800 Pure hypercholesterolemia, unspecified: Secondary | ICD-10-CM | POA: Diagnosis not present

## 2019-07-09 DIAGNOSIS — I10 Essential (primary) hypertension: Secondary | ICD-10-CM | POA: Diagnosis not present

## 2019-07-09 DIAGNOSIS — R413 Other amnesia: Secondary | ICD-10-CM | POA: Diagnosis not present

## 2019-09-29 DIAGNOSIS — K1379 Other lesions of oral mucosa: Secondary | ICD-10-CM | POA: Diagnosis not present

## 2019-11-25 DIAGNOSIS — T7840XA Allergy, unspecified, initial encounter: Secondary | ICD-10-CM | POA: Diagnosis not present

## 2019-11-25 DIAGNOSIS — T783XXA Angioneurotic edema, initial encounter: Secondary | ICD-10-CM | POA: Diagnosis not present

## 2019-12-10 IMAGING — DX DG ABDOMEN 2V
3 series · 3 of 3 positions shown · non-contrast
Comparison: 06/22/2017

CLINICAL DATA: Partial small bowel obstruction

EXAM:
ABDOMEN - 2 VIEW

[dg abd 2 views (1 of 3)]
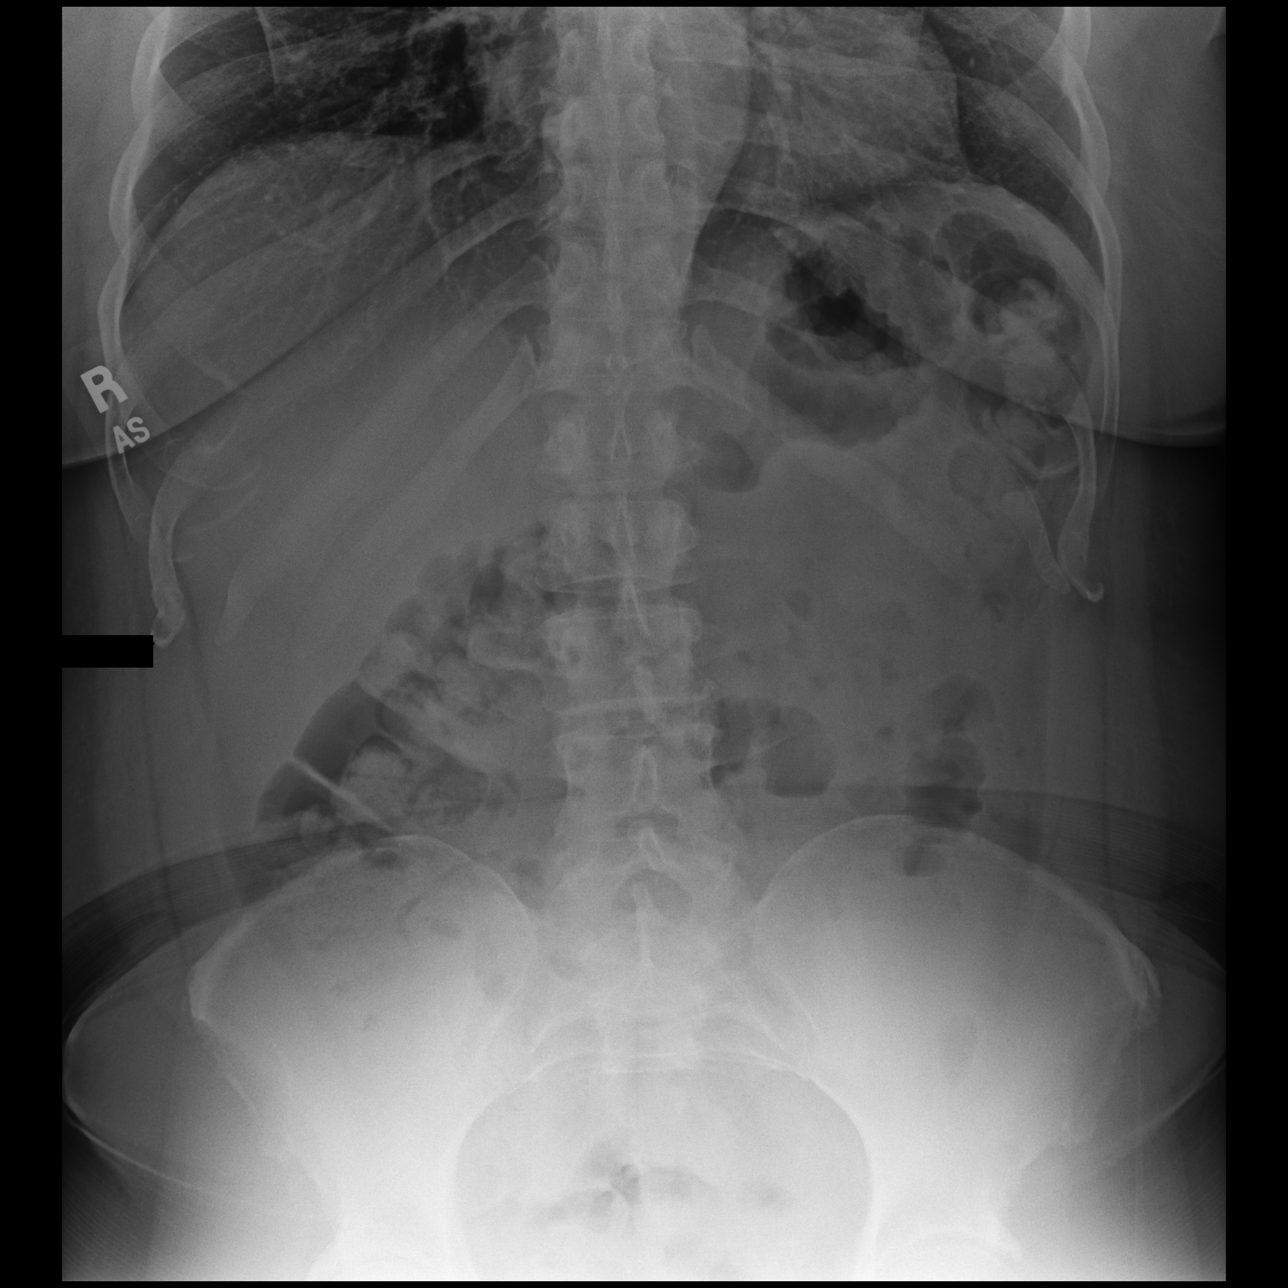

[dg abd 2 views (2 of 3)]
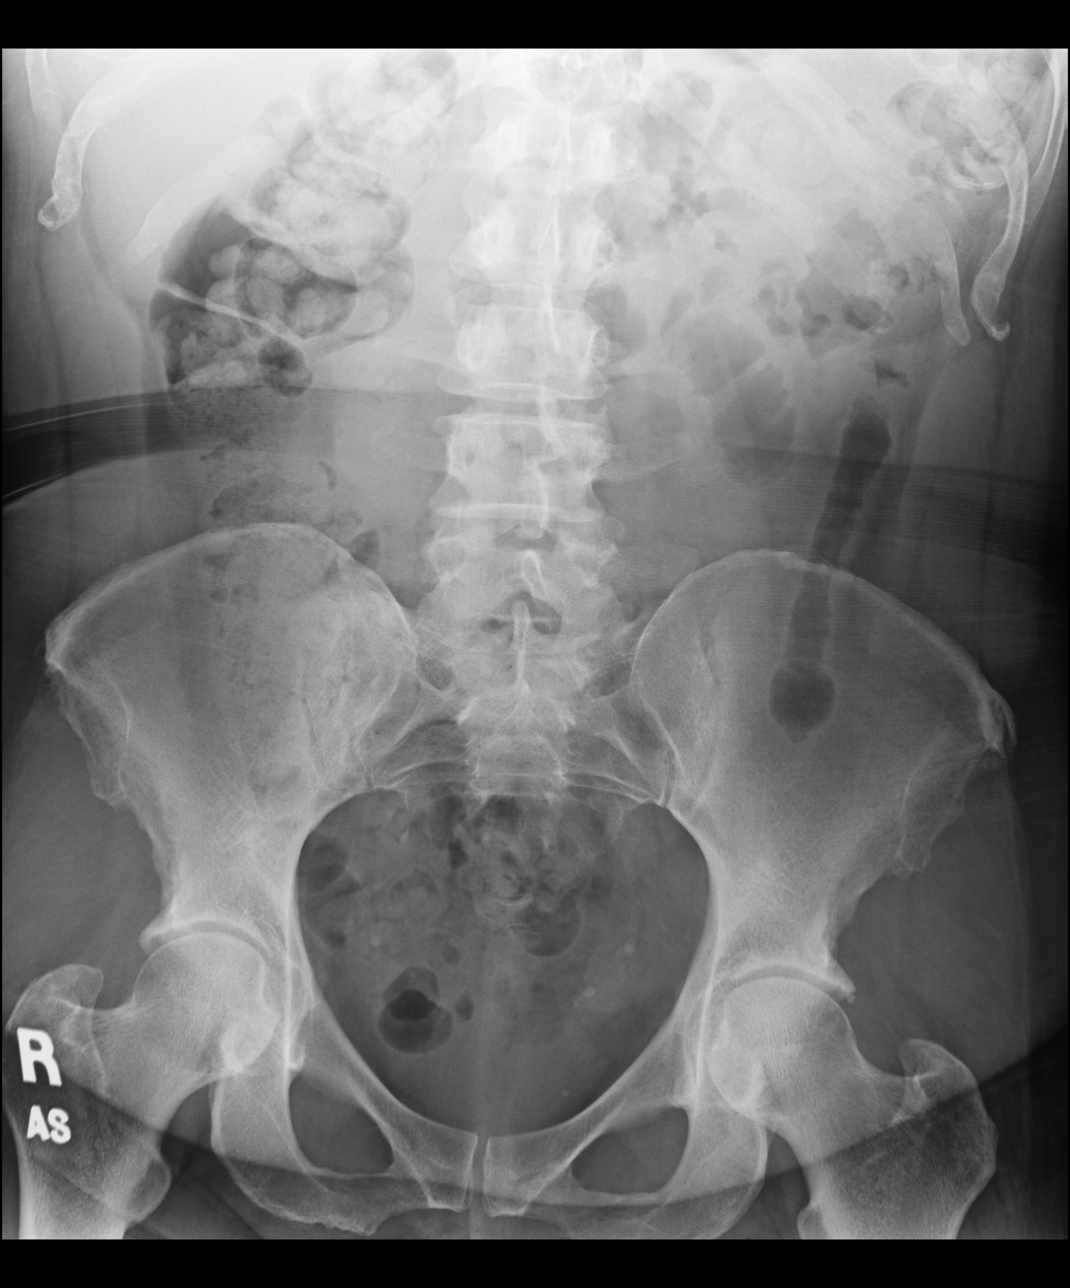

[dg abd 2 views (3 of 3)]
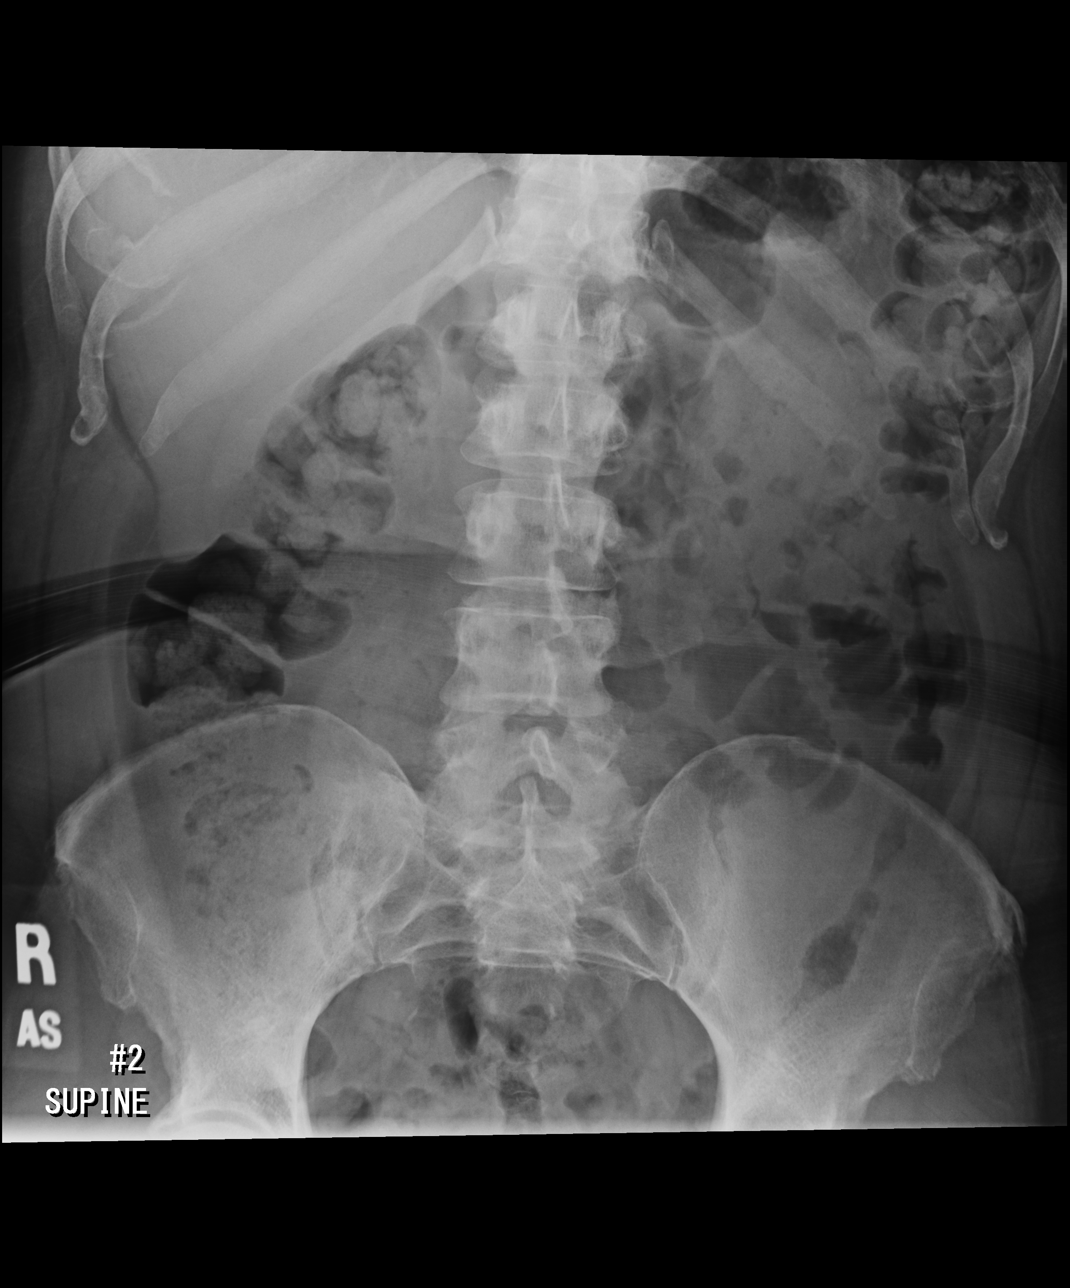

[3 of 3 positions shown; findings below may reference images not displayed]

FINDINGS: The bowel gas pattern is normal. There is no evidence of free air.
No radio-opaque calculi or other significant radiographic
abnormality is seen.
IMPRESSION: Negative.

## 2019-12-23 DIAGNOSIS — Z01419 Encounter for gynecological examination (general) (routine) without abnormal findings: Secondary | ICD-10-CM | POA: Diagnosis not present

## 2019-12-23 DIAGNOSIS — Z1231 Encounter for screening mammogram for malignant neoplasm of breast: Secondary | ICD-10-CM | POA: Diagnosis not present

## 2019-12-26 DIAGNOSIS — K130 Diseases of lips: Secondary | ICD-10-CM | POA: Diagnosis not present

## 2019-12-28 ENCOUNTER — Other Ambulatory Visit: Payer: Self-pay

## 2019-12-28 ENCOUNTER — Encounter: Payer: Self-pay | Admitting: Emergency Medicine

## 2019-12-28 ENCOUNTER — Emergency Department: Payer: Federal, State, Local not specified - PPO

## 2019-12-28 ENCOUNTER — Emergency Department
Admission: EM | Admit: 2019-12-28 | Discharge: 2019-12-28 | Disposition: A | Discharge status: Home / Self Care | Payer: Federal, State, Local not specified - PPO | Attending: Emergency Medicine | Admitting: Emergency Medicine

## 2019-12-28 DIAGNOSIS — R221 Localized swelling, mass and lump, neck: Secondary | ICD-10-CM | POA: Diagnosis not present

## 2019-12-28 DIAGNOSIS — R220 Localized swelling, mass and lump, head: Secondary | ICD-10-CM | POA: Diagnosis not present

## 2019-12-28 DIAGNOSIS — Z7984 Long term (current) use of oral hypoglycemic drugs: Secondary | ICD-10-CM | POA: Diagnosis not present

## 2019-12-28 DIAGNOSIS — Z79899 Other long term (current) drug therapy: Secondary | ICD-10-CM | POA: Diagnosis not present

## 2019-12-28 DIAGNOSIS — I10 Essential (primary) hypertension: Secondary | ICD-10-CM | POA: Diagnosis not present

## 2019-12-28 DIAGNOSIS — K115 Sialolithiasis: Secondary | ICD-10-CM | POA: Diagnosis not present

## 2019-12-28 DIAGNOSIS — E119 Type 2 diabetes mellitus without complications: Secondary | ICD-10-CM | POA: Diagnosis not present

## 2019-12-28 DIAGNOSIS — K1120 Sialoadenitis, unspecified: Secondary | ICD-10-CM

## 2019-12-28 LAB — COMPREHENSIVE METABOLIC PANEL
ALT: 12 U/L (ref 0–44)
AST: 19 U/L (ref 15–41)
Albumin: 3.6 g/dL (ref 3.5–5.0)
Alkaline Phosphatase: 77 U/L (ref 38–126)
Anion gap: 11 (ref 5–15)
BUN: 17 mg/dL (ref 6–20)
CO2: 29 mmol/L (ref 22–32)
Calcium: 9.5 mg/dL (ref 8.9–10.3)
Chloride: 101 mmol/L (ref 98–111)
Creatinine, Ser: 1.25 mg/dL — ABNORMAL HIGH (ref 0.44–1.00)
GFR calc Af Amer: 55 mL/min — ABNORMAL LOW (ref >60)
GFR calc non Af Amer: 48 mL/min — ABNORMAL LOW (ref >60)
Glucose, Bld: 108 mg/dL — ABNORMAL HIGH (ref 70–99)
Potassium: 3.4 mmol/L — ABNORMAL LOW (ref 3.5–5.1)
Sodium: 141 mmol/L (ref 135–145)
Total Bilirubin: 0.9 mg/dL (ref 0.3–1.2)
Total Protein: 7.4 g/dL (ref 6.5–8.1)

## 2019-12-28 LAB — CBC
HCT: 33.3 % — ABNORMAL LOW (ref 36.0–46.0)
Hemoglobin: 10.2 g/dL — ABNORMAL LOW (ref 12.0–15.0)
MCH: 24.9 pg — ABNORMAL LOW (ref 26.0–34.0)
MCHC: 30.6 g/dL (ref 30.0–36.0)
MCV: 81.4 fL (ref 80.0–100.0)
Platelets: 317 10*3/uL (ref 150–400)
RBC: 4.09 MIL/uL (ref 3.87–5.11)
RDW: 14.1 % (ref 11.5–15.5)
WBC: 14 10*3/uL — ABNORMAL HIGH (ref 4.0–10.5)
nRBC: 0 % (ref 0.0–0.2)

## 2019-12-28 MED ORDER — EPINEPHRINE 0.3 MG/0.3ML IJ SOAJ: 0.3000 mg | Freq: Once | INTRAMUSCULAR | Status: AC

## 2019-12-28 MED ORDER — EPINEPHRINE 0.3 MG/0.3ML IJ SOAJ
INTRAMUSCULAR | Status: AC
  Administered 2019-12-28: 0.3 mg via INTRAMUSCULAR
  Filled 2019-12-28: qty 0.3

## 2019-12-28 MED ORDER — FAMOTIDINE IN NACL 20-0.9 MG/50ML-% IV SOLN
20.0000 mg | Freq: Once | INTRAVENOUS | Status: AC
  Administered 2019-12-28: 20 mg via INTRAVENOUS
  Filled 2019-12-28: qty 50

## 2019-12-28 MED ORDER — IOHEXOL 300 MG/ML  SOLN
75.0000 mL | Freq: Once | INTRAMUSCULAR | Status: AC | PRN
  Administered 2019-12-28: 75 mL via INTRAVENOUS

## 2019-12-28 MED ORDER — CLINDAMYCIN HCL 150 MG PO CAPS
300.0000 mg | ORAL_CAPSULE | Freq: Once | ORAL | Status: AC
  Administered 2019-12-28: 300 mg via ORAL
  Filled 2019-12-28: qty 2

## 2019-12-28 MED ORDER — METHYLPREDNISOLONE SODIUM SUCC 125 MG IJ SOLR
125.0000 mg | Freq: Once | INTRAMUSCULAR | Status: AC
  Administered 2019-12-28: 125 mg via INTRAVENOUS
  Filled 2019-12-28: qty 2

## 2019-12-28 MED ORDER — CLINDAMYCIN HCL 300 MG PO CAPS: 300.0000 mg | ORAL_CAPSULE | Freq: Three times a day (TID) | ORAL | 0 refills | Status: AC

## 2019-12-28 NOTE — ED Triage Notes (Signed)
PT arrived via POV with c/o allergic reaction to unknown substance.  PT reports she first noticed some itching to her tongue around 10 am this morning, pt c/o swelling under her tongue and submandibular region.  PT states she took 50mg  Benadryl this morning and then again 1 hour PTA.  PT also states she took 10mg  of Zyrtec.  No respiratory distress. Pt not currently taking any ACEI but does take Amlodipine and Losartan and HCTZ.

## 2019-12-28 NOTE — ED Notes (Signed)
Pt on cardiac, pulse ox, and BP monitoring. Pt states being able to move tongue a little better after Epi.

## 2019-12-28 NOTE — ED Notes (Signed)
Pt states that the swelling to the chin fills like it is getting bigger, but it is easier to speak. ER provider notified and provider now at bedside.

## 2019-12-28 NOTE — ED Provider Notes (Addendum)
[facility] Emergency Department Provider Note  ____________________________________________   First MD Initiated Contact with Patient 12/28/19 [phone]     (approximate)  I have reviewed the triage vital signs and the nursing notes.   HISTORY  Chief Complaint Allergic Reaction    HPI [name] is a 59 y.o. female with below list of previous medical conditions presents to the emergency department secondary to concern for an allergic reaction to an unknown substance.  Patient states that beginning at 10 AM this morning she noted itching to her tongue with swelling to the tongue and the submandibular region.  Patient took 50 mg of Benadryl in the morning and then subsequently again 1 hour before arrival to the emergency department.  Patient does admit to difficulty swallowing however no difficulty breathing.          Past Medical History:  Diagnosis Date  . Borderline diabetic   . Bowel obstruction (HCC)   . Complication of anesthesia    aspirated after breast reduction surgery and had n/v with that surgery  . Depression   . Diabetes mellitus without complication (HCC)   . Ectopic pregnancy   . Enteritis 11/04/2013  . Hypercholesteremia   . Hypertension   . Intestinal adhesions with partial obstruction (HCC)   . Mild cognitive impairment 07/25/2017  . Partial small bowel obstruction (HCC) 11/04/2013  . S/P small bowel resection 08/21/2017  . Sleep disorder   . Small bowel obstruction (HCC) 06/22/2017    Patient Active Problem List   Diagnosis Date Noted  . Mild cognitive impairment 07/25/2017  . Diabetes mellitus (HCC) 11/04/2013  . Anxiety state, unspecified 11/04/2013  . Chronic abdominal pain 11/04/2013  . Essential hypertension, benign 11/04/2013    Past Surgical History:  Procedure Laterality Date  . ABDOMINAL HYSTERECTOMY  2003  . APPENDECTOMY  08/21/2017   Procedure: APPENDECTOMY;  Surgeon: Leafy Ro, MD;  Location: [facility] ORS;   Service: General;;  . BOWEL RESECTION  08/21/2017   Procedure: SMALL BOWEL RESECTION WITH PRIMARY ANASTOMOSIS;  Surgeon: Leafy Ro, MD;  Location: [facility] ORS;  Service: General;;  . BREAST REDUCTION SURGERY Bilateral 1993  . BREAST SURGERY    . CESAREAN SECTION  2003  . COLONOSCOPY    . ECTOPIC PREGNANCY SURGERY     tubal removel  . ESOPHAGOGASTRODUODENOSCOPY    . EXPLORATORY LAPAROTOMY    . LAPAROSCOPIC LYSIS OF ADHESIONS N/A 08/21/2017   Procedure: LAPAROSCOPIC LYSIS OF ADHESIONS CONVERTED TO OPEN;  Surgeon: Leafy Ro, MD;  Location: [facility] ORS;  Service: General;  Laterality: N/A;  . TONSILLECTOMY    . UTERINE FIBROID SURGERY      Prior to Admission medications   Medication Sig Start Date End Date Taking? Authorizing Provider  buPROPion (WELLBUTRIN XL) 300 MG 24 hr tablet Take 300 mg by mouth at bedtime.     [provider]  clindamycin (CLEOCIN) 300 MG capsule Take 1 capsule (300 mg total) by mouth 3 (three) times daily for 10 days. 12/28/19 01/07/20  Darci Current, MD  clonazePAM (KLONOPIN) 0.5 MG tablet Take 0.5 mg by mouth at bedtime as needed for anxiety.     [provider]  cyclobenzaprine (FLEXERIL) 5 MG tablet Take 1 tablet (5 mg total) by mouth 3 (three) times daily as needed for muscle spasms. 09/27/17   Pabon, Diego F, MD  gabapentin (NEURONTIN) 300 MG capsule Take 1 capsule (300 mg total) by mouth 3 (three) times daily. 08/30/17  Pabon, Diego F, MD  HYDROcodone-acetaminophen (NORCO/VICODIN) 5-325 MG tablet Take 1 tablet by mouth every 6 (six) hours as needed for moderate pain. 08/23/17   Pabon, Diego F, MD  irbesartan-hydrochlorothiazide (AVALIDE) 150-12.5 MG tablet Take 1 tablet by mouth daily.    [provider]  metFORMIN (GLUCOPHAGE) 500 MG tablet Take 500 mg by mouth 2 (two) times daily with a meal.  05/11/13   [provider]  polyethylene glycol (MIRALAX) packet Take 17 g by mouth 2 (two) times daily. 08/25/17   Rebecka Apley, MD    Allergies Amoxicillin, Strawberry extract, Tetracycline, Tetracyclines & related, Fluconazole, Ibuprofen, and Other  Family History  Problem Relation Age of Onset  . Diabetes Mother   . Dementia Father   . Dementia Paternal Grandmother   . Pulmonary embolism Brother   . Colon cancer Neg Hx     Social History Social History   Tobacco Use  . Smoking status: Never Smoker  . Smokeless tobacco: Never Used  Vaping Use  . Vaping Use: Never used  Substance Use Topics  . Alcohol use: No  . Drug use: No    Review of Systems Constitutional: No fever/chills Eyes: No visual changes. ENT: No sore throat. Cardiovascular: Denies chest pain. Respiratory: Denies shortness of breath. Gastrointestinal: No abdominal pain.  No nausea, no vomiting.  No diarrhea.  No constipation. Genitourinary: Negative for dysuria. Musculoskeletal: Negative for neck pain.  Negative for back pain. Integumentary: Negative for rash. Neurological: Negative for headaches, focal weakness or numbness.   ____________________________________________   PHYSICAL EXAM:  VITAL SIGNS: ED Triage Vitals  Enc Vitals Group     BP 12/28/19 0107 (!) 160/90     Pulse Rate 12/28/19 0107 88     Resp 12/28/19 0107 18     Temp 12/28/19 0039 98.4 F (36.9 C)     Temp Source 12/28/19 0039 Oral     SpO2 12/28/19 0105 100 %     Weight 12/28/19 0042 80.3 kg (177 lb)     Height 12/28/19 0042 1.588 m (5' 2.5")     Head Circumference --      Peak Flow --      Pain Score 12/28/19 0107 7     Pain Loc --      Pain Edu? --      Excl. in GC? --     Constitutional: Alert and oriented.  Eyes: Conjunctivae are normal.  Head: Atraumatic. Mouth/Throat: Right submandibular swelling that is tender to touch Neck: No stridor.  No meningeal signs.   Cardiovascular: Normal rate, regular rhythm. Good peripheral circulation. Grossly normal heart sounds. Respiratory: Normal respiratory effort.  No  retractions. Gastrointestinal: Soft and nontender. No distention.  Musculoskeletal: No lower extremity tenderness nor edema. No gross deformities of extremities. Neurologic:  Normal speech and language. No gross focal neurologic deficits are appreciated.  Skin:  Skin is warm, dry and intact. Psychiatric: Mood and affect are normal. Speech and behavior are normal.  ____________________________________________   LABS (all labs ordered are listed, but only abnormal results are displayed)  Labs Reviewed  COMPREHENSIVE METABOLIC PANEL - Abnormal; Notable for the following components:      Result Value   Potassium 3.4 (*)    Glucose, Bld 108 (*)    Creatinine, Ser 1.25 (*)    GFR calc non Af Amer 48 (*)    GFR calc Af Amer 55 (*)    All other components within normal limits  CBC - Abnormal; Notable  for the following components:   WBC 14.0 (*)    Hemoglobin 10.2 (*)    HCT 33.3 (*)    MCH 24.9 (*)    All other components within normal limits   _____________  RADIOLOGY I, [place] Hilari Wethington, personally viewed and evaluated these images (plain radiographs) as part of my medical decision making, as well as reviewing the written report by the radiologist.  ED MD interpretation: 2 mm stone in the right submandibular duct with surrounding inflammatory changes consistent with mild sialadenitis per radiologist.  Official radiology report(s): CT Soft Tissue Neck W Contrast  Result Date: 12/28/2019 CLINICAL DATA:  Oral and submandibular swelling EXAM: CT NECK WITH CONTRAST TECHNIQUE: Multidetector CT imaging of the neck was performed using the standard protocol following the bolus administration of intravenous contrast. CONTRAST:  75mL OMNIPAQUE IOHEXOL 300 MG/ML  SOLN COMPARISON:  None. FINDINGS: PHARYNX AND LARYNX: The nasopharynx, oropharynx and larynx are normal. Visible portions of the oral cavity, tongue base and floor of mouth are normal. Normal epiglottis, vallecula and pyriform sinuses.  The larynx is normal. No retropharyngeal abscess, effusion or lymphadenopathy. SALIVARY GLANDS: There is a 2 mm stone within the right submandibular duct. No ductal dilatation. There is mild stranding adjacent to the right submandibular gland. The other salivary glands are normal. THYROID: Normal. LYMPH NODES: No enlarged or abnormal density lymph nodes. VASCULAR: Major cervical vessels are patent. LIMITED INTRACRANIAL: Normal. VISUALIZED ORBITS: Normal. MASTOIDS AND VISUALIZED PARANASAL SINUSES: No fluid levels or advanced mucosal thickening. No mastoid effusion. SKELETON: No bony spinal canal stenosis. No lytic or blastic lesions. UPPER CHEST: Clear. OTHER: None. IMPRESSION: 2 mm stone within the right submandibular duct with mild surrounding inflammatory change, likely mild sialadenitis. Electronically Signed   By: Deatra RobinsonKevin  Herman M.D.   On: 12/28/2019 02:23    _______________________________________  Procedures   ____________________________________________   INITIAL IMPRESSION / MDM / ASSESSMENT AND PLAN / ED COURSE  As part of my medical decision making, I reviewed the following data within the electronic MEDICAL RECORD NUMBER   59 year old female presented with above-stated history and physical exam.  Laboratory data revealed no BC of 14.  CT scan soft tissue was performed to evaluate for possible sialoadenitis which was confirmed on CT as well as a 2 mm sialolithiasis.  Patient afebrile.  Patient given clindamycin secondary to penicillin allergy.  Spoke with the patient at length regarding management at home and warning signs that would warrant immediate return to the emergency department.  Patient referred to Dr. Willeen CassBennett ENT.  ____________________________________________  FINAL CLINICAL IMPRESSION(S) / ED DIAGNOSES  Final diagnoses:  Sialoadenitis of submandibular gland  Sialolithiasis of submandibular gland     MEDICATIONS GIVEN DURING THIS VISIT:  Medications  EPINEPHrine (EPI-PEN)  injection 0.3 mg (0.3 mg Intramuscular Given 12/28/19 0109)  methylPREDNISolone sodium succinate (SOLU-MEDROL) 125 mg/2 mL injection 125 mg (125 mg Intravenous Given 12/28/19 0119)  famotidine (PEPCID) IVPB 20 mg premix (0 mg Intravenous Stopped 12/28/19 0245)  iohexol (OMNIPAQUE) 300 MG/ML solution 75 mL (75 mLs Intravenous Contrast Given 12/28/19 0203)  clindamycin (CLEOCIN) capsule 300 mg (300 mg Oral Given 12/28/19 0340)     ED Discharge Orders         Ordered    clindamycin (CLEOCIN) 300 MG capsule  3 times daily        12/28/19 0359          *Please note:  Averyanna Gorden HarmsK Habel was evaluated in Emergency Department on 12/28/2019 for the symptoms described  in the history of present illness. She was evaluated in the context of the global COVID-19 pandemic, which necessitated consideration that the patient might be at risk for infection with the SARS-CoV-2 virus that causes COVID-19. Institutional protocols and algorithms that pertain to the evaluation of patients at risk for COVID-19 are in a state of rapid change based on information released by regulatory bodies including the CDC and federal and state organizations. These policies and algorithms were followed during the patient's care in the ED.  Some ED evaluations and interventions may be delayed as a result of limited staffing during and after the pandemic.*  Note:  This document was prepared using Dragon voice recognition software and may include unintentional dictation errors.   Darci Current, MD 12/28/19 0501    Darci Current, MD 12/28/19 [phone]

## 2019-12-29 DIAGNOSIS — K1121 Acute sialoadenitis: Secondary | ICD-10-CM | POA: Diagnosis not present

## 2019-12-30 DIAGNOSIS — K1120 Sialoadenitis, unspecified: Secondary | ICD-10-CM | POA: Diagnosis not present

## 2020-01-16 ENCOUNTER — Other Ambulatory Visit: Payer: Federal, State, Local not specified - PPO

## 2020-01-16 DIAGNOSIS — Z20822 Contact with and (suspected) exposure to covid-19: Secondary | ICD-10-CM | POA: Diagnosis not present

## 2020-01-19 LAB — NOVEL CORONAVIRUS, NAA: SARS-CoV-2, NAA: NOT DETECTED

## 2020-01-20 ENCOUNTER — Ambulatory Visit: Payer: Federal, State, Local not specified - PPO | Admitting: Neurology

## 2020-01-20 ENCOUNTER — Encounter: Payer: Self-pay | Admitting: Neurology

## 2020-01-20 VITALS — BP 134/81 | HR 82 | Ht 62.5 in | Wt 175.8 lb

## 2020-01-20 DIAGNOSIS — G3184 Mild cognitive impairment, so stated: Secondary | ICD-10-CM

## 2020-01-20 MED ORDER — DONEPEZIL HCL 10 MG PO TABS: 10.0000 mg | ORAL_TABLET | Freq: Every day | ORAL | 5 refills | Status: DC

## 2020-01-20 NOTE — Progress Notes (Signed)
PATIENT: [name] DOB: 1960-07-08  REASON FOR VISIT: follow up HISTORY FROM: patient  HISTORY OF PRESENT ILLNESS: Today 01/20/20 [name] 59 year old female with history of progressive memory disturbance and depression.  MRI of the brain was normal, no evidence of atrophy or SVD, given a trial off Crestor for 3 months, no change in memory.  Depression is well controlled with Wellbutrin.  Continues to have difficulty with word recall, and finding.  Is a Higher education careers adviser for ID at [employer], does well with presentations if she can practice, but with conversations with coworkers, they have noted hesitancy, she has trouble remembering names.  Her husband reports she misplaces things at home.  She sleeps well, has good energy through the day, no snoring.  Reportedly PCP follows B12, TSH, have been normal.  She just finished her masters degree at [employer], in nursing education, the word recall problems started around 2018, she started her program in 2019.  She has an autistic 46 year old daughter, no more stress than normal in her life, to explain symptoms.  Her father, and paternal grandmother have history of Alzheimer's disease.  Her father is living, in his 20s.  She drives well.  She even stopped Klonopin, no change.  Presents today for evaluation unaccompanied.  HISTORY 07/26/2019 Dr. Anne Hahn: [name] is a 59 year old right-handed black female with a history of borderline diabetes and depression.  She believes that her depression currently is under good control, she has been on Wellbutrin for quite a number of years.  The patient over the last year has noted some problems with word finding, she also has noted some difficulty with remembering names for people.  She has also developed a generalized short-term memory issue over the last 6 months.  The patient reports that she is still working, the memory issues have not prevented her from completing all of her activities of daily living.   She denies issues with keeping up with medications or appointments, she is able to operate a motor vehicle safely and she has no problems with directions.  She denies any issues doing the finances or cooking.  The patient sleeps well at night, she has a good energy level during the day.  She had been on a statin drug, she was taken off of this for 2 weeks but noted no difference in her memory.  She is now on Crestor.  The patient denies any numbness or weakness of the extremities, she denies any balance issues, she has had some recent problems with intestinal obstruction, but she denies any issues controlling the bladder.  She reports that her father and the paternal grandmother had dementia.  She is sent to this office for an evaluation.  REVIEW OF SYSTEMS: Out of a complete 14 system review of symptoms, the patient complains only of the following symptoms, and all other reviewed systems are negative.  Word recall difficulty  ALLERGIES: Allergies  Allergen Reactions  . Amoxicillin Anaphylaxis    Has patient had a PCN reaction causing immediate rash, facial/tongue/throat swelling, SOB or lightheadedness with hypotension: Yes Has patient had a PCN reaction causing severe rash involving mucus membranes or skin necrosis: No Has patient had a PCN reaction that required hospitalization: Yes Has patient had a PCN reaction occurring within the last 10 years: No If all of the above answers are "NO", then may proceed with Cephalosporin use.  Other reaction(s): Other (See Comments) Has patient had a PCN reaction causing immediate rash, facial/tongue/throat swelling, SOB  or lightheadedness with hypotension: Yes Has patient had a PCN reaction causing severe rash involving mucus membranes or skin necrosis: No Has patient had a PCN reaction that required hospitalization: Yes Has patient had a PCN reaction occurring within the last 10 years: No If all of the above answers are "NO", then may proceed with  Cephalosporin use.  . Strawberry Extract Shortness Of Breath, Itching and Swelling    Other reaction(s): Other (See Comments)  . Tetracycline Anaphylaxis  . Tetracyclines & Related Nausea And Vomiting and Rash    Other reaction(s): Other (See Comments)  . Fluconazole Photosensitivity  . Hctz [Hydrochlorothiazide] Swelling    Facial swelling, including lips.   . Ibuprofen Other (See Comments)    Acute renal failure Other reaction(s): Other Acute renal failure Acute renal failure   . Other Nausea And Vomiting    Other reaction(s): Other (See Comments)    HOME MEDICATIONS: Outpatient Medications Prior to Visit  Medication Sig Dispense Refill  . amLODipine (NORVASC) 5 MG tablet Take 5 mg by mouth daily.    Marland Kitchen buPROPion (WELLBUTRIN XL) 300 MG 24 hr tablet Take 300 mg by mouth at bedtime.     . clonazePAM (KLONOPIN) 0.5 MG tablet Take 0.5 mg by mouth at bedtime as needed for anxiety.     Marland Kitchen estradiol (VIVELLE-DOT) 0.05 MG/24HR patch Place 1 patch onto the skin 2 (two) times a week.    . hydrochlorothiazide (MICROZIDE) 12.5 MG capsule hydrochlorothiazide 12.5 mg capsule  TAKE 1 CAPSULE EVERY MORNING    . HYDROcodone-acetaminophen (NORCO/VICODIN) 5-325 MG tablet Take 1 tablet by mouth every 6 (six) hours as needed for moderate pain. 20 tablet 0  . metFORMIN (GLUCOPHAGE) 500 MG tablet Take 500 mg by mouth 2 (two) times daily with a meal.     . rosuvastatin (CRESTOR) 10 MG tablet Take 10 mg by mouth at bedtime.    . cyclobenzaprine (FLEXERIL) 5 MG tablet Take 1 tablet (5 mg total) by mouth 3 (three) times daily as needed for muscle spasms. 30 tablet 0  . gabapentin (NEURONTIN) 300 MG capsule Take 1 capsule (300 mg total) by mouth 3 (three) times daily. 45 capsule 0  . irbesartan-hydrochlorothiazide (AVALIDE) 150-12.5 MG tablet Take 1 tablet by mouth daily.    . polyethylene glycol (MIRALAX) packet Take 17 g by mouth 2 (two) times daily. 14 each 0   No facility-administered medications prior  to visit.    PAST MEDICAL HISTORY: Past Medical History:  Diagnosis Date  . Borderline diabetic   . Bowel obstruction (HCC)   . Complication of anesthesia    aspirated after breast reduction surgery and had n/v with that surgery  . Depression   . Diabetes mellitus without complication (HCC)   . Ectopic pregnancy   . Enteritis 11/04/2013  . Hypercholesteremia   . Hypertension   . Intestinal adhesions with partial obstruction (HCC)   . Mild cognitive impairment 07/25/2017  . Partial small bowel obstruction (HCC) 11/04/2013  . S/P small bowel resection 08/21/2017  . Sleep disorder   . Small bowel obstruction (HCC) 06/22/2017    PAST SURGICAL HISTORY: Past Surgical History:  Procedure Laterality Date  . ABDOMINAL HYSTERECTOMY  2003  . APPENDECTOMY  08/21/2017   Procedure: APPENDECTOMY;  Surgeon: Leafy Ro, MD;  Location: [facility] ORS;  Service: General;;  . BOWEL RESECTION  08/21/2017   Procedure: SMALL BOWEL RESECTION WITH PRIMARY ANASTOMOSIS;  Surgeon: Leafy Ro, MD;  Location: [facility] ORS;  Service: General;;  . BREAST  REDUCTION SURGERY Bilateral 1993  . BREAST SURGERY    . CESAREAN SECTION  2003  . COLONOSCOPY    . ECTOPIC PREGNANCY SURGERY     tubal removel  . ESOPHAGOGASTRODUODENOSCOPY    . EXPLORATORY LAPAROTOMY    . LAPAROSCOPIC LYSIS OF ADHESIONS N/A 08/21/2017   Procedure: LAPAROSCOPIC LYSIS OF ADHESIONS CONVERTED TO OPEN;  Surgeon: Leafy Ro, MD;  Location: [facility] ORS;  Service: General;  Laterality: N/A;  . TONSILLECTOMY    . UTERINE FIBROID SURGERY      FAMILY HISTORY: Family History  Problem Relation Age of Onset  . Diabetes Mother   . Dementia Father   . Dementia Paternal Grandmother   . Pulmonary embolism Brother   . Colon cancer Neg Hx     SOCIAL HISTORY: Social History   Socioeconomic History  . Marital status: Married    Spouse name: Not on file  . Number of children: 1  . Years of education: Not on file  . Highest education level: Not on  file  Occupational History  . Occupation: Teacher, adult education: [employer]  Tobacco Use  . Smoking status: Never Smoker  . Smokeless tobacco: Never Used  Vaping Use  . Vaping Use: Never used  Substance and Sexual Activity  . Alcohol use: No  . Drug use: No  . Sexual activity: Not on file  Other Topics Concern  . Not on file  Social History Narrative   Lives   Caffeine use:    Right handed    Social Determinants of Health   Financial Resource Strain:   . Difficulty of Paying Living Expenses: Not on file  Food Insecurity:   . Worried About Programme researcher, broadcasting/film/video in the Last Year: Not on file  . Ran Out of Food in the Last Year: Not on file  Transportation Needs:   . Lack of Transportation (Medical): Not on file  . Lack of Transportation (Non-Medical): Not on file  Physical Activity:   . Days of Exercise per Week: Not on file  . Minutes of Exercise per Session: Not on file  Stress:   . Feeling of Stress : Not on file  Social Connections:   . Frequency of Communication with Friends and Family: Not on file  . Frequency of Social Gatherings with Friends and Family: Not on file  . Attends Religious Services: Not on file  . Active Member of Clubs or Organizations: Not on file  . Attends Banker Meetings: Not on file  . Marital Status: Not on file  Intimate Partner Violence:   . Fear of Current or Ex-Partner: Not on file  . Emotionally Abused: Not on file  . Physically Abused: Not on file  . Sexually Abused: Not on file   PHYSICAL EXAM  Vitals:   01/20/20 1034  BP: 134/81  Pulse: 82  Weight: 175 lb 12.8 oz (79.7 kg)  Height: 5' 2.5" (1.588 m)   Body mass index is 31.64 kg/m.  Generalized: Well developed, in no acute distress  MMSE - Mini Mental State Exam 01/20/2020 07/25/2017  Orientation to time 5 5  Orientation to Place 5 5  Registration 3 3  Attention/ Calculation 5 5  Recall 1 2  Language- name 2 objects 2 2  Language- repeat 1 1  Language- follow  3 step command 3 3  Language- read & follow direction 1 1  Write a sentence 1 1  Copy design 1 1  Total score 28  29    Neurological examination  Mentation: Alert oriented to time, place, history taking. Follows all commands speech and language fluent Cranial nerve II-XII: Pupils were equal round reactive to light. Extraocular movements were full, visual field were full on confrontational test. Facial sensation and strength were normal. Head turning and shoulder shrug  were normal and symmetric. Motor: The motor testing reveals 5 over 5 strength of all 4 extremities. Good symmetric motor tone is noted throughout.  Sensory: Sensory testing is intact to soft touch on all 4 extremities. No evidence of extinction is noted.  Coordination: Cerebellar testing reveals good finger-nose-finger and heel-to-shin bilaterally.  Gait and station: Gait is normal. Tandem gait is normal. Romberg is negative. No drift is seen.  Reflexes: Deep tendon reflexes are symmetric and normal bilaterally.   DIAGNOSTIC DATA (LABS, IMAGING, TESTING) - I reviewed patient records, labs, notes, testing and imaging myself where available.  Lab Results  Component Value Date   WBC 14.0 (H) 12/28/2019   HGB 10.2 (L) 12/28/2019   HCT 33.3 (L) 12/28/2019   MCV 81.4 12/28/2019   PLT 317 12/28/2019      Component Value Date/Time   NA 141 12/28/2019 0110   K 3.4 (L) 12/28/2019 0110   CL 101 12/28/2019 0110   CO2 29 12/28/2019 0110   GLUCOSE 108 (H) 12/28/2019 0110   BUN 17 12/28/2019 0110   CREATININE 1.25 (H) 12/28/2019 0110   CALCIUM 9.5 12/28/2019 0110   PROT 7.4 12/28/2019 0110   ALBUMIN 3.6 12/28/2019 0110   AST 19 12/28/2019 0110   ALT 12 12/28/2019 0110   ALKPHOS 77 12/28/2019 0110   BILITOT 0.9 12/28/2019 0110   GFRNONAA 48 (L) 12/28/2019 0110   GFRAA 55 (L) 12/28/2019 0110   No results found for: CHOL, HDL, LDLCALC, LDLDIRECT, TRIG, CHOLHDL No results found for: [id number] Lab Results  Component Value Date    VITAMINB12 414 07/25/2017   Lab Results  Component Value Date   TSH 1.950 11/06/2013   ASSESSMENT AND PLAN 59 y.o. year old female  has a past medical history of Borderline diabetic, Bowel obstruction (HCC), Complication of anesthesia, Depression, Diabetes mellitus without complication (HCC), Ectopic pregnancy, Enteritis (11/04/2013), Hypercholesteremia, Hypertension, Intestinal adhesions with partial obstruction (HCC), Mild cognitive impairment (07/25/2017), Partial small bowel obstruction (HCC) (11/04/2013), S/P small bowel resection (08/21/2017), Sleep disorder, and Small bowel obstruction (HCC) (06/22/2017). here with:  1.  Progressive memory disturbance 2.  History of depression -MRI of the brain was completely normal, no evidence of atrophy or SVD -Depression is well controlled, on Wellbutrin -No signs of sleep apnea -PCP follows routine blood work, reportedly B12, TSH have been normal -MMSE 28/30 -Will try on Aricept 10 mg at bedtime -Follow-up in 6 months or sooner if needed, in the future, may consider neuropsychological evaluation, strong family history of Alzheimer's  I spent 30 minutes of face-to-face and non-face-to-face time with patient.  This included previsit chart review, lab review, study review, order entry, electronic health record documentation, patient education.  Margie EgeSarah Laiyla Slagel, AGNP-C, DNP 01/20/2020, 10:56 AM [facility] [address], [address] [place], [place] [address] [phone]

## 2020-01-20 NOTE — Progress Notes (Signed)
I have read the note, and I agree with the clinical assessment and plan.  [name]   

## 2020-01-20 NOTE — Patient Instructions (Addendum)
Start Aricept 10 mg at bedtime, start taking 1/2 tablet x 1 week, then take full tablet Memory score was good today 28/30 Make sure your primary doctor is checking TSH, B12 See you back in 6 months

## 2020-01-21 ENCOUNTER — Ambulatory Visit: Payer: Federal, State, Local not specified - PPO | Admitting: Allergy

## 2020-01-26 DIAGNOSIS — E7800 Pure hypercholesterolemia, unspecified: Secondary | ICD-10-CM | POA: Diagnosis not present

## 2020-01-26 DIAGNOSIS — E1169 Type 2 diabetes mellitus with other specified complication: Secondary | ICD-10-CM | POA: Diagnosis not present

## 2020-01-26 DIAGNOSIS — R413 Other amnesia: Secondary | ICD-10-CM | POA: Diagnosis not present

## 2020-01-26 DIAGNOSIS — Z0000 Encounter for general adult medical examination without abnormal findings: Secondary | ICD-10-CM | POA: Diagnosis not present

## 2020-01-26 DIAGNOSIS — I10 Essential (primary) hypertension: Secondary | ICD-10-CM | POA: Diagnosis not present

## 2020-02-02 DIAGNOSIS — E119 Type 2 diabetes mellitus without complications: Secondary | ICD-10-CM | POA: Diagnosis not present

## 2020-02-03 ENCOUNTER — Encounter: Payer: Self-pay | Admitting: Neurology

## 2020-02-04 ENCOUNTER — Encounter: Payer: Self-pay | Admitting: Psychology

## 2020-02-04 ENCOUNTER — Other Ambulatory Visit: Payer: Self-pay | Admitting: Neurology

## 2020-02-04 DIAGNOSIS — G3184 Mild cognitive impairment, so stated: Secondary | ICD-10-CM

## 2020-02-04 NOTE — Progress Notes (Signed)
She sent my chart message, will move forward with neuropsych referral for memory loss.

## 2020-02-24 ENCOUNTER — Encounter: Payer: Federal, State, Local not specified - PPO | Attending: Psychology | Admitting: Psychology

## 2020-02-24 ENCOUNTER — Encounter: Payer: Self-pay | Admitting: Psychology

## 2020-02-24 ENCOUNTER — Other Ambulatory Visit: Payer: Self-pay

## 2020-02-24 DIAGNOSIS — G3184 Mild cognitive impairment, so stated: Secondary | ICD-10-CM

## 2020-02-24 NOTE — Progress Notes (Signed)
NEUROBEHAVIORAL STATUS EXAM   Name: [name] Date of Birth: 1960-11-03 Date of Interview: 02/24/2020  Reason for Referral: [name] is a 59 y.o. female who is referred for neuropsychological evaluation by Margie Ege, NP of [facility] due to concerns about memory and expressive language changes. This patient is unaccompanied in the office during today's appointment.   History of Presenting Problem: [name] is a 59 year old right-handed African American female with a history of hypertension, pre-diabetes, and depression. She describes problems with memory and word-finding began around 2 years ago and have gradually gotten worse. She is still able to work full time and perform all activities of daily living.  She denies issues with keeping up with medications or appointments, she is able to operate a motor vehicle safely and she has no problems with directions.  She denies any issues doing the finances or cooking.  Sleep is impacted due to trouble falling and staying asleep. The patient denies any numbness or weakness of the extremities, she denies any balance issues, she has had some recent problems with intestinal obstruction, but she denies any issues controlling the bladder.  She reports that her father and the paternal grandmother had dementia.   She has been treated for depression via Wellbutrin for last 14 years. She has taken Klonopin as needed to treat sleep disturbance since 2013. She stopped taking Klonopin for around 6 months beginning in March 2020  but noted no difference in her memory. She also stopped taking a statin drug for a similar duration of time that did not make much impact; she currently takes Crestor to manage cholesterol.  Her brother died unexpectedly around 2 years ago. She believes memory problems were present before he died but admitted grief made things worse.   She reportedly did not engage in much physical activity for the past 2  years and did not have good diet. She is currently trying to eat better and exercise more during week; walks around 2 miles 3-4 times p/w.   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   Onset/Course: 2 years ago, gradually getting worse.   Upon direct questioning, the patient reported:   Forgetting recent conversations/events: Denied  Repeating statements/questions: Denied  Misplacing/losing items: Yes, around 2 x week (e.g., keys, purse, etc.)  Forgetting appointments or other obligations: Uses calender for assistance Forgetting to take medications: Denied  Other: difficulty remembering familiar people's names (e.g., coworkers), name of familiar objects (e.g. cup).   Difficulty concentrating: Yes, change in last 2 years Starting but not finishing tasks: More frequently than in past  Distracted easily: Yes, daily  Processing information more slowly: Slow down to think  Word-finding difficulty: Yes, daily Word substitutions: Yes, weekly  Writing difficulty: Denied  Spelling difficulty: Denied  Comprehension difficulty: Denied   Getting lost when driving: Denied  Making wrong turns when driving: Denied  Uncertain about directions when driving or passenger: Denied   Family neuro hx: Father and paternal grandmother had PD w/dementia   Any family hx dementia? Yes, father and paternal grandmother    Current Functioning: Work: Paramedic  Complex ADLs Driving: Denied  Medication management: no difficulties reported Management of finances: Share duty with husband; no difficulties reported Appointments: Uses calender for assistance; denied problems  Cooking: no difficulties reported  Medical/Physical complaints:  Any hx  of stroke/TIA, MI, LOC/TBI, Sz? Denied  Hx falls? Denied  Balance, probs walking? Denied  Sleep: Insomnia?  OSA? CPAP? REM sleep beh sx? Trouble falling and staying asleep.  Visual illusions/hallucinations? Denied  Appetite/Nutrition/Weight changes: Reduced appetite and mild weight loss due to medication (e.g.   Current mood: Frustrated   Behavioral disturbance/Personality change: Denied   Suicidal Ideation/Intention: Denied   Psychiatric History: History of depression, anxiety, other MH disorder: Depression associated with raising daughter with autism and mood/behavioral issues.   History of MH treatment: Treated for depressed mood via Wellbutrin for last 14 years. Klonopin to treat sleep disturbance since 2013.  History of SI: Denied  History of substance dependence/treatment: Denied   Social History: Born/Raised: [place], [place]  Education: Masters degree  Occupational history: Engineer, site for 8 years.  Marital history: Married 33 years  Children: 66 year old daughter  Alcohol: Denied  Tobacco: Denied  SA: Denied   Medical History: Past Medical History:  Diagnosis Date  . Borderline diabetic   . Bowel obstruction (HCC)   . Complication of anesthesia    aspirated after breast reduction surgery and had n/v with that surgery  . Depression   . Diabetes mellitus without complication (HCC)   . Ectopic pregnancy   . Enteritis 11/04/2013  . Hypercholesteremia   . Hypertension   . Intestinal adhesions with partial obstruction (HCC)   . Mild cognitive impairment 07/25/2017  . Partial small bowel obstruction (HCC) 11/04/2013  . S/P small bowel resection 08/21/2017  . Sleep disorder   . Small bowel obstruction (HCC) 06/22/2017   Imaging: MRI brain 08/01/17:  IMPRESSION: Unremarkable MRI scan of the brain without contrast.  Current Medications:  Outpatient Encounter Medications as of 02/24/2020  Medication Sig  . amLODipine (NORVASC) 5 MG tablet Take 5 mg by mouth daily.  Marland Kitchen buPROPion (WELLBUTRIN XL) 300 MG 24 hr tablet Take 300 mg by mouth at bedtime.   . clonazePAM (KLONOPIN) 0.5  MG tablet Take 0.5 mg by mouth at bedtime as needed for anxiety.   . donepezil (ARICEPT) 10 MG tablet Take 1 tablet (10 mg total) by mouth at bedtime.  Marland Kitchen estradiol (VIVELLE-DOT) 0.05 MG/24HR patch Place 1 patch onto the skin 2 (two) times a week.  . hydrochlorothiazide (MICROZIDE) 12.5 MG capsule hydrochlorothiazide 12.5 mg capsule  TAKE 1 CAPSULE EVERY MORNING  . HYDROcodone-acetaminophen (NORCO/VICODIN) 5-325 MG tablet Take 1 tablet by mouth every 6 (six) hours as needed for moderate pain.  . metFORMIN (GLUCOPHAGE) 500 MG tablet Take 500 mg by mouth 2 (two) times daily with a meal.   . rosuvastatin (CRESTOR) 10 MG tablet Take 10 mg by mouth at bedtime.   No facility-administered encounter medications on file as of 02/24/2020.   Behavioral Observations:   Appearance: Neatly, casually and appropriately dressed and groomed. Gait: Ambulated independently, no gross abnormalities observed. Speech: Fluent; normal rate, rhythm and volume. Mild word finding difficulty. Thought process: Linear, goal directed, and logical Affect: Full, euthymic  Interpersonal: Pleasant, appropriate  60 minutes spent face-to-face with patient completing neurobehavioral status exam. 60 minutes spent integrating medical records/clinical data and completing this report. [id number] unit; [id number].  TESTING: There is medical necessity to proceed with neuropsychological assessment as the results will be used to aid in differential diagnosis and clinical decision-making and to inform specific treatment recommendations. Per the patient, and medical records reviewed, there has been a change in cognitive functioning and a reasonable suspicion of a neurocognitive disorder.  Clinical Decision Making: In considering the patient's  current level of functioning, level of presumed impairment, nature of symptoms, emotional and behavioral responses during the interview, level of literacy, and observed level of motivation, a battery of  tests was selected for patient to complete during scheduled testing appointment.   PLAN: The patient will return to complete the above referenced full battery of neuropsychological testing with this provider on 03/15/20. Education regarding testing procedures was provided to the patient. Subsequently, the patient will see this provider for a follow-up session at which time her test performances and my impressions and treatment recommendations will be reviewed in detail.   Evaluation ongoing; full report to follow.

## 2020-03-11 ENCOUNTER — Ambulatory Visit: Payer: Federal, State, Local not specified - PPO | Admitting: Allergy

## 2020-03-11 ENCOUNTER — Other Ambulatory Visit: Payer: Self-pay

## 2020-03-11 ENCOUNTER — Encounter: Payer: Self-pay | Admitting: Allergy

## 2020-03-11 VITALS — BP 140/78 | HR 99 | Resp 18 | Ht 62.5 in | Wt 169.4 lb

## 2020-03-11 DIAGNOSIS — S00521A Blister (nonthermal) of lip, initial encounter: Secondary | ICD-10-CM

## 2020-03-11 DIAGNOSIS — T783XXD Angioneurotic edema, subsequent encounter: Secondary | ICD-10-CM | POA: Diagnosis not present

## 2020-03-11 NOTE — Progress Notes (Signed)
New Patient Note  RE: [name] MRN: [id number] DOB: 1960-11-13 Date of Office Visit: 03/11/2020  Referring provider: Collene MaresMiller, Virginia E, PA Primary care provider: Renford DillsPolite, Ronald, MD  Chief Complaint: allergic reaction   History of present illness: [name] is a 59 y.o. female presenting today for consultation for angioedema/allergic reaction.   She has had 2 episodes of allergic type reactions.  In June 2021 she thought she was reacting to the mask she was wearing.  Started out with itching of lip in one spot of upper left lip.  Then noted that face felt more full and had a rash around hr face that appears to be an outline of the mask.  She also had lip and facial swelling with this.  She took benadryl.  She went to employee health.  She states the bump on her lip turned into a blister and the swelling worsened.  The blister then ruptured and crusted over.  She states it took about a month for it to clear up leaving hyperpigmentation.  She states they did use a new [employer] mask box at work and another coworker also complained of facial ras thus this box was disregarded and returned using other mask.     About a month after the initial incident she states she had another episode with more facial swelling where she had lip itch, blister and crusting again.  She saw her PCP and the bump on lip was cultured and was negative.  She did take acylovir and was using mupirocin.  She also was provided with prednisone taper at the same time.  It did improve some but she is unsure if it was the antiviral or the steroid that helped the most.  She states it took about 3 weeks to fully resolve however prior to that it wasn't getting much better and saw her PCP again.  Per [name] PCP thought symptoms may be due to Ibesartan she was taking.  She states she had been on the ibesartan for more than 5 years previously without issue.  Since stopping the medication she has not had any further rash reactoins.  However  does report about a month ago she had tongue swelling and swelling around neck.  She was seen in ED and on exam was noted to have a "right submandibular swelling that is tender to touch" and had imaging and was diagnosed with salivary cyst seen on imaging.  There was a "2 mm stone within the right submandibular duct.  No ductal dilatation.  There is mild stranding adjacent to the right submandibular gland.  The other salivary glands are normal."  She was given prescription for clindamycin for this.  She was seen by ENT for this.  And per patient there was not much to be done given the small size of the stone.  She states it took about 3 weeks for the swelling to resolve.   Of note for the first 2 episodes she states she had been taking a course of Diflucan however was not on this medication at the third episode. She denies any insect bites or stings of the lip.  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  Yeas ago she had an ectopic pregnancy that led to scar tissue development.  She also had a appendectomy and a pregnancy with C-section.    No history of asthma, eczema, food allergy or allergic rhinitis with conjunctivitis.   Review  of systems in the past 4 weeks: Review of Systems  Constitutional: Negative.   HENT: Negative.   Eyes: Negative.   Respiratory: Negative.   Cardiovascular: Negative.   Gastrointestinal: Negative.   Musculoskeletal: Negative.   Skin: Negative.   Neurological: Negative.     All other systems negative unless noted above in HPI  Past medical history: Past Medical History:  Diagnosis Date   Borderline diabetic    Bowel obstruction (HCC)    Complication of anesthesia    aspirated after breast reduction surgery and had n/v with that surgery   Depression    Diabetes mellitus without complication (HCC)    Ectopic pregnancy    Enteritis 11/04/2013   Hypercholesteremia    Hypertension    Intestinal adhesions  with partial obstruction (HCC)    Mild cognitive impairment 07/25/2017   Partial small bowel obstruction (HCC) 11/04/2013   S/P small bowel resection 08/21/2017   Sleep disorder    Small bowel obstruction (HCC) 06/22/2017    Past surgical history: Past Surgical History:  Procedure Laterality Date   ABDOMINAL HYSTERECTOMY  2003   APPENDECTOMY  08/21/2017   Procedure: APPENDECTOMY;  Surgeon: Leafy Ro, MD;  Location: [facility] ORS;  Service: General;;   BOWEL RESECTION  08/21/2017   Procedure: SMALL BOWEL RESECTION WITH PRIMARY ANASTOMOSIS;  Surgeon: Leafy Ro, MD;  Location: [facility] ORS;  Service: General;;   BREAST REDUCTION SURGERY Bilateral 1993   BREAST SURGERY     CESAREAN SECTION  2003   COLONOSCOPY     ECTOPIC PREGNANCY SURGERY     tubal removel   ESOPHAGOGASTRODUODENOSCOPY     EXPLORATORY LAPAROTOMY     LAPAROSCOPIC LYSIS OF ADHESIONS N/A 08/21/2017   Procedure: LAPAROSCOPIC LYSIS OF ADHESIONS CONVERTED TO OPEN;  Surgeon: Leafy Ro, MD;  Location: [facility] ORS;  Service: General;  Laterality: N/A;   TONSILLECTOMY     UTERINE FIBROID SURGERY      Family history:  Family History  Problem Relation Age of Onset   Diabetes Mother    Dementia Father    Dementia Paternal Grandmother    Pulmonary embolism Brother    Colon cancer Neg Hx     Social history: Lives in a home with out carpeting with gas heating and central cooling.  No pets in the home.  There is no concern for water damage, mildew or roaches in the home.  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  Denies a smoking history.   Medication List: Current Outpatient Medications  Medication Sig Dispense Refill   buPROPion (WELLBUTRIN XL) 300 MG 24 hr tablet Take 300 mg by mouth at bedtime.      clonazePAM (KLONOPIN) 0.5 MG tablet Take 0.5 mg by mouth at bedtime as needed for anxiety.      estradiol (VIVELLE-DOT) 0.05 MG/24HR patch Place 1 patch onto the skin 2  (two) times a week.     hydrochlorothiazide (MICROZIDE) 12.5 MG capsule hydrochlorothiazide 12.5 mg capsule  TAKE 1 CAPSULE EVERY MORNING     metFORMIN (GLUCOPHAGE) 500 MG tablet Take 500 mg by mouth 2 (two) times daily with a meal.      rosuvastatin (CRESTOR) 10 MG tablet Take 10 mg by mouth at bedtime.     No current facility-administered medications for this visit.    Known medication allergies: Allergies  Allergen Reactions   Amoxicillin Anaphylaxis    Has patient had a PCN reaction causing immediate rash, facial/tongue/throat swelling, SOB or  lightheadedness with hypotension: Yes Has patient had a PCN reaction causing severe rash involving mucus membranes or skin necrosis: No Has patient had a PCN reaction that required hospitalization: Yes Has patient had a PCN reaction occurring within the last 10 years: No If all of the above answers are "NO", then may proceed with Cephalosporin use.  Other reaction(s): Other (See Comments) Has patient had a PCN reaction causing immediate rash, facial/tongue/throat swelling, SOB or lightheadedness with hypotension: Yes Has patient had a PCN reaction causing severe rash involving mucus membranes or skin necrosis: No Has patient had a PCN reaction that required hospitalization: Yes Has patient had a PCN reaction occurring within the last 10 years: No If all of the above answers are "NO", then may proceed with Cephalosporin use.   Strawberry Extract Shortness Of Breath, Itching and Swelling    Other reaction(s): Other (See Comments)   Tetracycline Anaphylaxis   Tetracyclines & Related Nausea And Vomiting and Rash    Other reaction(s): Other (See Comments)   Fluconazole Photosensitivity   Ibuprofen Other (See Comments)    Acute renal failure Other reaction(s): Other Acute renal failure Acute renal failure    Other Nausea And Vomiting    Other reaction(s): Other (See Comments)     Physical examination: Blood pressure 140/78,  pulse 99, resp. rate 18, height 5' 2.5" (1.588 m), weight 169 lb 6.4 oz (76.8 kg), SpO2 95 %.  General: Alert, interactive, in no acute distress. HEENT: PERRLA, TMs pearly gray, turbinates non-edematous without discharge, post-pharynx non erythematous. Neck: Supple without lymphadenopathy. Lungs: Clear to auscultation without wheezing, rhonchi or rales. {no increased work of breathing. CV: Normal S1, S2 without murmurs. Abdomen: Nondistended, nontender. Skin: rim of hyperpigmentation over the left side of upper lip. Extremities:  No clubbing, cyanosis or edema. Neuro:   Grossly intact.  Diagnositics/Labs:  Allergy testing: environmental allergy skin prick testing is negative with a positive histamine control.  Common 10 food allergen skin prick testing is negative.  Allergy testing results were read and interpreted by provider, documented by clinical staff.   Assessment and plan:   Angioedema Blistering dermatitis of the lip  -3 episodes involving facial swelling; 2 of which also involved a blistering type lesion that crusted over on the lip.  She had negative cultures from the lesion.  No inciting identifiable trigger at this time.  There is some concern that the irbesartan she was on may be the culprit.  ARBs can definitely lead to angioedema similarly to ACE inhibitor's however the incidence is much much less.  It typically is limited to angioedema thus not sure as to the cause of the blistering dermatitis she also developed.  Since discontinuation of irbesartan she has not had any return of the blistering lesion. -Given the angioedema will rule out hereditary angioedema and will also obtain a tryptase level to determine if she may have some mast cell activating issues -We will also obtain auto skin antibodies to determine if she has a predisposing factor to a blistering skin conditions -Environmental and food allergy testing today is negative -Advised that she does have access to an  epinephrine device in case of allergic reaction.  We will provide with an emergency action plan to follow. -Advise she have access to antihistamines at home to initiate at the onset of any itch -Advised if she develops further blistering lesions that she should see dermatology for potential biopsy and treatment -Further recommendations will be based on lab results  I appreciate the opportunity  to take part in Ryiah's care. Please do not hesitate to contact me with questions.  Sincerely,   Margo Aye, MD Allergy/Immunology [facility]

## 2020-03-11 NOTE — Patient Instructions (Addendum)
Angioedema Blistering dermatitis of the lip  -3 episodes involving facial swelling; 2 of which also involved a blistering type lesion that crusted over on the lip.  She had negative cultures from the lesion.  No inciting identifiable trigger at this time.  There is some concern that the irbesartan she was on may be the culprit.  ARBs can definitely lead to angioedema similarly to ACE inhibitor's however the incidence is much much less.  It typically is limited to angioedema thus not sure as to the cause of the blistering dermatitis she also developed.  Since discontinuation of irbesartan she has not had any return of the blistering lesion. -Given the angioedema will rule out hereditary angioedema and will also obtain a tryptase level to determine if she may have some mast cell activating issues -We will also obtain auto skin antibodies to determine if she has a predisposing factor to a blistering skin conditions -Environmental and food allergy testing today is negative -Advised that she does have access to an epinephrine device in case of allergic reaction.  We will provide with an emergency action plan to follow. -Advise she have access to antihistamines at home to initiate at the onset of any itch -Advised if she develops further blistering lesions that she should see dermatology for potential biopsy and treatment -Further recommendations will be based on lab results

## 2020-03-15 ENCOUNTER — Encounter: Payer: Federal, State, Local not specified - PPO | Attending: Psychology | Admitting: Psychology

## 2020-03-15 ENCOUNTER — Other Ambulatory Visit: Payer: Self-pay

## 2020-03-15 ENCOUNTER — Encounter: Payer: Self-pay | Admitting: Psychology

## 2020-03-15 DIAGNOSIS — F0150 Vascular dementia without behavioral disturbance: Secondary | ICD-10-CM | POA: Diagnosis not present

## 2020-03-15 DIAGNOSIS — G3184 Mild cognitive impairment, so stated: Secondary | ICD-10-CM | POA: Insufficient documentation

## 2020-03-15 NOTE — Progress Notes (Signed)
   Neuropsychology Note  [name] completed 240 minutes of neuropsychological testing with this provider. The patient did not appear overtly distressed by the testing session, per behavioral observation or via self-report. Rest breaks were offered.    Clinical Decision Making: In considering the patient's current level of functioning, level of presumed impairment, nature of symptoms, emotional and behavioral responses during the interview, level of literacy, and observed level of motivation/effort, a battery of tests was selected. Changes were made as deemed necessary based on patient performance on testing, my observations and additional pertinent factors such as those listed above.  [name] will return on 03/23/20 for an interactive feedback session with this provider at which time her test performances, clinical impressions and treatment recommendations will be reviewed in detail. The patient understands she can contact our office should she require our assistance before this time.  Full report to follow.

## 2020-03-17 ENCOUNTER — Other Ambulatory Visit: Payer: Self-pay

## 2020-03-17 ENCOUNTER — Encounter: Payer: Self-pay | Admitting: Psychology

## 2020-03-17 ENCOUNTER — Encounter (HOSPITAL_BASED_OUTPATIENT_CLINIC_OR_DEPARTMENT_OTHER): Payer: Federal, State, Local not specified - PPO | Admitting: Psychology

## 2020-03-17 DIAGNOSIS — F0150 Vascular dementia without behavioral disturbance: Secondary | ICD-10-CM | POA: Diagnosis not present

## 2020-03-17 DIAGNOSIS — G3184 Mild cognitive impairment, so stated: Secondary | ICD-10-CM | POA: Diagnosis not present

## 2020-03-17 DIAGNOSIS — F01A0 Vascular dementia, mild, without behavioral disturbance, psychotic disturbance, mood disturbance, and anxiety: Secondary | ICD-10-CM

## 2020-03-18 NOTE — Progress Notes (Addendum)
PRELIMINARY REPORT - FULL REPORT TO FOLLOW ONCE TEST RESULTS ARE AVAILABLE  She is scheduled for another 2 hour testing appointment to further assess language, visuoperception, and memory  NEUROPSYCHOLOGICAL EVALUATION   Name:    [name]  Date of Birth:   [birth year] Date of Interview:  02/24/2020 Date of Testing:  03/15/2020   Date of Feedback:  03/23/2020      Background Information:  Reason for Referral:  [name] is a 59 y.o. female referred by Margie Ege, NP of [facility] to assess her current level of cognitive functioning and assist in differential diagnosis. The current evaluation consisted of a review of available medical records, an interview with the patient,  and the completion of a neuropsychological testing battery. Informed consent was obtained.  History of Presenting Problem:  [name] is a 58 year old right-handed African American female with a history of hypertension, pre-diabetes, and depression. She describes problems with memory and word-finding began around 2 years ago and have gradually gotten worse. She is still able to work full time and perform all activities of daily living. She denies issues with keeping up with medications or appointments, she is able to operate a motor vehicle safely and she has no problems with directions. She denies any issues doing the finances or cooking. Sleep is impacted due to trouble falling and staying asleep. The patient denies any numbness or weakness of the extremities, she denies any balance issues, she has had some recent problems with intestinal obstruction, but she denies any issues controlling the bladder. She reports that her father and the paternal grandmother had dementia.   She has been treated for depression via Wellbutrin for last 14 years. She has taken Klonopin as needed to treat sleep disturbance since 2013. She stopped taking Klonopin for around 6 months beginning in March 2020   but noted no difference in her memory. She also stopped taking a statin drug for a similar duration of time that did not make much impact; she currently takes Crestor to manage cholesterol.  Her brother died unexpectedly around 2 years ago. She believes memory problems were present before he died but admitted grief made things worse.   She reportedly did not engage in much physical activity for the past 2 years and did not have good diet. She is currently trying to eat better and exercise more during week; walks around 2 miles 3-4 times p/w.   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   Medical History:  Past Medical History:  Diagnosis Date  . Borderline diabetic   . Bowel obstruction (HCC)   . Complication of anesthesia    aspirated after breast reduction surgery and had n/v with that surgery  . Depression   . Diabetes mellitus without complication (HCC)   . Ectopic pregnancy   . Enteritis 11/04/2013  . Hypercholesteremia   . Hypertension   . Intestinal adhesions with partial obstruction (HCC)   . Mild cognitive impairment 07/25/2017  . Partial small bowel obstruction (HCC) 11/04/2013  . S/P small bowel resection 08/21/2017  . Sleep disorder   . Small bowel obstruction (HCC) 06/22/2017   Imaging: MRI brain 08/01/17: Mild periventricular changes associated with chronic microvascular ischemic changes. Unremarkable overall  Current medications:  Outpatient Encounter Medications as of 03/17/2020  Medication Sig  . buPROPion (WELLBUTRIN XL) 300  MG 24 hr tablet Take 300 mg by mouth at bedtime.   . clonazePAM (KLONOPIN) 0.5 MG tablet Take 0.5 mg by mouth at bedtime as needed for anxiety.   Marland Kitchen estradiol (VIVELLE-DOT) 0.05 MG/24HR patch Place 1 patch onto the skin 2 (two) times a week.  . hydrochlorothiazide (MICROZIDE) 12.5 MG capsule  hydrochlorothiazide 12.5 mg capsule  TAKE 1 CAPSULE EVERY MORNING  . metFORMIN (GLUCOPHAGE) 500 MG tablet Take 500 mg by mouth 2 (two) times daily with a meal.   . rosuvastatin (CRESTOR) 10 MG tablet Take 10 mg by mouth at bedtime.   No facility-administered encounter medications on file as of 03/17/2020.   Current Examination:  Behavioral Observations:  [name] arrived at her scheduled appointments on time and was unaccompanied. She was casually attired and well groomed.  Vision was corrected by glasses and hearing was adequate for testing purposes.  She did not display any gross motor or movement abnormalities; her gait was steady and no tremors were observed.   The patient was alert and oriented.  Her mood was somewhat depressed but bright and euthymic at times and she was pleasant and affable.  She made good eye contact. Inappropriate laughter was observed on occasion, particularly when confronted with weaknesses. Affect was full and congruent with mood. Speech was as expected with mild word finding difficulties present.  Thought processes were generally linear but somewhat disorganize and tangential. Insight and judgment were fair. The patient demonstrated good persistence and appeared to apply good effort on the cognitive tasks.  Therefore, the test results are judged to be a valid reflection of her current level of functioning.  Tests Administered: . [place] Verbal Learning Test, 3rd Edition (CVLT-3)- Standard Form . Wechsler Adult Intelligence Scale, 4th Edition (WAIS-IV)- 10 core subtests  . Wechsler Memory Scale, 4th Edition (WMS-IV; Adult Battery)- Select subtests   Test Results: Note: Standardized scores are presented only for use by appropriately trained professionals and to allow for any future test-retest comparison. These scores should not be interpreted without consideration of all the information that is contained in the rest of the report. The most recent standardization  samples from the test publisher or other sources were used whenever possible to derive standard scores; scores were corrected for age, gender, ethnicity and education when available.   Test Scores:  WAIS-IV Composite Score Summary  Scale Sum of Scaled Scores Composite Score Percentile Rank 95% Conf. Interval Qualitative Description  Verbal Comprehension 25 VCI 91 27 86-97 Average  Perceptual Reasoning 16 PRI 73 4 68-81 Borderline  Working Memory 16 WMI 89 23 83-96 Low Average  Processing Speed 16 PSI 89 23 82-98 Low Average  Full Scale 73 FSIQ 81 10 77-85 Low Average  General Ability 41 GAI 80 9 76-86 Low Average   Differences Between Subtest and Overall Mean of Subtest Scores  Subtest Subtest Scaled Score Mean Scaled Score Difference Critical Value .05 Strength or Weakness Base Rate  Block Design 5 7.30 -2.30 2.85  >25%  Similarities 8 7.30 0.70 2.82  >25%  Digit Span 9 7.30 1.70 2.22  >25%  Matrix Reasoning 5 7.30 -2.30 2.54  >25%  Vocabulary 10 7.30 2.70 2.03 S 15%  Arithmetic 7 7.30 -0.30 2.73  >25%  Symbol Search 6 7.30 -1.30 3.42  >25%  Visual Puzzles 6 7.30 -1.30 2.71  >25%  Information 7 7.30 -0.30 2.19  >25%  Coding 10 7.30 2.70 2.97  15-25%   Working Memory Process Score Summary  Process Score Raw  Score Scaled Score Percentile Rank Base Rate SEM  Digit Span Forward 8 8 25  -- 1.44  Digit Span Backward 10 12 75 -- 1.27  Digit Span Sequencing 7 9 37 -- 1.37  Longest Digit Span Forward 5 -- -- 94.5 --  Longest Digit Span Backward 5 -- -- 45.0 --  Longest Digit Span Sequence 5 -- -- 81.5 --   WMS-IV Primary Subtest Scaled Score Summary  Subtest Domain Raw Score Scaled Score Percentile Rank  Logical Memory I AM 22 9 37  Logical Memory II AM 20 10 50  Visual Reproduction I VM 25 6 9   Visual Reproduction II VM 17 8 25   Symbol Span VWM 18 9 37   Auditory Memory Process Score Summary  Process Score Raw Score Scaled Score Percentile Rank Cumulative  Percentage (Base Rate)  LM II Recognition 26 - - >75%   Visual Memory Process Score Summary  Process Score Raw Score Scaled Score Percentile Rank Cumulative Percentage (Base Rate)  VR II Recognition 6 - - 51-75%   CVLT-3 Index Sum of scaled scores Index score Percentile rank    Trials 1-5 Correct 40 89 23    Delayed Recall Correct 28 84 14    Total Recall Correct 79 88 21      CVLT-3 SF Score Raw score Scaled score Percentile rank    Short Delay Free Recall Correct 7 7 16     Short Delay Cued Recall Correct 7 6 9     Long Delay Free Recall Correct 9 8 25     Long Delay Cued Recall Correct 8 7 16      Score Sum of raw scores Standard score Percentile rank    Total Recall Responses 92 89 23     Score Raw score Base rate    Total Correct/Total Correct + Intrusions Ratio 0.9 40.0    Total Correct/Total Correct + Total Target Repetitions Ratio 0.9 52.0     Description of Test Results:   Intellectual Functioning  Premorbid verbal intellectual abilities were estimated to have been at least average based on educational and occupational history. [name] obtained a WAIS-IV Full-Scale IQ of 35 which places her at the below average range of intellectual functioning (10th percentile).  Her Verbal Comprehension Index (VCI) score was in the average range (27th percentile) while her Perceptual Reasoning Index (PRI) score was in the borderline impaired range (4th percentile) for her age.    Attention and Information Processing Speed The patient was able to repeat up to 5 digits forward, backward, and in sequence on a measure of attention span, which were average overall for her age (Digit Span=37th percentile). She scored below average on a measure of mental computation (Arithmetic=16th percentile). Basic auditory attention was relatively weaker than more complex working memory. Visual working memory was average (Symbol Span=37th percentile).  She performed below average (Symbol  Search=9th percentile on a test involving horizontal visual scanning and and discrimination. In contrast , performance improved on another information processing speed tasks involving symbol copying as well as vertical scanning and discrimination (Coding=50th percentile).  Language As previously discussed, her Verbal Comprehension Index (VCI) score was in the average range (27th percentile). Within the verbal domain, the patient obtained average range scores on measures of vocabulary (Vocabulary=50th percentile) and  verbal abstraction (Similarities=25th percentile), but below average on a test of general knowledge (Information=16th  percentile)   Visual Perceptual/Spatial Skills The patient's visual perception was relatively reduced and significant below verbal abilities. On perceptual reasoning subtests,  the patient performed in the borderline impaired range on measures of visuoconstructional Aeronautical engineer(Block Design = 5th percentile) and non-verbal reasoning (Matrix Reasoning=5th percentile). Performance on a timed measure of visual-spatial analysis and integration was below average (Visual Puzzles=9th percentile).    Learning and Memory On measures of auditory verbal memory, the patient's immediate recall of story details was in the average range for her age (Logical Memory I=37th percentile).  She retained 91% of the information after a 20-25-minute delay, which was again an average range (LM II=50th percentile); Recognition was above average.    On a list learning and memory task, she recalled a total of 40/80 word pairs across 5 trials, showing slightly flattened  learning curve and below average encoding and acquisition of non-contextual information. After a brief distracter task, free recall remained below average. Short delay cued recall was below average (recalled 7/16 words). After a delay, free recall improved to average (recalled 9/16 words). Performance on a yes/no recognition task dropped to below  average.    The patient's recall of simple line figures immediately after presentation was in the below average range (VR-I=9th percentile). Her performance improved to the average range after a 20-25 minute delay (VR II=25th percentile). Recognition of these line figures was also average (VR-II Recognition=51st-75th percentile).   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 However, there are a few areas of mild impairment suggestive of mild fronto-subcortical dysfunction (e.g., variable encoding abilities with intact consolidation). Her testing results do not warrant a diagnosis of dementia; however, a diagnosis mild cognitive impairment is appropriate at this time. She is still able to work full time and perform all activities of daily living.   Based on her medical history including multiple vascular risk factors, and recent neuroimaging demonstrating mild periventricular changes associated with chronic microvascular ischemic changes,  a vascular etiology is suspected. Her brother died unexpectedly around 2 years ago and she still appears to be grieving this loss; reportedly believes memory problems were present before but admitted this made things worse. She has been treated for depression via Wellbtriun for many years and was prescribed Klonopin as needed to treat sleep disturbance from 2013-2020; denied change in memory after stopping Klonopin. She reportedly stopped taking a statin drug for a similar duration of time that did not make much impact; she currently takes Crestor to manage cholesterol.  Recommendations: Based on the findings of the present evaluation, the following recommendations are offered:  1. Optimal control of vascular risk factors is necessary  to reduce the risk of further cognitive decline.  2. Based on her test performances, the patient will likely benefit from repetition of to-be-remembered information.  3. She may benefit from grief counseling or other form of individual psychotherapy to address depression and anxiety.  4. Neuropsychological re-assessment in one year (or sooner if there is a change in cognition or behavior) is recommended in order to monitor cognitive status, track any progression of symptoms and further assist with treatment planning.   Feedback to Patient: [name] will return for a feedback appointment on 03/23/2020 to review the results of her neuropsychological evaluation with this provider.   Thank you for your referral of [name]. Please feel free to contact me if you have any questions or concerns regarding this report.

## 2020-03-20 LAB — C1 ESTERASE INHIBITOR: C1INH SerPl-mCnc: 24 mg/dL (ref 21–39)

## 2020-03-20 LAB — C4 COMPLEMENT: Complement C4, Serum: 31 mg/dL (ref 12–38)

## 2020-03-20 LAB — COMPLEMENT COMPONENT C1Q: Complement C1Q: 15.2 mg/dL (ref 10.3–20.5)

## 2020-03-20 LAB — C1 ESTERASE INHIBITOR, FUNCTIONAL: C1INH Functional/C1INH Total MFr SerPl: >91 %mean normal

## 2020-03-20 LAB — TRYPTASE: Tryptase: 2.6 ug/L (ref 2.2–13.2)

## 2020-03-20 LAB — ANTISKIN AUTOANTIBODIES, QUANT: Pemphigus: NEGATIVE

## 2020-03-23 ENCOUNTER — Other Ambulatory Visit: Payer: Self-pay

## 2020-03-23 ENCOUNTER — Encounter: Payer: Self-pay | Admitting: Psychology

## 2020-03-23 ENCOUNTER — Telehealth: Payer: Self-pay | Admitting: Psychology

## 2020-03-23 ENCOUNTER — Encounter: Payer: Federal, State, Local not specified - PPO | Admitting: Psychology

## 2020-03-23 DIAGNOSIS — G3184 Mild cognitive impairment, so stated: Secondary | ICD-10-CM | POA: Diagnosis not present

## 2020-03-23 DIAGNOSIS — F0150 Vascular dementia without behavioral disturbance: Secondary | ICD-10-CM | POA: Diagnosis not present

## 2020-03-23 DIAGNOSIS — F01A0 Vascular dementia, mild, without behavioral disturbance, psychotic disturbance, mood disturbance, and anxiety: Secondary | ICD-10-CM

## 2020-03-23 NOTE — Telephone Encounter (Signed)
Patient would like for you to call her about MRI.

## 2020-03-23 NOTE — Progress Notes (Signed)
Neuropsychology Feedback Appointment  [name] returned for a feedback appointment today to review the results of her recent neuropsychological evaluation with this provider. 60 minutes face-to-face time was spent reviewing her test results, my impressions and my recommendations as detailed in her report. Rational for additional testing~2 hours (e.g., broaden baseline data to bolster futrure comparisons via serial assessment/monitoring) was discussed and she agreed with this plan. We scheduled another 2 hour testing appointment for 03/29/20. I administered a test of spatial judgement and two verbal fluency measures. Spatial judgement was relatively weak and below expectation; not wearing new prescriptive lenses but believes would not helped. Verbal fluency was grossly intact (e.g., lexical and semantic).Education was provided about her vascular risk factors and the impact chronic stress and depression have on cognitive functioning over time. The patient was given the opportunity to ask questions, and I did my best to answer these to her satisfaction. Below is a copy of the results, clinical impressions, and recommendations from the evaluation, dated 03/17/20 in her EMR. The initial consultation/interview was completed on 02/24/20. ____________________________________________________________________________________________________  Test Results: Note: Standardized scores are presented only for use by appropriately trained professionals and to allow for any future test-retest comparison. These scores should not be interpreted without consideration of all the information that is contained in the rest of the report. The most recent standardization samples from the test publisher or other sources were used whenever possible to derive standard scores; scores were corrected for age, gender, ethnicity and education when available.   Test Scores:  WAIS-IV Composite Score Summary  Scale Sum of Scaled Scores  Composite Score Percentile Rank 95% Conf. Interval Qualitative Description  Verbal Comprehension 25 VCI 91 27 86-97 Average  Perceptual Reasoning 16 PRI 73 4 68-81 Borderline  Working Memory 16 WMI 89 23 83-96 Low Average  Processing Speed 16 PSI 89 23 82-98 Low Average  Full Scale 73 FSIQ 81 10 77-85 Low Average  General Ability 41 GAI 80 9 76-86 Low Average   Differences Between Subtest and Overall Mean of Subtest Scores  Subtest Subtest Scaled Score Mean Scaled Score Difference Critical Value .05 Strength or Weakness Base Rate  Block Design 5 7.30 -2.30 2.85  >25%  Similarities 8 7.30 0.70 2.82  >25%  Digit Span 9 7.30 1.70 2.22  >25%  Matrix Reasoning 5 7.30 -2.30 2.54  >25%  Vocabulary 10 7.30 2.70 2.03 S 15%  Arithmetic 7 7.30 -0.30 2.73  >25%  Symbol Search 6 7.30 -1.30 3.42  >25%  Visual Puzzles 6 7.30 -1.30 2.71  >25%  Information 7 7.30 -0.30 2.19  >25%  Coding 10 7.30 2.70 2.97  15-25%   Working Engineer, water Score Summary  Process Score Raw Score Scaled Score Percentile Rank Base Rate SEM  Digit Span Forward 8 8 25  -- 1.44  Digit Span Backward 10 12 75 -- 1.27  Digit Span Sequencing 7 9 37 -- 1.37  Longest Digit Span Forward 5 -- -- 94.5 --  Longest Digit Span Backward 5 -- -- 45.0 --  Longest Digit Span Sequence 5 -- -- 81.5 --   WMS-IV Primary Subtest Scaled Score Summary  Subtest Domain Raw Score Scaled Score Percentile Rank  Logical Memory I AM 22 9 37  Logical Memory II AM 20 10 50  Visual Reproduction I VM 25 6 9   Visual Reproduction II VM 17 8 25   Symbol Span VWM 18 9 37   Auditory Memory Process Score Summary  Process Score Raw Score  Scaled Score Percentile Rank Cumulative Percentage (Base Rate)  LM II Recognition 26 - - >75%   Visual Memory Process Score Summary  Process Score Raw Score Scaled Score Percentile Rank Cumulative Percentage (Base Rate)  VR II Recognition 6 - - 51-75%   CVLT-3 Index Sum of scaled scores Index score Percentile  rank    Trials 1-5 Correct 40 89 23    Delayed Recall Correct 28 84 14    Total Recall Correct 79 88 21      CVLT-3 SF Score Raw score Scaled score Percentile rank    Short Delay Free Recall Correct 7 7 16     Short Delay Cued Recall Correct 7 6 9     Long Delay Free Recall Correct 9 8 25     Long Delay Cued Recall Correct 8 7 16      Score Sum of raw scores Standard score Percentile rank    Total Recall Responses 92 89 23     Score Raw score Base rate    Total Correct/Total Correct + Intrusions Ratio 0.9 40.0    Total Correct/Total Correct + Total Target Repetitions Ratio 0.9 52.0     Description of Test Results:   Intellectual Functioning  Premorbid verbal intellectual abilities were estimated to have been at least average based on educational and occupational history. [name] obtained a WAIS-IV Full-Scale IQ of 12 which places her at the below average range of intellectual functioning (10th percentile).  Her Verbal Comprehension Index (VCI) score was in the average range (27th percentile) while her Perceptual Reasoning Index (PRI) score was in the borderline impaired range (4th percentile) for her age.    Attention and Information Processing Speed The patient was able to repeat up to 5 digits forward, backward, and in sequence on a measure of attention span, which were average overall for her age (Digit Span=37th percentile). She scored below average on a measure of mental computation (Arithmetic=16th percentile). Basic auditory attention was relatively weaker than more complex working memory. Visual working memory was average (Symbol Span=37th percentile).  She performed below average (Symbol Search=9th percentile on a test involving horizontal visual scanning and and discrimination. In contrast , performance improved on another information processing speed tasks involving symbol copying as well as vertical scanning and discrimination (Coding=50th  percentile).  Language As previously discussed, her Verbal Comprehension Index (VCI) score was in the average range (27th percentile). Within the verbal domain, the patient obtained average range scores on measures of vocabulary (Vocabulary=50th percentile) and  verbal abstraction (Similarities=25th percentile), but below average on a test of general knowledge (Information=16th  percentile)   Visual Perceptual/Spatial Skills The patient's visual perception was relatively reduced and significant below verbal abilities. On perceptual reasoning subtests, the patient performed in the borderline impaired range on measures of visuoconstructional = 5th percentile) and non-verbal reasoning (Matrix Reasoning=5th percentile). Performance on a timed measure of visual-spatial analysis and integration was below average (Visual Puzzles=9th percentile).    Learning and Memory On measures of auditory verbal memory, the patient's immediate recall of story details was in the average range for her age (Logical Memory I=37th percentile).  She retained 91% of the information after a 20-25-minute delay, which was again an average range (LM II=50th percentile); Recognition was above average.    On a list learning and memory task, she recalled a total of 40/80 word pairs across 5 trials, showing slightly flattened  learning curve and below average encoding and acquisition of non-contextual information. After a brief  distracter task, free recall remained below average. Short delay cued recall was below average (recalled 7/16 words). After a delay, free recall improved to average (recalled 9/16 words). Performance on a yes/no recognition task dropped to below average.    The patient's recall of simple line figures immediately after presentation was in the below average range (VR-I=9th percentile). Her performance improved to the average range after a 20-25 minute delay (VR II=25th percentile). Recognition of these  line figures was also average (VR-II Recognition=51st-75th percentile).   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 However, there are a few areas of mild impairment suggestive of mild fronto-subcortical dysfunction (e.g., variable encoding abilities with intact consolidation). Her testing results do not warrant a diagnosis of dementia; however, a diagnosis mild cognitive impairment is appropriate at this time. She is still able to work full time and perform all activities of daily living.   Based on her medical history including multiple vascular risk factors, and recent neuroimaging demonstrating mild periventricular changes associated with chronic microvascular ischemic changes,  a vascular etiology is suspected. Her brother died unexpectedly around 2 years ago and she still appears to be grieving this loss; reportedly believes memory problems were present before but admitted this made things worse. She has been treated for depression via Wellbtriun for many years and was prescribed Klonopin as needed to treat sleep disturbance from 2013-2020; denied change in memory after stopping Klonopin. She reportedly stopped taking a statin drug for a similar duration of time that did not make much impact; she currently takes Crestor to manage cholesterol.  Recommendations: Based on the findings of the present evaluation, the following recommendations are offered:  1. Optimal control of vascular risk factors is necessary to reduce the risk of further cognitive decline.  2. Based on her test performances, the patient will likely benefit from repetition of to-be-remembered information.  3. She may benefit from grief counseling or other form of individual psychotherapy to address  depression and anxiety.  4. Neuropsychological re-assessment in one year (or sooner if there is a change in cognition or behavior) is recommended in order to monitor cognitive status, track any progression of symptoms and further assist with treatment planning.

## 2020-03-24 MED ORDER — EPINEPHRINE 0.3 MG/0.3ML IJ SOAJ: 0.3000 mg | Freq: Once | INTRAMUSCULAR | 1 refills | Status: AC

## 2020-03-24 NOTE — Addendum Note (Signed)
Addended by: Osa Craver on: 03/24/2020 11:44 AM   Modules accepted: Orders

## 2020-03-29 ENCOUNTER — Encounter: Payer: Self-pay | Admitting: Psychology

## 2020-03-29 ENCOUNTER — Other Ambulatory Visit: Payer: Self-pay

## 2020-03-29 ENCOUNTER — Encounter: Payer: Federal, State, Local not specified - PPO | Admitting: Psychology

## 2020-03-29 DIAGNOSIS — F0150 Vascular dementia without behavioral disturbance: Secondary | ICD-10-CM | POA: Diagnosis not present

## 2020-03-29 DIAGNOSIS — G3184 Mild cognitive impairment, so stated: Secondary | ICD-10-CM | POA: Diagnosis not present

## 2020-03-29 DIAGNOSIS — F01A0 Vascular dementia, mild, without behavioral disturbance, psychotic disturbance, mood disturbance, and anxiety: Secondary | ICD-10-CM

## 2020-03-29 NOTE — Progress Notes (Signed)
   Neuropsychology Note  [name] completed 120 minutes of neuropsychological testing with this provider. The patient did not appear overtly distressed by the testing session, per behavioral observation or via self-report. Rest breaks were offered.   Clinical Decision Making: In considering the patient's current level of functioning, level of presumed impairment, nature of symptoms, emotional and behavioral responses during the interview, level of literacy, and observed level of motivation/effort, additional tests were selected and completed by patient during today's testing appointment. Changes were made as deemed necessary based on patient performance on testing, behavioral observations, and additional pertinent factors such as those listed above.  [name] will return for another interactive feedback session with this provider within 2 weeks at which time her full test performances, clinical impressions and treatment recommendations will be reviewed in detail. The patient understands she can contact our office should she require our assistance before this time.  Full report to follow.

## 2020-04-08 ENCOUNTER — Encounter: Payer: Federal, State, Local not specified - PPO | Admitting: Psychology

## 2020-04-16 ENCOUNTER — Other Ambulatory Visit: Payer: Self-pay

## 2020-04-16 ENCOUNTER — Encounter: Payer: Self-pay | Admitting: Psychology

## 2020-04-16 ENCOUNTER — Ambulatory Visit: Payer: Federal, State, Local not specified - PPO | Admitting: Psychology

## 2020-04-16 ENCOUNTER — Encounter: Payer: Federal, State, Local not specified - PPO | Attending: Psychology | Admitting: Psychology

## 2020-04-16 DIAGNOSIS — F0150 Vascular dementia without behavioral disturbance: Secondary | ICD-10-CM | POA: Diagnosis not present

## 2020-04-16 DIAGNOSIS — F330 Major depressive disorder, recurrent, mild: Secondary | ICD-10-CM

## 2020-04-16 DIAGNOSIS — F01A0 Vascular dementia, mild, without behavioral disturbance, psychotic disturbance, mood disturbance, and anxiety: Secondary | ICD-10-CM

## 2020-04-18 NOTE — Progress Notes (Signed)
Neuropsychology Feedback Appointment  Krislyn Gorden HarmsK Flavell returned for a second feedback appointment today to review the final results of her neuropsychological evaluation (integrated with additional testing) with this provider. 60 minutes face-to-face time was spent reviewing her test results, my impressions and my recommendations as detailed in her report. Education was provided about her vascular risk factors and the impact chronic stress and depression have on cognitive functioning over time. The patient was given the opportunity to ask questions, and I did my best to answer these to her satisfaction.   Below is a copy of the final results, clinical impressions, and recommendations from the evaluation, dated 03/17/20 in her EMR. The initial consultation/interview was completed on 02/24/20. _______________________________________________________________________________________________  Test Results: Note: Standardized scores are presented only for use by appropriately trained professionals and to allow for any future test-retest comparison. These scores should not be interpreted without consideration of all the information that is contained in the rest of the report. The most recent standardization samples from the test publisher or other sources were used whenever possible to derive standard scores; scores were corrected for age, gender, ethnicity and education when available.   Test Scores:  TEST SCORES:  Note: This summary of test scores accompanies the interpretive report and should not be considered in isolation without reference to the appropriate sections in the text. Descriptors are based on appropriate normative data and may be adjusted based on clinical judgment. The terms "impaired" and "within normal limits (WNL)" are used when a more specific level of functioning cannot be determined.    Validity Testing:     Descriptor         WAIS-IV Reliable Digit Span: --- --- Within Expectation   CVLT-III Forced Choice Recognition: --- --- Within Expectation  RCFT Combination Effort Score: --- --- Within Expectation         Intellectual Functioning:               Wechsler Adult Intelligence Scale (WAIS-IV):  Standard Score/ Scaled Score Percentile    Full Scale IQ  81 10 Below Average  GAI 80 9 Below Average  Verbal Comprehension Index: 91 27 Average  Similarities  8 25 Average  Vocabulary 10 50 Average  Information  7 16 Below Average  Perceptual Reasoning Index:  73 4 Well Below Average  Block Design  5 5 Well Below Average  Matrix Reasoning  5 5 Well Below Average  Visual Puzzles 6 9 Below Average  Working Memory Index: 89 23 Below Average  Digit Span 9 37 Average  Arithmetic  7 16 Below Average  Processing Speed Index: 89 23 Below Average  Symbol Search  6 9 Below Average  Coding 10 50 Average         Wechsler Adult Intelligence Scale (WAIS-IV) Short Form*: Standard Score/ Scaled Score Percentile    Full Scale IQ  87 19 Below Average  Similarities 8 25 Average  Information  7 16 Below Average  Visual Puzzles 6 9 Below Average  Digit Span 9 37 Average  Coding 10 50 Average  *From [employer] (2009)             Wechsler Adult Intelligence Scale (WAIS-IV) Short Form*: Standard Score/ Scaled Score Percentile    Full Scale IQ 89 22 Below Average  Similarities 8 25 Average  Digit Span 9 37 Average  Vocabulary 10 50 Average  Arithmetic 7 16 Below Average  Information 7 16 Below Average  *From [employer] et al[employer] (2020)  Memory:               Wechsler Memory Scale (WMS-IV):                       Raw Score (Scaled Score) Percentile    Logical Memory I 22/50 (9) 37 Average  Logical Memory II 20/50 (10) 50 Average  Logical Memory Recognition 26/30 >75 Above Average         [place] Verbal Learning Test (CVLT-III), Standard Form: Raw Score (Scaled/Standard Score) Percentile    Total Trials 1-5 40/80 (89) 23 Below Average  List B 6/16 (11) 63 Average   Short-Delay Free Recall 7/16 (7) 16 Below Average  Short-Delay Cued Recall 7/16 (6) 9 Below Average  Long Delay Free Recall 9/16 (8) 25 Average  Long Delay Cued Recall 8/16 (7) 16 Below Average  Discriminability 1.6 (5) 5 Well Below Average  Recognition Hits 11/16 (5) 5 Well Below Average  False Positive Errors 4 (8) 25 Average         Wechsler Memory Scale (WMS-IV):                       Raw Score (Scaled Score) Percentile    Visual Reproduction I 25/43 (6) 9 Below Average  Visual Reproduction II 17/43 (8) 25 Average  Visual Reproduction Recognition 6/7 51-75 Average         Rey-Osterrieth Complex Figure Test (RCFT): Raw Score (T Score) Percentile    Immediate Recall 11/36 (36) 8 Well Below Average  Delayed Recall 12/36 (37) 9 Below Average  Recognition Total Correct 19/24 (42) 21 Below Average  True Positives 7 11-16 Below Average  False Positives 0 >16 Within Normal Limits         Attention/Executive Function:                 Scaled Score Percentile    WAIS-IV Coding: 10 50 Average           Scaled Score Percentile    WAIS-IV Digit Span: 9 37 Average  Forward 8 25 Average  Backward 12 75 Above Average  Sequencing 9 37 Average           Age-Scaled Score Percentile    Wechsler Memory Scale (WMS-IV) Symbol Span: 9 37 Average           Scaled Score Percentile    WAIS-IV Similarities: 8 25 Average         [place] Card Sorting Test: Raw Score Percentile    Categories (trials) 2 (128) 6-10 Well Below Average-Below Average  Total Errors 82 <1 Exceptionally Low  % Perseverative Errors 27 4 Well Below Average  % Non-Perseverative Errors 38 <1 Exceptionally Low  Failure to Maintain Set 0 >16 Within Normal Limits  Learning to Learn -33.44 ?1 Exceptionally Low         Language:                Verbal Fluency Test: Raw Score (T Score) Percentile    Phonemic Fluency (FAS) 42 (50) 50 Average  Animal Fluency 17 (47) 38 Average           Raw Score (T Score) Percentile     [place] Naming Test (BNT): 51/60 (42) 21 Below Average         Visuospatial/Visuoconstruction:          Raw Score Percentile    RCFT, Copy: 34/36 >16 Within Normal Limits  RBANS Line  Orientation, Form A: 10/20 3-9 Well Below Average           Scaled Score Percentile    WAIS-IV Block Design: 5 5 Well Below Average  WAIS-IV Matrix Reasoning: 5 5 Well Below Average  WAIS-IV Visual Puzzles: 6 9 Below Average         Mood and Personality:          Raw Score Percentile    Beck Depression Inventory - II: 12 --- Within Normal Limits  Beck Anxiety Inventory: 3 --- Within Normal Limits   Description of Test Results:   Intellectual Functioning: Premorbid verbal intellectual abilities were estimated to have been at least average based on educational and occupational history. She obtained a WAIS-IV Full-Scale IQ of 80 which places her at the below average range of intellectual functioning (10th percentile). 5-subtest Short Form IQ scores were a little higher (19th and 22nd percentile) but also in this range. Her Verbal Comprehension Index (VCI) score was average (27th percentile) while her Perceptual Reasoning Index (PRI) score was borderline impaired (4th percentile) for her age; this is a statistically significant difference.    Attention/Information Processing Speed/Executive Functioning: The patient was able to repeat up to 5 digits forward, backward, and in sequence on a measure of attention span, which were average overall for her age (Digit Span=37th percentile). She scored below average on a measure of mental computation (Arithmetic=16th percentile). Basic auditory attention was relatively weaker than more complex working memory. Visual working memory was average (Symbol Span=37th percentile).  She performed below average (Symbol Search=9th percentile on a test involving horizontal visual scanning and and discrimination. In contrast , performance improved on another information processing speed  tasks involving symbol copying as well as vertical scanning and discrimination (Coding=50th percentile).  The patient's ability to identify, maintain, and shift problem solving strategies in response to examiner feedback was well below average.   Language: As previously discussed, her Verbal Comprehension Index (VCI) score was in the average range (27th percentile). Within the verbal domain, the patient obtained average range scores on measures of vocabulary (Vocabulary=50th percentile) and  verbal abstraction (Similarities=25th percentile), but below average on a test of general knowledge (Information=16th  percentile).   Confrontation naming was below average. Verbal fluency was grossly intact (e.g., lexical and semantic) and average for age, education, and race/ethnicity.    Visual Perceptual/Spatial Skills: The patient's visual perception was relatively reduced and significant below verbal abilities. On perceptual reasoning subtests, the patient performed in the borderline impaired range on measures of visuoconstructional Aeronautical engineer = 5th percentile) and non-verbal reasoning (Matrix Reasoning=5th percentile). Performance on a timed measure of visual-spatial analysis and integration was below average (Visual Puzzles=9th percentile).   Spatial judgement was relatively weak and well below average. Complex constructional praxis was average.   Learning and Memory: On measures of auditory verbal memory, the patient's immediate recall of story details was in the average range for her age (Logical Memory I=37th percentile).  She retained 91% of the information after a 20-25-minute delay, which was again an average range (LM II=50th percentile); Recognition was above average.    On a list learning and memory task, she recalled a total of 40/80 word pairs across 5 trials, showing slightly flattened  learning curve and below average encoding and acquisition of non-contextual information. After a brief  distracter task, free recall remained below average. Short delay cued recall was below average (recalled 7/16 words). After a delay, free recall improved to average (recalled 9/16 words). Performance  on a yes/no recognition task dropped to below average.    The patient's recall of simple line figures immediately after presentation was in the below average range (VR-I=9th percentile). Her performance improved to the average range after a 20-25 minute delay (VR II=25th percentile). Recognition of these line figures was also average (VR-II Recognition=51st-75th percentile).   Her immediate recall of a complex geometric figure was well below average. Performance improved to below average after 30 minute delay. Recognition was below average.   Clinical Impressions: Results of the current cognitive evaluation are generally within normal limits, with most areas of function consistent with estimated premorbid intellectual abilities. However, there are a few areas of mild impairment suggestive of mild fronto-subcortical dysfunction (e.g., variable encoding abilities with intact consolidation). Her testing results do not warrant a diagnosis of dementia; however, a diagnosis mild cognitive impairment is appropriate at this time. She is still able to work full time and perform all activities of daily living.   Based on her medical history including multiple vascular risk factors, and recent neuroimaging demonstrating mild periventricular changes associated with chronic microvascular ischemic changes, a vascular etiology is suspected. Her brother died unexpectedly around 2 years ago and still appears to be grieving this loss; reportedly believes memory problems were present before but admitted this she made things worse. She has struggled with depression and been treated via [place] for many years. She was also prescribed Klonopin as needed to treat sleep disturbance from 2013-2020; denied change in memory after  stopping Klonopin. She reportedly stopped taking a statin drug for a similar duration of time that did not make much impact; she currently takes Crestor to manage cholesterol. She denied any recent increase in depressed mood but did note having a lot of stress at work. She may benefit from individual psychotherapy in addition to pharmacotherapy to teach coping skills and strategies to manage stress and improve attention/memory.     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 Optimal control of vascular risk factors is necessary to reduce the risk of further cognitive decline.  2. Consider obtaining updated neuroimaging (i.e., brain MRI scan).  3. Based on her test performances, the patient will likely benefit from repetition of to-be-remembered information.  4. She may benefit from grief counseling or other form of individual psychotherapy to address depression and anxiety.  5. Try to keep in mind that common word finding errors are not necessarily the start of a dreadful decline. Over-focusing on these errors can contribute to further distraction and emotions that can detract from effective retrieval of words; this in turn, can lead to greater distress and more difficulties with recalling the specific word you were looking for to begin with.  6. Neuropsychological re-assessment in one year (or sooner if there is a change in cognition or behavior) is recommended in order to monitor cognitive status, track any progression of symptoms and further assist with treatment planning. 7. Nutritional factors can have a significant effect on psychological and emotional status, as well as overall brain functioning.  The following general recommendations have been associated  with improvements in depression and other psychological symptoms, as well as lower risk for dementia and other forms of cognitive impairment.  Please discuss these recommendations with your physician and/or dietitian before initiating:  . Consume a wide  variety of fresh fruits and vegetables, particularly including brightly colored items such as berries, oranges, tomatoes, peppers, carrots, broccoli, spinach, dark green lettuces, sweet potatoes, etc., all of which are high in vitamins and antioxidants.  . Consume foods that are high in fiber, such as legumes (e.g., beans, peas, lentils) and foods made from whole grains (e.g., whole wheat bread and pasta)  . Consume a significant amount of omega-3 essential fats and oils.  These can be found in natural food sources such as salmon and other fatty fish, and also products made from flax seed and flax seed oil.  Alternately, dietary supplementation with fish oil capsules and flax seed oil capsules is a good way to boost one's level of omega-3 consumption.  It is important to check with your doctor before taking these supplements, especially if you take blood thinning medication.  . If you do not already do so, consider taking a quality multivitamin supplement under the guidance of your primary care physician.   . Consider keeping consumption of the following foods to a minimum: 1) foods made from white flour and white sugar; 2) artificial sweeteners; 3) deep-fried foods; 4) animal fat other than fish; 5) any foods containing "hydrogenated" or "trans" fats; 6) most other types of highly processed packaged/prepared foods.   The following are several strategies that may help:  . Performance will generally be best in a structured, routine, and familiar environment, as opposed to situations involving complex problems.  Charlene Brooke a place to keep your keys, wallet, cell phone, and other personal belongings. . Take time to register and process information to be  remembered. Deeper encoding of information can be gained by forming a mental picture, making meaningful associations, connecting new information to previously learned and related information, paraphrasing and repetition.  . To the extent possible, multitasking should be avoided; break down tasks into smaller steps to help get started and to keep from feeling overwhelmed. And if there are difficulties in organization and planning, maintaining a daily organizer to help keep track of important appointments and information may be beneficial.   . Memory problems may at least be minimally addressed using compensatory strategies such as the use of a daily schedule to follow, memos, portable recorder, a centrally located bulletin board, or memory notebook. A large calendar, placed in a highly visible location would be valuable to keep track of dates and appointments.  In addition, it would be helpful to keep a log of all of medical appointments with the name of the doctor, date of visit, diagnoses, and treatments.  . Use of a medication box is recommended to ensure compliance and decrease confusion regarding medication dosages, times, and dates. . To aid in managing problems with attention, the patient may consider using some of the following strategies: o The patient should simplify tasks.  There may be a need to break overly complex activities into simple step-by-step tasks, keep these steps written down in a note book and then check them off as they are completed which will help to stay on task and make sure the whole task is finished.  o The patient should set deadlines for everything, even for seemingly small tasks, prioritize time-sensitive tasks and write down every assignment, message, or important thought. o The patient is encouraged to use timers and alarms to stay on track and take breaks at regular intervals. Avoid piles of paperwork or procrastination by dealing with each item as it comes  in.   ______________________________ Horton Finer,  PsyD Licensed Psychologist (Provisional)  Clinical Neuropsychologist

## 2020-04-22 DIAGNOSIS — N760 Acute vaginitis: Secondary | ICD-10-CM | POA: Diagnosis not present

## 2020-05-14 DIAGNOSIS — F419 Anxiety disorder, unspecified: Secondary | ICD-10-CM | POA: Diagnosis not present

## 2020-06-15 DIAGNOSIS — Z13820 Encounter for screening for osteoporosis: Secondary | ICD-10-CM | POA: Diagnosis not present

## 2020-07-20 ENCOUNTER — Ambulatory Visit: Payer: Federal, State, Local not specified - PPO | Admitting: Neurology

## 2020-07-26 DIAGNOSIS — E7800 Pure hypercholesterolemia, unspecified: Secondary | ICD-10-CM | POA: Diagnosis not present

## 2020-07-26 DIAGNOSIS — E1169 Type 2 diabetes mellitus with other specified complication: Secondary | ICD-10-CM | POA: Diagnosis not present

## 2020-07-26 DIAGNOSIS — I10 Essential (primary) hypertension: Secondary | ICD-10-CM | POA: Diagnosis not present

## 2020-07-26 DIAGNOSIS — F419 Anxiety disorder, unspecified: Secondary | ICD-10-CM | POA: Diagnosis not present

## 2020-10-13 ENCOUNTER — Encounter: Payer: Self-pay | Admitting: Internal Medicine

## 2020-10-13 ENCOUNTER — Telehealth: Payer: Self-pay | Admitting: Internal Medicine

## 2020-10-13 ENCOUNTER — Other Ambulatory Visit: Payer: Self-pay | Admitting: Internal Medicine

## 2020-10-13 ENCOUNTER — Other Ambulatory Visit (HOSPITAL_COMMUNITY): Payer: Self-pay

## 2020-10-13 ENCOUNTER — Telehealth: Payer: Self-pay | Admitting: Infectious Diseases

## 2020-10-13 DIAGNOSIS — U071 COVID-19: Secondary | ICD-10-CM | POA: Insufficient documentation

## 2020-10-13 MED ORDER — BENZONATATE 100 MG PO CAPS: 200.0000 mg | ORAL_CAPSULE | Freq: Three times a day (TID) | ORAL | 1 refills | Status: DC | PRN

## 2020-10-13 MED ORDER — PAXLOVID 10 X 150 MG & 10 X 100MG PO TBPK
2.0000 | ORAL_TABLET | Freq: Two times a day (BID) | ORAL | 0 refills | Status: DC
  Filled 2020-10-13: qty 20, 5d supply, fill #0

## 2020-10-13 NOTE — Telephone Encounter (Signed)
Employee with positive COVID test.  Will send in paxlovid.

## 2020-10-13 NOTE — Telephone Encounter (Signed)
Employee with COVID. Will send paxlovid to [facility] pharmacy.  Ask her to stop wellbutrin while on this rx and 48h after.

## 2020-10-13 NOTE — Telephone Encounter (Signed)
I received a phone call from [name] this afternoon.  She became ill with fever, muscle aches cough and headache several days ago and was diagnosed with COVID-19 infection.  She was prescribed Paxlovid by my partner, Dr. Enedina Finner, 3 days ago but just picked up the prescription today and has not started it yet.  She has been taking acetaminophen as needed for temperatures up to 102 degrees.  She has not been eating or drinking much.  She is bothered by coughing.  I instructed her to start the Paxlovid right away and use acetaminophen every 4-6 hours to help with her fever, headaches and muscle aches.  I will prescribe Tessalon Perles for her cough.

## 2020-10-24 ENCOUNTER — Other Ambulatory Visit: Payer: Self-pay | Admitting: Infectious Disease

## 2020-10-24 MED ORDER — GUAIFENESIN-CODEINE 100-10 MG/5ML PO SYRP: 5.0000 mL | ORAL_SOLUTION | Freq: Three times a day (TID) | ORAL | 1 refills | Status: DC | PRN

## 2020-10-24 NOTE — Progress Notes (Signed)
[name] gave me consent to go into Epic for her medical records.  [name] has had fairly rough time with COVID 19 infection with severe ongoing cough now even weeks later  Her PCP is not in town until Monday  She drove to [place] to [facility] to have obtain cheratussin by showing her drivers license as I had suggested but they refused to fill it for her because her address was not in [place].  I sent electronic prescription for this medicine I sent through epic as it was only way she could obtain this medicine promptly

## 2020-11-01 ENCOUNTER — Ambulatory Visit
Admission: RE | Admit: 2020-11-01 | Discharge: 2020-11-01 | Disposition: A | Discharge status: Home / Self Care | Payer: Federal, State, Local not specified - PPO | Source: Ambulatory Visit | Attending: Internal Medicine | Admitting: Internal Medicine

## 2020-11-01 ENCOUNTER — Other Ambulatory Visit: Payer: Self-pay | Admitting: Internal Medicine

## 2020-11-01 DIAGNOSIS — R059 Cough, unspecified: Secondary | ICD-10-CM

## 2020-11-01 DIAGNOSIS — U071 COVID-19: Secondary | ICD-10-CM | POA: Diagnosis not present

## 2020-12-21 ENCOUNTER — Ambulatory Visit: Payer: Federal, State, Local not specified - PPO | Admitting: Psychology

## 2021-02-08 DIAGNOSIS — E7800 Pure hypercholesterolemia, unspecified: Secondary | ICD-10-CM | POA: Diagnosis not present

## 2021-02-08 DIAGNOSIS — I10 Essential (primary) hypertension: Secondary | ICD-10-CM | POA: Diagnosis not present

## 2021-02-08 DIAGNOSIS — E1169 Type 2 diabetes mellitus with other specified complication: Secondary | ICD-10-CM | POA: Diagnosis not present

## 2021-02-08 DIAGNOSIS — Z0000 Encounter for general adult medical examination without abnormal findings: Secondary | ICD-10-CM | POA: Diagnosis not present

## 2021-02-08 DIAGNOSIS — R413 Other amnesia: Secondary | ICD-10-CM | POA: Diagnosis not present

## 2021-02-28 DIAGNOSIS — E119 Type 2 diabetes mellitus without complications: Secondary | ICD-10-CM | POA: Diagnosis not present

## 2021-03-17 DIAGNOSIS — Z01419 Encounter for gynecological examination (general) (routine) without abnormal findings: Secondary | ICD-10-CM | POA: Diagnosis not present

## 2021-03-17 DIAGNOSIS — Z1231 Encounter for screening mammogram for malignant neoplasm of breast: Secondary | ICD-10-CM | POA: Diagnosis not present

## 2021-06-16 DIAGNOSIS — M79662 Pain in left lower leg: Secondary | ICD-10-CM | POA: Diagnosis not present

## 2021-06-17 DIAGNOSIS — M79662 Pain in left lower leg: Secondary | ICD-10-CM | POA: Diagnosis not present

## 2021-07-04 ENCOUNTER — Other Ambulatory Visit: Payer: Self-pay | Admitting: Nurse Practitioner

## 2021-07-04 DIAGNOSIS — N644 Mastodynia: Secondary | ICD-10-CM | POA: Diagnosis not present

## 2021-07-05 DIAGNOSIS — H40013 Open angle with borderline findings, low risk, bilateral: Secondary | ICD-10-CM | POA: Diagnosis not present

## 2021-07-06 ENCOUNTER — Ambulatory Visit
Admission: RE | Admit: 2021-07-06 | Discharge: 2021-07-06 | Disposition: A | Discharge status: Home / Self Care | Payer: Federal, State, Local not specified - PPO | Source: Ambulatory Visit | Attending: Nurse Practitioner | Admitting: Nurse Practitioner

## 2021-07-06 ENCOUNTER — Ambulatory Visit: Payer: Federal, State, Local not specified - PPO

## 2021-07-06 DIAGNOSIS — N644 Mastodynia: Secondary | ICD-10-CM

## 2021-07-06 DIAGNOSIS — R922 Inconclusive mammogram: Secondary | ICD-10-CM | POA: Diagnosis not present

## 2021-07-28 ENCOUNTER — Ambulatory Visit: Payer: Federal, State, Local not specified - PPO | Admitting: Sports Medicine

## 2021-07-28 ENCOUNTER — Other Ambulatory Visit (HOSPITAL_COMMUNITY): Payer: Self-pay

## 2021-07-28 VITALS — BP 139/65 | Ht 62.5 in | Wt 167.0 lb

## 2021-07-28 DIAGNOSIS — M25562 Pain in left knee: Secondary | ICD-10-CM

## 2021-07-28 MED ORDER — MELOXICAM 15 MG PO TABS
ORAL_TABLET | ORAL | 0 refills | Status: DC
  Filled 2021-07-28: qty 40, 40d supply, fill #0

## 2021-07-28 MED ORDER — METHYLPREDNISOLONE ACETATE 40 MG/ML IJ SUSP
40.0000 mg | Freq: Once | INTRAMUSCULAR | Status: AC
  Administered 2021-07-28: 40 mg via INTRA_ARTICULAR

## 2021-07-28 NOTE — Progress Notes (Signed)
? ?  Subjective:  ? ? Patient ID: [name], female    DOB: 09-15-1960, 61 y.o.   MRN: [id number] ? ?HPI chief complaint: Left leg pain and swelling ? ?[name] is a very pleasant 61 year old female that comes in today complaining of approximately 1 month of intermittent left leg pain and swelling.  Her symptoms began acutely after a workout.  She did a 4 mile walk/run followed by some floor work which consisted of some pretty aggressive stretching.  Pretty soon after that, she began to notice swelling in the thigh which involved the knee and lower leg as well.  She was concerned about a DVT so a Doppler was ordered which was negative for DVT.  In addition to the swelling in the legs she does note some instability in the left knee as well as swelling in the knee joint.  She tried over-the-counter ibuprofen for 3 days but it was not helpful.  Her symptoms, including the swelling, seem to be worse with sitting.  She describes her pain as a tightness in the leg similar to what you would get if you had soft tissue swelling with an ankle sprain.  She does endorse some tingling in her toes but denies numbness or tingling throughout the rest of her leg.  She denies low back pain. ? ?Past medical history reviewed ?Medications reviewed ?Allergies reviewed ? ? ?Review of Systems ?As above ?   ?Objective:  ? Physical Exam ? ?Well-developed, well-nourished.  No acute distress ? ?Left leg: There is no obvious soft tissue swelling today.  No knee effusion.  No tenderness to palpation.  Positive straight leg raise on the left, negative on the right.  Strength is 5/5 in both lower extremities.  Reflexes are trace but equal at the Achilles and patellar tendons bilaterally.  No atrophy.  Knee is stable to ligamentous exam.  Negative McMurray's.  Neurovascularly intact distally. ? ? ?   ?Assessment & Plan:  ? ?Intermittent left leg/knee pain and swelling of unknown etiology ? ?I recommended a diagnostic/therapeutic left knee cortisone  injection.  Patient is in agreement with this.  This is accomplished atraumatically under sterile technique utilizing an anterior lateral approach.  I would also like to start meloxicam 15 mg daily for 7 days then as needed.  Follow-up with me again in 2 to 3 weeks for reevaluation.  I have asked her to take pictures of her lower leg swelling prior to her follow-up visit. ? ?This note was dictated using Dragon naturally speaking software and may contain errors in syntax, spelling, or content which have not been identified prior to signing this note.  ? ?

## 2021-08-18 ENCOUNTER — Ambulatory Visit: Payer: Federal, State, Local not specified - PPO | Admitting: Sports Medicine

## 2021-08-19 DIAGNOSIS — R195 Other fecal abnormalities: Secondary | ICD-10-CM | POA: Diagnosis not present

## 2021-08-19 DIAGNOSIS — E1169 Type 2 diabetes mellitus with other specified complication: Secondary | ICD-10-CM | POA: Diagnosis not present

## 2021-08-30 ENCOUNTER — Ambulatory Visit (INDEPENDENT_AMBULATORY_CARE_PROVIDER_SITE_OTHER): Payer: Federal, State, Local not specified - PPO | Admitting: Sports Medicine

## 2021-08-30 VITALS — BP 136/77 | Ht 63.0 in | Wt 162.0 lb

## 2021-08-30 DIAGNOSIS — M25562 Pain in left knee: Secondary | ICD-10-CM

## 2021-08-31 NOTE — Progress Notes (Signed)
   Subjective:    Patient ID: [name], female    DOB: 1960-10-04, 61 y.o.   MRN: [id number]  HPI  [name] presents today for follow-up on left leg pain and swelling.  Her symptoms all resolved shortly after she received her diagnostic/therapeutic intra-articular left knee injection.  She only took 1 dose of the meloxicam.  She has not experienced any swelling since then.   Review of Systems As above    Objective:   Physical Exam  Well-developed, well-nourished.  No acute distress  Left knee: Full range of motion.  There is no effusion.  Lower leg shows no evidence of soft tissue swelling.  She is ambulating without a limp.      Assessment & Plan:   Resolved left leg swelling  Given her positive response to a diagnostic/therapeutic left knee injection, it looks like all of her symptoms may have been originating from her left knee.  It is possible that she had a slowly draining Baker's cyst which was the cause of her previous swelling.  Nonetheless, she is improved dramatically.  She will resume activity as tolerated.  We did educate her in quadriceps and hip abductor strengthening exercises to start doing.  Follow-up as needed.  This note was dictated using Dragon naturally speaking software and may contain errors in syntax, spelling, or content which have not been identified prior to signing this note.

## 2021-09-19 ENCOUNTER — Encounter: Payer: Self-pay | Admitting: Infectious Diseases

## 2022-01-04 DIAGNOSIS — H40013 Open angle with borderline findings, low risk, bilateral: Secondary | ICD-10-CM | POA: Diagnosis not present

## 2022-02-24 DIAGNOSIS — Z0000 Encounter for general adult medical examination without abnormal findings: Secondary | ICD-10-CM | POA: Diagnosis not present

## 2022-02-24 DIAGNOSIS — F419 Anxiety disorder, unspecified: Secondary | ICD-10-CM | POA: Diagnosis not present

## 2022-02-24 DIAGNOSIS — E1169 Type 2 diabetes mellitus with other specified complication: Secondary | ICD-10-CM | POA: Diagnosis not present

## 2022-02-24 DIAGNOSIS — I10 Essential (primary) hypertension: Secondary | ICD-10-CM | POA: Diagnosis not present

## 2022-02-24 DIAGNOSIS — E7800 Pure hypercholesterolemia, unspecified: Secondary | ICD-10-CM | POA: Diagnosis not present

## 2022-03-22 ENCOUNTER — Encounter: Payer: Self-pay | Admitting: Internal Medicine

## 2022-03-30 ENCOUNTER — Telehealth: Payer: Self-pay

## 2022-03-30 DIAGNOSIS — Z01419 Encounter for gynecological examination (general) (routine) without abnormal findings: Secondary | ICD-10-CM | POA: Diagnosis not present

## 2022-03-30 DIAGNOSIS — Z1231 Encounter for screening mammogram for malignant neoplasm of breast: Secondary | ICD-10-CM | POA: Diagnosis not present

## 2022-03-30 NOTE — Patient Instructions (Signed)
Visit Information  Thank you for taking time to visit with me today. Please don't hesitate to contact me if I can be of assistance to you.   Following are the goals we discussed today:   Goals Addressed             This Visit's Progress    COMPLETED: Care Coordination Activities - no follow up required       Care Coordination Interventions: Advised patient to check her B/P at home regularly Provided education to patient re: care coordination services Assessed social determinant of health barriers          If you are experiencing a Mental Health or Behavioral Health Crisis or need someone to talk to, please call the Suicide and Crisis Lifeline: 988 call the [place] National Suicide Prevention Lifeline: (531)450-1480 or TTY: 707-153-9284 TTY (954)205-0170) to talk to a trained counselor call 1-800-273-TALK (toll free, 24 hour hotline) go to [facility] [address] [place] [phone]) call 911   Patient verbalizes understanding of instructions and care plan provided today and agrees to view in MyChart. Active MyChart status and patient understanding of how to access instructions and care plan via MyChart confirmed with patient.     No further follow up required:    Dudley Major RN, Maximiano Coss, CDE Care Management Coordinator [facility] Care Management [phone]

## 2022-03-30 NOTE — Patient Outreach (Signed)
  Care Coordination   Initial Visit Note   03/30/2022 Name: [name] MRN: [id number] DOB: June 20, 1960  [name] is a 61 y.o. year old female who sees Pol[facility], Windy Fast, MD for primary care. I spoke with  [name] by phone today.  What matters to the patients health and wellness today?  No concerns today.  States her CBGs have been good since she is no longer on metformin and only on Jardiance.  States she is planning of checking her B/P since her HCTZ was stopped.      Goals Addressed             This Visit's Progress    COMPLETED: Care Coordination Activities - no follow up required       Care Coordination Interventions: Advised patient to check her B/P at home regularly Provided education to patient re: care coordination services Assessed social determinant of health barriers          SDOH assessments and interventions completed:  Yes  SDOH Interventions Today    Flowsheet Row Most Recent Value  SDOH Interventions   Food Insecurity Interventions Intervention Not Indicated  Housing Interventions Intervention Not Indicated  Transportation Interventions Intervention Not Indicated  Utilities Interventions Intervention Not Indicated  Physical Activity Interventions Intervention Not Indicated        Care Coordination Interventions:  Yes, provided   Follow up plan: No further intervention required.   Encounter Outcome:  Pt. Visit Completed  Dudley Major RN, BSN,CCM, CDE Care Management Coordinator [facility] Care Management [phone]

## 2022-04-24 ENCOUNTER — Encounter: Payer: Self-pay | Admitting: Physician Assistant

## 2022-04-24 ENCOUNTER — Telehealth: Payer: Federal, State, Local not specified - PPO | Admitting: Physician Assistant

## 2022-04-24 DIAGNOSIS — B9689 Other specified bacterial agents as the cause of diseases classified elsewhere: Secondary | ICD-10-CM

## 2022-04-24 DIAGNOSIS — J208 Acute bronchitis due to other specified organisms: Secondary | ICD-10-CM | POA: Diagnosis not present

## 2022-04-24 MED ORDER — BENZONATATE 100 MG PO CAPS: 100.0000 mg | ORAL_CAPSULE | Freq: Three times a day (TID) | ORAL | 0 refills | Status: DC | PRN

## 2022-04-24 MED ORDER — AZITHROMYCIN 250 MG PO TABS: ORAL_TABLET | ORAL | 0 refills | Status: AC

## 2022-04-24 MED ORDER — PREDNISONE 20 MG PO TABS: 40.0000 mg | ORAL_TABLET | Freq: Every day | ORAL | 0 refills | Status: DC

## 2022-04-24 NOTE — Patient Instructions (Signed)
Laverle Patter, thank you for joining [name], PA-C for today's virtual visit.  While this provider is not your primary care provider (PCP), if your PCP is located in our provider database this encounter information will be shared with them immediately following your visit.   [facility] account gives you access to today's visit and all your visits, tests, and labs performed at [facility] " click here if you don't have a [facility] account or go to [email or web address]  Consent: (Patient) [name] provided verbal consent for this virtual visit at the beginning of the encounter.  Current Medications:  Current Outpatient Medications:    azithromycin (ZITHROMAX) 250 MG tablet, Take 2 tablets on day 1, then 1 tablet daily on days 2 through 5, Disp: 6 tablet, Rfl: 0   benzonatate (TESSALON) 100 MG capsule, Take 1 capsule (100 mg total) by mouth 3 (three) times daily as needed for cough., Disp: 30 capsule, Rfl: 0   predniSONE (DELTASONE) 20 MG tablet, Take 2 tablets (40 mg total) by mouth daily with breakfast., Disp: 10 tablet, Rfl: 0   amLODipine (NORVASC) 5 MG tablet, Take 5 mg by mouth daily., Disp: , Rfl:    buPROPion (WELLBUTRIN XL) 300 MG 24 hr tablet, TAKE 1 TABLET BY MOUTH EVERY DAY IN THE MORNING FOR 90 DAYS, Disp: , Rfl:    clonazePAM (KLONOPIN) 0.5 MG tablet, Take 0.5 mg by mouth at bedtime as needed for anxiety. , Disp: , Rfl:    estradiol (VIVELLE-DOT) 0.05 MG/24HR patch, Place 1 patch onto the skin 2 (two) times a week., Disp: , Rfl:    JARDIANCE 10 MG TABS tablet, Take 10 mg by mouth daily., Disp: , Rfl:    rosuvastatin (CRESTOR) 10 MG tablet, Take 10 mg by mouth at bedtime., Disp: , Rfl:    Medications ordered in this encounter:  Meds ordered this encounter  Medications   benzonatate (TESSALON) 100 MG capsule    Sig: Take 1 capsule (100 mg total) by mouth 3 (three) times daily as needed for cough.    Dispense:  30 capsule     Refill:  0    Order Specific Question:   Supervising Provider    Answer:   Chase Picket [[id number]]   azithromycin (ZITHROMAX) 250 MG tablet    Sig: Take 2 tablets on day 1, then 1 tablet daily on days 2 through 5    Dispense:  6 tablet    Refill:  0    Order Specific Question:   Supervising Provider    Answer:   Chase Picket [[id number]]   predniSONE (DELTASONE) 20 MG tablet    Sig: Take 2 tablets (40 mg total) by mouth daily with breakfast.    Dispense:  10 tablet    Refill:  0    Order Specific Question:   Supervising Provider    Answer:   Chase Picket [id number]     *If you need refills on other medications prior to your next appointment, please contact your pharmacy*  Follow-Up: Call back or seek an in-person evaluation if the symptoms worsen or if the condition fails to improve as anticipated.  [facility] [phone]  Other Instructions Take antibiotic (Azithromycin) as directed.  Increase fluids.  Get plenty of rest. Use Mucinex for congestion. Tessalon as directed for cough. Start the prednisone if cough not controlled with inhaler and the Tessalon. Take a daily probiotic (I recommend Align or Culturelle,  but even Activia Yogurt may be beneficial).  A humidifier placed in the bedroom may offer some relief for a dry, scratchy throat of nasal irritation.  Read information below on acute bronchitis. Please call or return to clinic if symptoms are not improving.  Acute Bronchitis Bronchitis is when the airways that extend from the windpipe into the lungs get red, puffy, and painful (inflamed). Bronchitis often causes thick spit (mucus) to develop. This leads to a cough. A cough is the most common symptom of bronchitis. In acute bronchitis, the condition usually begins suddenly and goes away over time (usually in 2 weeks). Smoking, allergies, and asthma can make bronchitis worse. Repeated episodes of bronchitis may cause more lung problems.  HOME  CARE Rest. Drink enough fluids to keep your pee (urine) clear or pale yellow (unless you need to limit fluids as told by your doctor). Only take over-the-counter or prescription medicines as told by your doctor. Avoid smoking and secondhand smoke. These can make bronchitis worse. If you are a smoker, think about using nicotine gum or skin patches. Quitting smoking will help your lungs heal faster. Reduce the chance of getting bronchitis again by: Washing your hands often. Avoiding people with cold symptoms. Trying not to touch your hands to your mouth, nose, or eyes. Follow up with your doctor as told.  GET HELP IF: Your symptoms do not improve after 1 week of treatment. Symptoms include: Cough. Fever. Coughing up thick spit. Body aches. Chest congestion. Chills. Shortness of breath. Sore throat.  GET HELP RIGHT AWAY IF:  You have an increased fever. You have chills. You have severe shortness of breath. You have bloody thick spit (sputum). You throw up (vomit) often. You lose too much body fluid (dehydration). You have a severe headache. You faint.  MAKE SURE YOU:  Understand these instructions. Will watch your condition. Will get help right away if you are not doing well or get worse. Document Released: 09/13/2007 Document Revised: 11/27/2012 Document Reviewed: 09/17/2012 [facility] Patient Information 2015 [place], [place]. This information is not intended to replace advice given to you by your health care provider. Make sure you discuss any questions you have with your health care provider.    If you have been instructed to have an in-person evaluation today at a local Urgent Care facility, please use the link below. It will take you to a list of all of our available [facility], including address, phone number and hours of operation. Please do not delay care.  [facility]  If you or a family member do not have a primary care provider, use the  link below to schedule a visit and establish care. When you choose a [facility] primary care physician or advanced practice provider, you gain a long-term partner in health. Find a Primary Care Provider  Learn more about 's in-office and virtual care options: [facility]

## 2022-04-24 NOTE — Progress Notes (Signed)
Virtual Visit Consent   [name], you are scheduled for a virtual visit with a [facility] provider today. Just as with appointments in the office, your consent must be obtained to participate. Your consent will be active for this visit and any virtual visit you may have with one of our providers in the next 365 days. If you have a MyChart account, a copy of this consent can be sent to you electronically.  As this is a virtual visit, video technology does not allow for your provider to perform a traditional examination. This may limit your provider's ability to fully assess your condition. If your provider identifies any concerns that need to be evaluated in person or the need to arrange testing (such as labs, EKG, etc.), we will make arrangements to do so. Although advances in technology are sophisticated, we cannot ensure that it will always work on either your end or our end. If the connection with a video visit is poor, the visit may have to be switched to a telephone visit. With either a video or telephone visit, we are not always able to ensure that we have a secure connection.  By engaging in this virtual visit, you consent to the provision of healthcare and authorize for your insurance to be billed (if applicable) for the services provided during this visit. Depending on your insurance coverage, you may receive a charge related to this service.  I need to obtain your verbal consent now. Are you willing to proceed with your visit today? [name] has provided verbal consent on 04/24/2022 for a virtual visit (video or telephone). [name], [place]  Date: 04/24/2022 11:11 AM  Virtual Visit via Video Note   I, [name], connected with  [name]  ([id number], Jun 23, 1960) on 04/24/22 at 11:00 AM EST by a video-enabled telemedicine application and verified that I am speaking with the correct person using two identifiers.  Location: Patient: Virtual Visit Location  Patient: Home Provider: Virtual Visit Location Provider: Home Office   I discussed the limitations of evaluation and management by telemedicine and the availability of in person appointments. The patient expressed understanding and agreed to proceed.    History of Present Illness: [name] is a 62 y.o. who identifies as a female who was assigned female at birth, and is being seen today for URI symptoms ongoing for the past 2 weeks, initially diagnosed and treated for influenza with Tamiflu right after christmas. Initially improved but over past week or so, noting increase in cough that is persistent and now productive. Denies fever, chills. Some SOB secondary to coughing spells. This is alleviated with her albuterol.  Theraflu, Nyquil, Delsym OTC.   HPI: HPI  Problems:  Patient Active Problem List   Diagnosis Date Noted   COVID-19 10/13/2020   Mild cognitive impairment 07/25/2017   Diabetes mellitus ([place]) 11/04/2013   Anxiety state, unspecified 11/04/2013   Chronic abdominal pain 11/04/2013   Essential hypertension, benign 11/04/2013    Allergies:  Allergies  Allergen Reactions   Amoxicillin Anaphylaxis    Has patient had a PCN reaction causing immediate rash, facial/tongue/throat swelling, SOB or lightheadedness with hypotension: Yes Has patient had a PCN reaction causing severe rash involving mucus membranes or skin necrosis: No Has patient had a PCN reaction that required hospitalization: Yes Has patient had a PCN reaction occurring within the last 10 years: No If all of the above answers are "NO", then may proceed with Cephalosporin use.  Other reaction(s): Other (See Comments) Has patient had a PCN reaction causing immediate rash, facial/tongue/throat swelling, SOB or lightheadedness with hypotension: Yes Has patient had a PCN reaction causing severe rash involving mucus membranes or skin necrosis: No Has patient had a PCN reaction that required hospitalization: Yes Has  patient had a PCN reaction occurring within the last 10 years: No If all of the above answers are "NO", then may proceed with Cephalosporin use.   Strawberry Extract Shortness Of Breath, Itching and Swelling    Other reaction(s): Other (See Comments)   Tetracycline Anaphylaxis   Tetracyclines & Related Nausea And Vomiting and Rash    Other reaction(s): Other (See Comments)   Fluconazole Photosensitivity   Ibuprofen Other (See Comments)    Acute renal failure Other reaction(s): Other Acute renal failure Acute renal failure    Other Nausea And Vomiting    Other reaction(s): Other (See Comments)   Medications:  Current Outpatient Medications:    amLODipine (NORVASC) 5 MG tablet, Take 5 mg by mouth daily., Disp: , Rfl:    buPROPion (WELLBUTRIN XL) 300 MG 24 hr tablet, TAKE 1 TABLET BY MOUTH EVERY DAY IN THE MORNING FOR 90 DAYS, Disp: , Rfl:    clonazePAM (KLONOPIN) 0.5 MG tablet, Take 0.5 mg by mouth at bedtime as needed for anxiety. , Disp: , Rfl:    estradiol (VIVELLE-DOT) 0.05 MG/24HR patch, Place 1 patch onto the skin 2 (two) times a week., Disp: , Rfl:    JARDIANCE 10 MG TABS tablet, Take 10 mg by mouth daily., Disp: , Rfl:    rosuvastatin (CRESTOR) 10 MG tablet, Take 10 mg by mouth at bedtime., Disp: , Rfl:   Observations/Objective: Patient is well-developed, well-nourished in no acute distress.  Resting comfortably at home.  Head is normocephalic, atraumatic.  No labored breathing. Speech is clear and coherent with logical content.  Patient is alert and oriented at baseline.   Assessment and Plan: 1. Acute bacterial bronchitis  Rx Azithromycin and Tessalon.  Increase fluids.  Rest.  Saline nasal spray.  Probiotic.  Mucinex as directed.  Humidifier in bedroom. Ok to continue Albuterol. Start prednisone and minimize carbs/check glucose if cough medication and albuterol together are not controlling cough/bronchospasm.  Call or return to clinic if symptoms are not  improving.   Follow Up Instructions: I discussed the assessment and treatment plan with the patient. The patient was provided an opportunity to ask questions and all were answered. The patient agreed with the plan and demonstrated an understanding of the instructions.  A copy of instructions were sent to the patient via MyChart unless otherwise noted below.   The patient was advised to call back or seek an in-person evaluation if the symptoms worsen or if the condition fails to improve as anticipated.  Time:  I spent 10 minutes with the patient via telehealth technology discussing the above problems/concerns.    Charlene Rio, PA-C

## 2022-05-02 DIAGNOSIS — J101 Influenza due to other identified influenza virus with other respiratory manifestations: Secondary | ICD-10-CM | POA: Diagnosis not present

## 2022-05-04 ENCOUNTER — Ambulatory Visit (AMBULATORY_SURGERY_CENTER): Payer: Federal, State, Local not specified - PPO | Admitting: *Deleted

## 2022-05-04 ENCOUNTER — Other Ambulatory Visit (HOSPITAL_COMMUNITY): Payer: Self-pay

## 2022-05-04 VITALS — Ht 62.5 in | Wt 164.0 lb

## 2022-05-04 DIAGNOSIS — Z1211 Encounter for screening for malignant neoplasm of colon: Secondary | ICD-10-CM

## 2022-05-04 MED ORDER — HYDROCODONE BIT-HOMATROP MBR 5-1.5 MG/5ML PO SOLN
5.0000 mL | Freq: Four times a day (QID) | ORAL | 0 refills | Status: AC | PRN
  Filled 2022-05-04: qty 100, 5d supply, fill #0

## 2022-05-04 MED ORDER — NA SULFATE-K SULFATE-MG SULF 17.5-3.13-1.6 GM/177ML PO SOLN
1.0000 | Freq: Once | ORAL | 0 refills | Status: AC
  Filled 2022-05-04: qty 354, 1d supply, fill #0

## 2022-05-04 NOTE — Progress Notes (Signed)

## 2022-05-18 ENCOUNTER — Encounter: Payer: Self-pay | Admitting: Internal Medicine

## 2022-05-18 ENCOUNTER — Ambulatory Visit (AMBULATORY_SURGERY_CENTER): Payer: Federal, State, Local not specified - PPO | Admitting: Internal Medicine

## 2022-05-18 VITALS — BP 127/65 | HR 73 | Temp 97.8°F | Resp 22 | Ht 63.0 in | Wt 164.0 lb

## 2022-05-18 DIAGNOSIS — Z1211 Encounter for screening for malignant neoplasm of colon: Secondary | ICD-10-CM | POA: Diagnosis not present

## 2022-05-18 MED ORDER — SODIUM CHLORIDE 0.9 % IV SOLN: 500.0000 mL | Freq: Once | INTRAVENOUS | Status: DC

## 2022-05-18 NOTE — Op Note (Signed)
[facility] Patient Name: [name] Procedure Date: 05/18/2022 10:31 AM MRN: [id number] Endoscopist: Adline Mango [place] , , [id number] Age: 62 Referring MD:  Date of Birth: December 23, 1960 Gender: Female Account #: [id number] Procedure:                Colonoscopy Indications:              Screening for colorectal malignant neoplasm Medicines:                Monitored Anesthesia Care Procedure:                Pre-Anesthesia Assessment:                           - Prior to the procedure, a History and Physical                            was performed, and patient medications and                            allergies were reviewed. The patient's tolerance of                            previous anesthesia was also reviewed. The risks                            and benefits of the procedure and the sedation                            options and risks were discussed with the patient.                            All questions were answered, and informed consent                            was obtained. Prior Anticoagulants: The patient has                            taken no anticoagulant or antiplatelet agents. ASA                            Grade Assessment: II - A patient with mild systemic                            disease. After reviewing the risks and benefits,                            the patient was deemed in satisfactory condition to                            undergo the procedure.                           After obtaining informed consent, the colonoscope  was passed under direct vision. Throughout the                            procedure, the patient's blood pressure, pulse, and                            oxygen saturations were monitored continuously. The                            CF [id number] #[id number] was introduced through the anus                            and advanced to the the terminal ileum. The                            colonoscopy was  performed without difficulty. The                            patient tolerated the procedure well. The quality                            of the bowel preparation was good. The terminal                            ileum, ileocecal valve, appendiceal orifice, and                            rectum were photographed. Scope In: 10:53:55 AM Scope Out: 11:12:30 AM Scope Withdrawal Time: 0 hours 9 minutes 57 seconds  Total Procedure Duration: 0 hours 18 minutes 35 seconds  Findings:                 The terminal ileum appeared normal.                           Non-bleeding internal hemorrhoids were found during                            retroflexion.                           The exam was otherwise without abnormality. Complications:            No immediate complications. Estimated Blood Loss:     Estimated blood loss: none. Impression:               - The examined portion of the ileum was normal.                           - Non-bleeding internal hemorrhoids.                           - The examination was otherwise normal.                           - No specimens collected. Recommendation:           -  Discharge patient to home (with escort).                           - Repeat colonoscopy in 10 years for screening                            purposes.                           - The findings and recommendations were discussed                            with the patient. Dr Georgian Co "[name]" Lorenso Courier,  05/18/2022 11:17:19 AM

## 2022-05-18 NOTE — Progress Notes (Signed)
GASTROENTEROLOGY PROCEDURE H&P NOTE   Primary Care Physician: Seward Carol, MD    Reason for Procedure:   Colon cancer screening  Plan:    Colonoscopy  Patient is appropriate for endoscopic procedure(s) in the ambulatory ([place]) setting.  The nature of the procedure, as well as the risks, benefits, and alternatives were carefully and thoroughly reviewed with the patient. Ample time for discussion and questions allowed. The patient understood, was satisfied, and agreed to proceed.     HPI: [name] is a 62 y.o. female who presents for colonoscopy for colon cancer screening. Denies blood in stools, changes in bowel habits, or unintentional weight loss. Denies family history of colon cancer.  Past Medical History:  Diagnosis Date   Anemia    Anxiety    Borderline diabetic    Bowel obstruction (HCC)    Complication of anesthesia    aspirated after breast reduction surgery and had n/v with that surgery   Depression    Diabetes mellitus without complication ([place])    Ectopic pregnancy    Enteritis 11/04/2013   Hypercholesteremia    Hypertension    Intestinal adhesions with partial obstruction (HCC)    Mild cognitive impairment 07/25/2017   Partial small bowel obstruction ([place]) 11/04/2013   S/P small bowel resection 08/21/2017   Sleep disorder    Small bowel obstruction ([place]) 06/22/2017    Past Surgical History:  Procedure Laterality Date   ABDOMINAL HYSTERECTOMY  2003   APPENDECTOMY  08/21/2017   Procedure: APPENDECTOMY;  Surgeon: Jules Husbands, MD;  Location: [facility] ORS;  Service: General;;   BOWEL RESECTION  08/21/2017   Procedure: SMALL BOWEL RESECTION WITH PRIMARY ANASTOMOSIS;  Surgeon: Jules Husbands, MD;  Location: [facility] ORS;  Service: General;;   BREAST REDUCTION SURGERY Bilateral 1993   BREAST SURGERY     CESAREAN SECTION  2003   COLONOSCOPY     ECTOPIC PREGNANCY SURGERY     tubal removel   ESOPHAGOGASTRODUODENOSCOPY     EXPLORATORY LAPAROTOMY      LAPAROSCOPIC LYSIS OF ADHESIONS N/A 08/21/2017   Procedure: LAPAROSCOPIC LYSIS OF ADHESIONS CONVERTED TO OPEN;  Surgeon: Jules Husbands, MD;  Location: [facility] ORS;  Service: General;  Laterality: N/A;   TONSILLECTOMY     UTERINE FIBROID SURGERY      Prior to Admission medications   Medication Sig Start Date End Date Taking? Authorizing Provider  amLODipine (NORVASC) 5 MG tablet Take 5 mg by mouth daily.   Yes [provider]  buPROPion (WELLBUTRIN XL) 300 MG 24 hr tablet TAKE 1 TABLET BY MOUTH EVERY DAY IN THE MORNING FOR 90 DAYS   Yes [provider]  estradiol (VIVELLE-DOT) 0.05 MG/24HR patch Place 1 patch onto the skin 2 (two) times a week. 11/17/19  Yes [provider]  JARDIANCE 10 MG TABS tablet Take 10 mg by mouth daily.   Yes [provider]  rosuvastatin (CRESTOR) 10 MG tablet Take 10 mg by mouth at bedtime. 12/03/19  Yes [provider]  albuterol (VENTOLIN HFA) 108 (90 Base) MCG/ACT inhaler as needed.    [provider]  clonazePAM (KLONOPIN) 0.5 MG tablet Take 0.5 mg by mouth at bedtime as needed for anxiety.     [provider]  HYDROcodone bit-homatropine (HYCODAN) 5-1.5 MG/5ML syrup 5 mL Orally every 6 hrs as needed 05/04/22   [provider]  HYDROcodone bit-homatropine (HYCODAN) 5-1.5 MG/5ML syrup Take 5 mLs by mouth every 6 (six) hours as needed. 05/04/22  Current Outpatient Medications  Medication Sig Dispense Refill   amLODipine (NORVASC) 5 MG tablet Take 5 mg by mouth daily.     buPROPion (WELLBUTRIN XL) 300 MG 24 hr tablet TAKE 1 TABLET BY MOUTH EVERY DAY IN THE MORNING FOR 90 DAYS     estradiol (VIVELLE-DOT) 0.05 MG/24HR patch Place 1 patch onto the skin 2 (two) times a week.     JARDIANCE 10 MG TABS tablet Take 10 mg by mouth daily.     rosuvastatin (CRESTOR) 10 MG tablet Take 10 mg by mouth at bedtime.     albuterol (VENTOLIN HFA) 108 (90 Base) MCG/ACT inhaler as needed.     clonazePAM (KLONOPIN)  0.5 MG tablet Take 0.5 mg by mouth at bedtime as needed for anxiety.      HYDROcodone bit-homatropine (HYCODAN) 5-1.5 MG/5ML syrup 5 mL Orally every 6 hrs as needed     HYDROcodone bit-homatropine (HYCODAN) 5-1.5 MG/5ML syrup Take 5 mLs by mouth every 6 (six) hours as needed. 100 mL 0   Current Facility-Administered Medications  Medication Dose Route Frequency Provider Last Rate Last Admin   0.9 %  sodium chloride infusion  500 mL Intravenous Once Sharyn Creamer, MD        Allergies as of 05/18/2022 - Review Complete 05/18/2022  Allergen Reaction Noted   Amoxicillin Anaphylaxis 03/09/2012   Strawberry extract Shortness Of Breath, Itching, and Swelling 03/11/2012   Tetracycline Anaphylaxis 12/28/2019   Tetracyclines & related Nausea And Vomiting and Rash 03/09/2012   Ampicillin  05/18/2022   Fluconazole Photosensitivity 12/28/2019   Ibuprofen Other (See Comments) 06/23/2013   Irbesartan Swelling 05/04/2022   Other Nausea And Vomiting 03/09/2012   Tetracycline hcl Rash 05/18/2022    Family History  Problem Relation Age of Onset   Diabetes Mother    Dementia Father    Pulmonary embolism Brother    Dementia Paternal Grandmother    Colon cancer Neg Hx    Colon polyps Neg Hx    Crohn's disease Neg Hx    Esophageal cancer Neg Hx    Rectal cancer Neg Hx    Stomach cancer Neg Hx    Ulcerative colitis Neg Hx     Social History   Socioeconomic History   Marital status: Married    Spouse name: Not on file   Number of children: 1   Years of education: Not on file   Highest education level: Not on file  Occupational History   Occupation: Programmer, multimedia: [employer]  Tobacco Use   Smoking status: Never   Smokeless tobacco: Never  Vaping Use   Vaping Use: Never used  Substance and Sexual Activity   Alcohol use: No   Drug use: No   Sexual activity: Not on file  Other Topics Concern   Not on file  Social History Narrative   Lives   Caffeine use:    Right handed     Social Determinants of Health   Financial Resource Strain: Not on file  Food Insecurity: No Food Insecurity (03/30/2022)   Hunger Vital Sign    Worried About Running Out of Food in the Last Year: Never true    Ran Out of Food in the Last Year: Never true  Transportation Needs: No Transportation Needs (03/30/2022)   PRAPARE - Hydrologist (Medical): No    Lack of Transportation (Non-Medical): No  Physical Activity: Sufficiently Active (03/30/2022)   Exercise Vital Sign    Days  of Exercise per Week: 5 days    Minutes of Exercise per Session: 50 min  Stress: Not on file  Social Connections: Not on file  Intimate Partner Violence: Not on file    Physical Exam: Vital signs in last 24 hours: BP (!) 156/93   Pulse 84   Temp 97.8 F (36.6 C)   Ht 5\' 3"  (1.6 m)   Wt 164 lb (74.4 kg)   SpO2 97%   BMI 29.05 kg/m  GEN: NAD EYE: Sclerae anicteric ENT: MMM CV: Non-tachycardic Pulm: No increased work of breathing GI: Soft, NT/ND NEURO:  Alert & Oriented   Christia Reading, MD [facility]  05/18/2022 10:34 AM

## 2022-05-18 NOTE — Progress Notes (Signed)
Pt's states no medical or surgical changes since previsit or office visit. 

## 2022-05-18 NOTE — Patient Instructions (Addendum)
-   Discharge patient to home   - Repeat colonoscopy in 10 years for screening purposes - The findings and recommendations were discussed                            with the patient.  YOU HAD AN ENDOSCOPIC PROCEDURE TODAY AT [facility]:   Refer to the procedure report that was given to you for any specific questions about what was found during the examination.  If the procedure report does not answer your questions, please call your gastroenterologist to clarify.  If you requested that your care partner not be given the details of your procedure findings, then the procedure report has been included in a sealed envelope for you to review at your convenience later.  YOU SHOULD EXPECT: Some feelings of bloating in the abdomen. Passage of more gas than usual.  Walking can help get rid of the air that was put into your GI tract during the procedure and reduce the bloating. If you had a lower endoscopy (such as a colonoscopy or flexible sigmoidoscopy) you may notice spotting of blood in your stool or on the toilet paper. If you underwent a bowel prep for your procedure, you may not have a normal bowel movement for a few days.  Please Note:  You might notice some irritation and congestion in your nose or some drainage.  This is from the oxygen used during your procedure.  There is no need for concern and it should clear up in a day or so.  SYMPTOMS TO REPORT IMMEDIATELY:  Following lower endoscopy (colonoscopy or flexible sigmoidoscopy):  Excessive amounts of blood in the stool  Significant tenderness or worsening of abdominal pains  Swelling of the abdomen that is new, acute  Fever of 100F or higher  For urgent or emergent issues, a gastroenterologist can be reached at any hour by calling [phone]. Do not use MyChart messaging for urgent concerns.    DIET:  We do recommend a small meal at first, but then you may proceed to your regular diet.  Drink plenty of fluids but you  should avoid alcoholic beverages for 24 hours.  ACTIVITY:  You should plan to take it easy for the rest of today and you should NOT DRIVE or use heavy machinery until tomorrow (because of the sedation medicines used during the test).    FOLLOW UP: Our staff will call the number listed on your records the next business day following your procedure.  We will call around 7:15- 8:00 am to check on you and address any questions or concerns that you may have regarding the information given to you following your procedure. If we do not reach you, we will leave a message.     If any biopsies were taken you will be contacted by phone or by letter within the next 1-3 weeks.  Please call us at [phone] if you have not heard about the biopsies in 3 weeks.    SIGNATURES/CONFIDENTIALITY: You and/or your care partner have signed paperwork which will be entered into your electronic medical record.  These signatures attest to the fact that that the information above on your After Visit Summary has been reviewed and is understood.  Full responsibility of the confidentiality of this discharge information lies with you and/or your care-partner.

## 2022-05-18 NOTE — Progress Notes (Signed)
Report to pacu rn. Vss. Care resumed by rn. 

## 2022-05-19 ENCOUNTER — Telehealth: Payer: Self-pay

## 2022-05-19 NOTE — Telephone Encounter (Signed)
  Follow up Call-     05/18/2022    9:57 AM  Call back number  Post procedure Call Back phone  # [phone]  Permission to leave phone message Yes     Patient questions:  Do you have a fever, pain , or abdominal swelling? No. Pain Score  0 *  Have you tolerated food without any problems? Yes.    Have you been able to return to your normal activities? Yes.    Do you have any questions about your discharge instructions: Diet   No. Medications  No. Follow up visit  No.  Do you have questions or concerns about your Care? No.  Actions: * If pain score is 4 or above: No action needed, pain <4.

## 2022-06-26 ENCOUNTER — Other Ambulatory Visit (HOSPITAL_COMMUNITY): Payer: Self-pay

## 2022-06-26 DIAGNOSIS — R490 Dysphonia: Secondary | ICD-10-CM | POA: Diagnosis not present

## 2022-06-26 MED ORDER — OMEPRAZOLE 40 MG PO CPDR
40.0000 mg | DELAYED_RELEASE_CAPSULE | Freq: Two times a day (BID) | ORAL | 5 refills | Status: DC
  Filled 2022-06-26: qty 60, 30d supply, fill #0

## 2022-06-27 DIAGNOSIS — Z13820 Encounter for screening for osteoporosis: Secondary | ICD-10-CM | POA: Diagnosis not present

## 2022-07-27 DIAGNOSIS — R197 Diarrhea, unspecified: Secondary | ICD-10-CM | POA: Diagnosis not present

## 2022-08-28 ENCOUNTER — Other Ambulatory Visit (HOSPITAL_COMMUNITY): Payer: Self-pay

## 2022-08-28 DIAGNOSIS — R490 Dysphonia: Secondary | ICD-10-CM | POA: Diagnosis not present

## 2022-08-28 MED ORDER — FAMOTIDINE 40 MG PO TABS
40.0000 mg | ORAL_TABLET | Freq: Two times a day (BID) | ORAL | 3 refills | Status: AC
  Filled 2022-08-28: qty 180, 90d supply, fill #0

## 2022-08-29 ENCOUNTER — Other Ambulatory Visit (HOSPITAL_COMMUNITY): Payer: Self-pay

## 2022-08-31 ENCOUNTER — Other Ambulatory Visit (HOSPITAL_COMMUNITY): Payer: Self-pay

## 2022-08-31 DIAGNOSIS — E1169 Type 2 diabetes mellitus with other specified complication: Secondary | ICD-10-CM | POA: Diagnosis not present

## 2022-08-31 DIAGNOSIS — I10 Essential (primary) hypertension: Secondary | ICD-10-CM | POA: Diagnosis not present

## 2022-08-31 DIAGNOSIS — F329 Major depressive disorder, single episode, unspecified: Secondary | ICD-10-CM | POA: Diagnosis not present

## 2022-08-31 DIAGNOSIS — E7800 Pure hypercholesterolemia, unspecified: Secondary | ICD-10-CM | POA: Diagnosis not present

## 2022-08-31 MED ORDER — JARDIANCE 25 MG PO TABS
25.0000 mg | ORAL_TABLET | Freq: Every day | ORAL | 3 refills | Status: AC
  Filled 2022-08-31: qty 90, 90d supply, fill #0
  Filled 2022-12-13: qty 90, 90d supply, fill #1
  Filled 2023-03-12: qty 90, 90d supply, fill #2

## 2022-09-11 ENCOUNTER — Other Ambulatory Visit (HOSPITAL_COMMUNITY): Payer: Self-pay

## 2022-09-12 DIAGNOSIS — E119 Type 2 diabetes mellitus without complications: Secondary | ICD-10-CM | POA: Diagnosis not present

## 2022-10-01 ENCOUNTER — Telehealth: Payer: Federal, State, Local not specified - PPO | Admitting: Family

## 2022-10-01 DIAGNOSIS — H109 Unspecified conjunctivitis: Secondary | ICD-10-CM | POA: Diagnosis not present

## 2022-10-01 MED ORDER — POLYMYXIN B-TRIMETHOPRIM 10000-0.1 UNIT/ML-% OP SOLN: 1.0000 [drp] | Freq: Four times a day (QID) | OPHTHALMIC | 0 refills | Status: AC

## 2022-10-01 NOTE — Progress Notes (Signed)

## 2022-12-13 ENCOUNTER — Other Ambulatory Visit (HOSPITAL_COMMUNITY): Payer: Self-pay

## 2023-01-25 ENCOUNTER — Other Ambulatory Visit (HOSPITAL_COMMUNITY): Payer: Self-pay

## 2023-01-25 DIAGNOSIS — F329 Major depressive disorder, single episode, unspecified: Secondary | ICD-10-CM | POA: Diagnosis not present

## 2023-01-25 DIAGNOSIS — I10 Essential (primary) hypertension: Secondary | ICD-10-CM | POA: Diagnosis not present

## 2023-01-25 DIAGNOSIS — R1013 Epigastric pain: Secondary | ICD-10-CM | POA: Diagnosis not present

## 2023-01-25 DIAGNOSIS — F419 Anxiety disorder, unspecified: Secondary | ICD-10-CM | POA: Diagnosis not present

## 2023-01-25 MED ORDER — OMEPRAZOLE 20 MG PO CPDR
20.0000 mg | DELAYED_RELEASE_CAPSULE | Freq: Every day | ORAL | 0 refills | Status: AC
  Filled 2023-01-25: qty 30, 30d supply, fill #0

## 2023-01-25 MED ORDER — BUPROPION HCL ER (XL) 450 MG PO TB24
450.0000 mg | ORAL_TABLET | Freq: Every morning | ORAL | 3 refills | Status: AC
  Filled 2023-01-25: qty 90, 90d supply, fill #0
  Filled 2023-04-23: qty 90, 90d supply, fill #1

## 2023-01-26 ENCOUNTER — Other Ambulatory Visit (HOSPITAL_COMMUNITY): Payer: Self-pay

## 2023-03-02 ENCOUNTER — Other Ambulatory Visit (HOSPITAL_COMMUNITY): Payer: Self-pay

## 2023-03-02 MED ORDER — CLONAZEPAM 0.5 MG PO TABS
ORAL_TABLET | ORAL | 0 refills | Status: AC
Start: 2023-03-02 — End: ?
  Filled 2023-03-02 – 2023-03-05 (×2): qty 120, 40d supply, fill #0

## 2023-03-05 ENCOUNTER — Other Ambulatory Visit (HOSPITAL_COMMUNITY): Payer: Self-pay

## 2023-03-05 MED ORDER — AMLODIPINE BESYLATE 5 MG PO TABS
5.0000 mg | ORAL_TABLET | Freq: Every day | ORAL | 3 refills | Status: AC
  Filled 2023-03-05: qty 90, 90d supply, fill #0

## 2023-03-07 ENCOUNTER — Other Ambulatory Visit (HOSPITAL_COMMUNITY): Payer: Self-pay

## 2023-03-07 MED ORDER — ROSUVASTATIN CALCIUM 10 MG PO TABS
10.0000 mg | ORAL_TABLET | Freq: Every day | ORAL | 0 refills | Status: AC
  Filled 2023-03-07: qty 90, 90d supply, fill #0

## 2023-03-12 DIAGNOSIS — H40013 Open angle with borderline findings, low risk, bilateral: Secondary | ICD-10-CM | POA: Diagnosis not present

## 2023-03-15 DIAGNOSIS — Z0000 Encounter for general adult medical examination without abnormal findings: Secondary | ICD-10-CM | POA: Diagnosis not present

## 2023-03-15 DIAGNOSIS — E7800 Pure hypercholesterolemia, unspecified: Secondary | ICD-10-CM | POA: Diagnosis not present

## 2023-03-15 DIAGNOSIS — E1169 Type 2 diabetes mellitus with other specified complication: Secondary | ICD-10-CM | POA: Diagnosis not present

## 2023-03-15 DIAGNOSIS — I10 Essential (primary) hypertension: Secondary | ICD-10-CM | POA: Diagnosis not present

## 2023-03-20 ENCOUNTER — Other Ambulatory Visit (HOSPITAL_COMMUNITY): Payer: Self-pay

## 2023-03-20 MED ORDER — OZEMPIC (0.25 OR 0.5 MG/DOSE) 2 MG/3ML ~~LOC~~ SOPN
0.2500 mg | PEN_INJECTOR | SUBCUTANEOUS | 1 refills | Status: DC
Start: 2023-03-20 — End: 2023-05-08
  Filled 2023-03-20: qty 3, 28d supply, fill #0
  Filled 2023-03-20: qty 3, 56d supply, fill #0
  Filled 2023-04-23: qty 3, 28d supply, fill #1

## 2023-04-02 ENCOUNTER — Other Ambulatory Visit (HOSPITAL_COMMUNITY): Payer: Self-pay

## 2023-04-02 MED ORDER — ACCU-CHEK SOFTCLIX LANCETS MISC
3 refills | Status: AC
  Filled 2023-04-02: qty 100, 90d supply, fill #0

## 2023-04-02 MED ORDER — ACCU-CHEK GUIDE TEST VI STRP
ORAL_STRIP | Freq: Every day | 3 refills | Status: AC
  Filled 2023-04-02: qty 50, 50d supply, fill #0

## 2023-04-02 MED ORDER — ACCU-CHEK GUIDE W/DEVICE KIT
PACK | Freq: Every day | 0 refills | Status: AC
  Filled 2023-04-02 (×2): qty 1, 30d supply, fill #0

## 2023-04-03 ENCOUNTER — Other Ambulatory Visit (HOSPITAL_COMMUNITY): Payer: Self-pay

## 2023-04-03 MED ORDER — FREESTYLE LIBRE 2 SENSOR MISC
1.0000 | 11 refills | Status: AC
  Filled 2023-04-03 (×2): qty 2, 28d supply, fill #0

## 2023-04-03 MED ORDER — FREESTYLE LIBRE 2 READER DEVI
1 refills | Status: AC
  Filled 2023-04-03: qty 1, 30d supply, fill #0

## 2023-04-05 ENCOUNTER — Other Ambulatory Visit (HOSPITAL_COMMUNITY): Payer: Self-pay

## 2023-04-05 ENCOUNTER — Other Ambulatory Visit: Payer: Self-pay

## 2023-04-05 MED ORDER — FREESTYLE LIBRE 3 SENSOR MISC
3 refills | Status: AC
  Filled 2023-04-05 – 2023-11-29 (×3): qty 6, 84d supply, fill #0

## 2023-04-05 MED ORDER — FREESTYLE LIBRE 3 READER DEVI
1 refills | Status: AC
  Filled 2023-04-05: qty 1, 1d supply, fill #0
  Filled 2023-10-26: qty 1, 30d supply, fill #0
  Filled 2023-11-29: qty 1, 90d supply, fill #0

## 2023-04-06 DIAGNOSIS — R319 Hematuria, unspecified: Secondary | ICD-10-CM | POA: Diagnosis not present

## 2023-04-06 DIAGNOSIS — E1169 Type 2 diabetes mellitus with other specified complication: Secondary | ICD-10-CM | POA: Diagnosis not present

## 2023-04-09 DIAGNOSIS — N7689 Other specified inflammation of vagina and vulva: Secondary | ICD-10-CM | POA: Diagnosis not present

## 2023-04-09 DIAGNOSIS — Z1272 Encounter for screening for malignant neoplasm of vagina: Secondary | ICD-10-CM | POA: Diagnosis not present

## 2023-04-09 DIAGNOSIS — Z01419 Encounter for gynecological examination (general) (routine) without abnormal findings: Secondary | ICD-10-CM | POA: Diagnosis not present

## 2023-04-09 DIAGNOSIS — Z1231 Encounter for screening mammogram for malignant neoplasm of breast: Secondary | ICD-10-CM | POA: Diagnosis not present

## 2023-04-09 DIAGNOSIS — R319 Hematuria, unspecified: Secondary | ICD-10-CM | POA: Diagnosis not present

## 2023-04-10 ENCOUNTER — Other Ambulatory Visit (HOSPITAL_COMMUNITY): Payer: Self-pay

## 2023-04-10 DIAGNOSIS — R319 Hematuria, unspecified: Secondary | ICD-10-CM | POA: Diagnosis not present

## 2023-04-10 MED ORDER — TERCONAZOLE 0.8 % VA CREA
1.0000 | TOPICAL_CREAM | Freq: Every day | VAGINAL | 0 refills | Status: AC
  Filled 2023-04-10: qty 20, 3d supply, fill #0

## 2023-04-13 ENCOUNTER — Other Ambulatory Visit (HOSPITAL_COMMUNITY): Payer: Self-pay

## 2023-04-13 MED ORDER — NITROFURANTOIN MONOHYD MACRO 100 MG PO CAPS
100.0000 mg | ORAL_CAPSULE | Freq: Two times a day (BID) | ORAL | 0 refills | Status: AC
  Filled 2023-04-13: qty 14, 7d supply, fill #0

## 2023-04-14 ENCOUNTER — Encounter: Payer: Self-pay | Admitting: Emergency Medicine

## 2023-04-14 ENCOUNTER — Emergency Department: Payer: BC Managed Care – PPO

## 2023-04-14 ENCOUNTER — Other Ambulatory Visit: Payer: Self-pay

## 2023-04-14 ENCOUNTER — Emergency Department
Admission: EM | Admit: 2023-04-14 | Discharge: 2023-04-15 | Disposition: A | Discharge status: Home / Self Care | Payer: BC Managed Care – PPO | Attending: Emergency Medicine | Admitting: Emergency Medicine

## 2023-04-14 DIAGNOSIS — S060X0A Concussion without loss of consciousness, initial encounter: Secondary | ICD-10-CM | POA: Diagnosis not present

## 2023-04-14 DIAGNOSIS — G44309 Post-traumatic headache, unspecified, not intractable: Secondary | ICD-10-CM | POA: Diagnosis not present

## 2023-04-14 DIAGNOSIS — Z7984 Long term (current) use of oral hypoglycemic drugs: Secondary | ICD-10-CM | POA: Insufficient documentation

## 2023-04-14 DIAGNOSIS — E119 Type 2 diabetes mellitus without complications: Secondary | ICD-10-CM | POA: Diagnosis not present

## 2023-04-14 DIAGNOSIS — Z79899 Other long term (current) drug therapy: Secondary | ICD-10-CM | POA: Diagnosis not present

## 2023-04-14 DIAGNOSIS — R519 Headache, unspecified: Secondary | ICD-10-CM | POA: Diagnosis not present

## 2023-04-14 DIAGNOSIS — I10 Essential (primary) hypertension: Secondary | ICD-10-CM | POA: Insufficient documentation

## 2023-04-14 NOTE — ED Triage Notes (Signed)
 Pt reports left sided headache that started earlier today. Reports concern for recurrent headache since a mechanical fall with head injury +LOC on Dec 22nd. Did not seek medical attention at that time. Headache associated with light sensitivity, blurry/double vision, ringing left ear, and noise sensitivity. Denies NV. No blood thinner

## 2023-04-14 NOTE — ED Notes (Signed)
 Spoke with EDP Issacs regarding pts complaint and presentation. Verbal order for CT imaging obtains. No labs at this time.

## 2023-04-15 MED ORDER — BUTALBITAL-APAP-CAFFEINE 50-325-40 MG PO TABS: 1.0000 | ORAL_TABLET | Freq: Four times a day (QID) | ORAL | 0 refills | Status: AC | PRN

## 2023-04-15 MED ORDER — DIPHENHYDRAMINE HCL 50 MG/ML IJ SOLN
25.0000 mg | Freq: Once | INTRAMUSCULAR | Status: AC
  Administered 2023-04-15: 25 mg via INTRAVENOUS
  Filled 2023-04-15: qty 1

## 2023-04-15 MED ORDER — PROCHLORPERAZINE EDISYLATE 10 MG/2ML IJ SOLN
10.0000 mg | Freq: Once | INTRAMUSCULAR | Status: AC
  Administered 2023-04-15: 10 mg via INTRAVENOUS
  Filled 2023-04-15: qty 2

## 2023-04-15 MED ORDER — ONDANSETRON 4 MG PO TBDP: 4.0000 mg | ORAL_TABLET | Freq: Four times a day (QID) | ORAL | 0 refills | Status: AC | PRN

## 2023-04-15 NOTE — Discharge Instructions (Addendum)
You have had a head injury resulting in a concussion.  A concussion is a clinical diagnosis and not seen on imaging (CT, MRI).  Please avoid alcohol, sedatives for the next week.  Please rest and drink plenty of water.  We recommend that you avoid any activity that may lead to another head injury for at least 1 week or until your symptoms have completely resolved.  We also recommend "brain rest" to ensure the best possible long term outcomes - please avoid TV, cell phones, tablets, computers as much as possible for the next 48 hours.

## 2023-04-15 NOTE — ED Provider Notes (Signed)
 [facility] Provider Note    Event Date/Time   First MD Initiated Contact with Patient 04/14/23 2357     (approximate)   History   Headache   HPI  [name] is a 63 y.o. female with history of hypertension, hyperlipidemia who presents to the emergency department with headache.  Patient states it is mostly left-sided, throbbing.  She has had recurrent headaches since she had a head injury with loss of consciousness on December 22.  She has had photophobia, blurry vision, phonophobia.  Denies numbness, tingling or weakness except some tingling to the left side of her head where she hit her head.  She has had nausea without vomiting.  Not on blood thinners.  No neck or back pain.  Has taken Tylenol  at 8 PM without relief.  No history of chronic headaches, migraines.   History provided by patient, husband.    Past Medical History:  Diagnosis Date   Anemia    Anxiety    Borderline diabetic    Bowel obstruction (HCC)    Complication of anesthesia    aspirated after breast reduction surgery and had n/v with that surgery   Depression    Diabetes mellitus without complication (HCC)    Ectopic pregnancy    Enteritis 11/04/2013   Hypercholesteremia    Hypertension    Intestinal adhesions with partial obstruction (HCC)    Mild cognitive impairment 07/25/2017   Partial small bowel obstruction (HCC) 11/04/2013   S/P small bowel resection 08/21/2017   Sleep disorder    Small bowel obstruction (HCC) 06/22/2017    Past Surgical History:  Procedure Laterality Date   ABDOMINAL HYSTERECTOMY  2003   APPENDECTOMY  08/21/2017   Procedure: APPENDECTOMY;  Surgeon: Jordis Laneta FALCON, MD;  Location: [facility] ORS;  Service: General;;   BOWEL RESECTION  08/21/2017   Procedure: SMALL BOWEL RESECTION WITH PRIMARY ANASTOMOSIS;  Surgeon: Jordis Laneta FALCON, MD;  Location: [facility];  Service: General;;   BREAST REDUCTION SURGERY Bilateral 1993   BREAST SURGERY     CESAREAN SECTION   2003   COLONOSCOPY     ECTOPIC PREGNANCY SURGERY     tubal removel   ESOPHAGOGASTRODUODENOSCOPY     EXPLORATORY LAPAROTOMY     LAPAROSCOPIC LYSIS OF ADHESIONS N/A 08/21/2017   Procedure: LAPAROSCOPIC LYSIS OF ADHESIONS CONVERTED TO OPEN;  Surgeon: Jordis Laneta FALCON, MD;  Location: [facility] OR[facility];  Service: General;  Laterality: N/A;   TONSILLECTOMY     UTERINE FIBROID SURGERY      MEDICATIONS:  Prior to Admission medications   Medication Sig Start Date End Date Taking? Authorizing Provider  Accu-Chek Softclix Lancets lancets Use as directed to check blood sugar levels daily. 04/02/23     albuterol (VENTOLIN HFA) 108 (90 Base) MCG/ACT inhaler as needed.    [provider]  amLODipine  (NORVASC ) 5 MG tablet Take 5 mg by mouth daily.    [provider]  amLODipine  (NORVASC ) 5 MG tablet Take 1 tablet (5 mg total) by mouth daily. 03/05/23     Blood Glucose Monitoring Suppl (ACCU-CHEK GUIDE) w/Device KIT Use as directed to check blood sugar once a day 04/02/23     buPROPion  (WELLBUTRIN  XL) 300 MG 24 hr tablet TAKE 1 TABLET BY MOUTH EVERY DAY IN THE MORNING FOR 90 DAYS    [provider]  buPROPion  HCl ER, XL, 450 MG TB24 Take 1 tablet (450 mg total) by mouth in the morning. 01/25/23     clonazePAM  (  KLONOPIN ) 0.5 MG tablet Take 0.5 mg by mouth at bedtime as needed for anxiety.     [provider]  clonazePAM  (KLONOPIN ) 0.5 MG tablet Take 1 tablet (0.5 mg total) by mouth in the morning AND 2 tablets (1 mg total) every evening as directed 03/02/23     Continuous Glucose Receiver (FREESTYLE LIBRE 2 READER) DEVI Use to monitor blood sugar as directed 04/03/23     Continuous Glucose Receiver (FREESTYLE LIBRE 3 READER) DEVI Use to monitor blood sugar daily as directed 04/05/23     Continuous Glucose Sensor (FREESTYLE LIBRE 2 SENSOR) MISC Place 1 each onto the skin (back of upper arm) every 14 (fourteen) days. 04/03/23     Continuous Glucose Sensor (FREESTYLE LIBRE 3 SENSOR)  MISC Apply to upper arm every 14 (fourteen) days. 04/05/23     empagliflozin  (JARDIANCE ) 25 MG TABS tablet Take 1 tablet (25 mg total) by mouth daily. 08/31/22     estradiol  (VIVELLE -DOT) 0.05 MG/24HR patch Place 1 patch onto the skin 2 (two) times a week. 11/17/19   [provider]  famotidine  (PEPCID ) 40 MG tablet Take 1 tablet (40 mg total) by mouth 2 (two) times daily. 08/28/22     glucose blood (ACCU-CHEK GUIDE TEST) test strip Use to check blood sugar once daily. 04/02/23     HYDROcodone  bit-homatropine (HYCODAN) 5-1.5 MG/5ML syrup 5 mL Orally every 6 hrs as needed 05/04/22   [provider]  HYDROcodone  bit-homatropine (HYCODAN) 5-1.5 MG/5ML syrup Take 5 mLs by mouth every 6 (six) hours as needed. 05/04/22     JARDIANCE  10 MG TABS tablet Take 10 mg by mouth daily.    [provider]  nitrofurantoin , macrocrystal-monohydrate, (MACROBID ) 100 MG capsule Take 1 capsule (100 mg total) by mouth every 12 (twelve) hours for 7 days. 04/13/23 04/20/23    omeprazole  (PRILOSEC) 20 MG capsule Take 1 capsule (20 mg total) by mouth daily, 1/2 to 1 hour before morning meal 01/25/23     rosuvastatin  (CRESTOR ) 10 MG tablet Take 10 mg by mouth at bedtime. 12/03/19   [provider]  rosuvastatin  (CRESTOR ) 10 MG tablet Take 1 tablet (10 mg total) by mouth daily. 03/07/23     Semaglutide ,0.25 or 0.5MG /DOS, (OZEMPIC , 0.25 OR 0.5 MG/DOSE,) 2 MG/3ML SOPN Inject 0.25 mg into the skin once a week. 03/20/23     trimethoprim -polymyxin b  (POLYTRIM ) ophthalmic solution Place 1 drop into the left eye every 6 (six) hours. 10/01/22   Lavell Bari LABOR, FNP    Physical Exam   Triage Vital Signs: ED Triage Vitals  Encounter Vitals Group     BP 04/14/23 2231 (!) 171/92     Systolic BP Percentile --      Diastolic BP Percentile --      Pulse Rate 04/14/23 2231 92     Resp 04/14/23 2231 16     Temp 04/14/23 2231 98.3 F (36.8 C)     Temp Source 04/14/23 2231 Oral     SpO2 04/14/23 2231 100 %      Weight 04/14/23 2232 164 lb (74.4 kg)     Height 04/14/23 2232 5' 3 (1.6 m)     Head Circumference --      Peak Flow --      Pain Score 04/14/23 2232 10     Pain Loc --      Pain Education --      Exclude from Growth Chart --     Most recent vital signs: Vitals:  04/14/23 2231  BP: (!) 171/92  Pulse: 92  Resp: 16  Temp: 98.3 F (36.8 C)  SpO2: 100%    CONSTITUTIONAL: Alert, responds appropriately to questions. Well-appearing; well-nourished HEAD: Normocephalic, atraumatic EYES: Conjunctivae clear, pupils appear equal, sclera nonicteric ENT: normal nose; moist mucous membranes NECK: Supple, normal ROM, no midline cervical spine tenderness or step-off or deformity CARD: RRR; S1 and S2 appreciated RESP: Normal chest excursion without splinting or tachypnea; breath sounds clear and equal bilaterally; no wheezes, no rhonchi, no rales, no hypoxia or respiratory distress, speaking full sentences ABD/GI: Non-distended; soft, non-tender, no rebound, no guarding, no peritoneal signs BACK: The back appears normal EXT: Normal ROM in all joints; no deformity noted, no edema SKIN: Normal color for age and race; warm; no rash on exposed skin NEURO: Moves all extremities equally, normal speech, normal sensation diffusely, cranial nerves II through XII intact, normal gait, + photophobia PSYCH: The patient's mood and manner are appropriate.   ED Results / Procedures / Treatments   LABS: (all labs ordered are listed, but only abnormal results are displayed) Labs Reviewed - No data to display   EKG:  EKG Interpretation Date/Time:    Ventricular Rate:    PR Interval:    QRS Duration:    QT Interval:    QTC Calculation:   R Axis:      Text Interpretation:           RADIOLOGY: My personal review and interpretation of imaging: CT head and cervical spine show no traumatic injury.  I have personally reviewed all radiology reports.   CT Cervical Spine Wo Contrast Result  Date: 04/14/2023 CLINICAL DATA:  Headache, fall. EXAM: CT CERVICAL SPINE WITHOUT CONTRAST TECHNIQUE: Multidetector CT imaging of the cervical spine was performed without intravenous contrast. Multiplanar CT image reconstructions were also generated. RADIATION DOSE REDUCTION: This exam was performed according to the departmental dose-optimization program which includes automated exposure control, adjustment of the mA and/or kV according to patient size and/or use of iterative reconstruction technique. COMPARISON:  None Available. FINDINGS: Alignment: Normal. Skull base and vertebrae: No acute fracture. No primary bone lesion or focal pathologic process. Soft tissues and spinal canal: No prevertebral fluid or swelling. No visible canal hematoma. Disc levels: No significant central canal or neural foraminal stenosis at any level. Upper chest: Negative. Other: None. IMPRESSION: No acute fracture or traumatic subluxation of the cervical spine. Electronically Signed   By: Greig Pique M.D.   On: 04/14/2023 23:12   CT Head Wo Contrast Result Date: 04/14/2023 CLINICAL DATA:  Headache.  Fall. EXAM: CT HEAD WITHOUT CONTRAST TECHNIQUE: Contiguous axial images were obtained from the base of the skull through the vertex without intravenous contrast. RADIATION DOSE REDUCTION: This exam was performed according to the departmental dose-optimization program which includes automated exposure control, adjustment of the mA and/or kV according to patient size and/or use of iterative reconstruction technique. COMPARISON:  MRI brain 07/31/2017. FINDINGS: Brain: No evidence of acute infarction, hemorrhage, hydrocephalus, extra-axial collection or mass lesion/mass effect. Vascular: No hyperdense vessel or unexpected calcification. Skull: Normal. Negative for fracture or focal lesion. Sinuses/Orbits: No acute finding. Other: None. IMPRESSION: No acute intracranial abnormality. Electronically Signed   By: Greig Pique M.D.   On: 04/14/2023  23:09     PROCEDURES:  Critical Care performed: No      Procedures    IMPRESSION / MDM / ASSESSMENT AND PLAN / ED COURSE  I reviewed the triage vital signs and the nursing notes.  Patient here with what I suspect is a postconcussive headache.     DIFFERENTIAL DIAGNOSIS (includes but not limited to):   Postconcussive headache, migraine, intracranial hemorrhage, skull fracture, doubt stroke, meningitis, CVT   Patient's presentation is most consistent with acute presentation with potential threat to life or bodily function.   PLAN: CT head and cervical spine were obtained from triage and reviewed/interpreted by myself and the radiologist and show no acute traumatic injury.  Will give migraine cocktail and reassess.  Patient neurologically intact here, afebrile.  Patient declines IV fluids.   MEDICATIONS GIVEN IN ED: Medications  prochlorperazine  (COMPAZINE ) injection 10 mg (10 mg Intravenous Given 04/15/23 0031)  diphenhydrAMINE  (BENADRYL ) injection 25 mg (25 mg Intravenous Given 04/15/23 0032)     ED COURSE: Patient reports headache almost completely gone after Compazine  and Benadryl .  Avoiding NSAIDs given patient has a documented history previously of AKI.  Will discharge with prescription for Fioricet, Zofran .  Discussed head injury return precautions.  She has a PCP for close follow-up.   At this time, I do not feel there is any life-threatening condition present. I reviewed all nursing notes, vitals, pertinent previous records.  All lab and urine results, EKGs, imaging ordered have been independently reviewed and interpreted by myself.  I reviewed all available radiology reports from any imaging ordered this visit.  Based on my assessment, I feel the patient is safe to be discharged home without further emergent workup and can continue workup as an outpatient as needed. Discussed all findings, treatment plan as well as usual and customary return precautions.  They  verbalize understanding and are comfortable with this plan.  Outpatient follow-up has been provided as needed.  All questions have been answered.    CONSULTS:  none   OUTSIDE RECORDS REVIEWED: Reviewed last family medicine note on 10/01/2022.       FINAL CLINICAL IMPRESSION(S) / ED DIAGNOSES   Final diagnoses:  Post-concussion headache     Rx / DC Orders   ED Discharge Orders          Ordered    butalbital -acetaminophen -caffeine  (FIORICET) 50-325-40 MG tablet  Every 6 hours PRN        04/15/23 0109    ondansetron  (ZOFRAN -ODT) 4 MG disintegrating tablet  Every 6 hours PRN        04/15/23 0109             Note:  This document was prepared using Dragon voice recognition software and may include unintentional dictation errors.   Jahzeel Poythress, Josette SAILOR, DO 04/15/23 0200

## 2023-04-23 ENCOUNTER — Encounter (HOSPITAL_COMMUNITY): Payer: Self-pay

## 2023-04-24 ENCOUNTER — Other Ambulatory Visit (HOSPITAL_COMMUNITY): Payer: Self-pay

## 2023-04-24 ENCOUNTER — Encounter (HOSPITAL_COMMUNITY): Payer: Self-pay

## 2023-04-24 MED ORDER — AMLODIPINE BESYLATE 10 MG PO TABS
10.0000 mg | ORAL_TABLET | Freq: Every day | ORAL | 3 refills | Status: AC
  Filled 2023-04-24: qty 90, 90d supply, fill #0
  Filled 2023-07-24: qty 90, 90d supply, fill #1

## 2023-04-25 ENCOUNTER — Other Ambulatory Visit (HOSPITAL_COMMUNITY): Payer: Self-pay

## 2023-05-04 DIAGNOSIS — R319 Hematuria, unspecified: Secondary | ICD-10-CM | POA: Diagnosis not present

## 2023-05-08 ENCOUNTER — Other Ambulatory Visit (HOSPITAL_COMMUNITY): Payer: Self-pay

## 2023-05-08 MED ORDER — OZEMPIC (0.25 OR 0.5 MG/DOSE) 2 MG/3ML ~~LOC~~ SOPN
0.5000 mg | PEN_INJECTOR | SUBCUTANEOUS | 0 refills | Status: AC
  Filled 2023-05-08 – 2023-06-10 (×2): qty 3, 28d supply, fill #0
  Filled 2023-07-09: qty 3, 28d supply, fill #1

## 2023-05-14 DIAGNOSIS — R319 Hematuria, unspecified: Secondary | ICD-10-CM | POA: Diagnosis not present

## 2023-05-14 DIAGNOSIS — N1330 Unspecified hydronephrosis: Secondary | ICD-10-CM | POA: Diagnosis not present

## 2023-05-14 DIAGNOSIS — R9389 Abnormal findings on diagnostic imaging of other specified body structures: Secondary | ICD-10-CM | POA: Diagnosis not present

## 2023-05-15 ENCOUNTER — Other Ambulatory Visit: Payer: Self-pay | Admitting: Internal Medicine

## 2023-05-15 DIAGNOSIS — R319 Hematuria, unspecified: Secondary | ICD-10-CM

## 2023-05-21 ENCOUNTER — Ambulatory Visit
Admission: RE | Admit: 2023-05-21 | Discharge: 2023-05-21 | Disposition: A | Discharge status: Home / Self Care | Payer: Federal, State, Local not specified - PPO | Source: Ambulatory Visit | Attending: Internal Medicine | Admitting: Internal Medicine

## 2023-05-21 DIAGNOSIS — R319 Hematuria, unspecified: Secondary | ICD-10-CM

## 2023-05-25 ENCOUNTER — Other Ambulatory Visit: Payer: Self-pay

## 2023-06-11 ENCOUNTER — Other Ambulatory Visit (HOSPITAL_COMMUNITY): Payer: Self-pay

## 2023-07-10 ENCOUNTER — Other Ambulatory Visit (HOSPITAL_COMMUNITY): Payer: Self-pay

## 2023-07-10 MED ORDER — CLONAZEPAM 0.5 MG PO TABS
0.5000 mg | ORAL_TABLET | Freq: Every morning | ORAL | 2 refills | Status: AC
  Filled 2023-07-10: qty 90, 30d supply, fill #0
  Filled 2023-08-14: qty 90, 30d supply, fill #1

## 2023-07-12 ENCOUNTER — Other Ambulatory Visit (HOSPITAL_COMMUNITY): Payer: Self-pay

## 2023-07-24 ENCOUNTER — Other Ambulatory Visit (HOSPITAL_COMMUNITY): Payer: Self-pay

## 2023-07-25 ENCOUNTER — Other Ambulatory Visit (HOSPITAL_COMMUNITY): Payer: Self-pay

## 2023-07-25 MED ORDER — ESTRADIOL 0.075 MG/24HR TD PTTW
1.0000 | MEDICATED_PATCH | TRANSDERMAL | 3 refills | Status: AC
  Filled 2023-07-25: qty 24, 84d supply, fill #0
  Filled 2023-10-15: qty 24, 84d supply, fill #1
  Filled 2024-02-16: qty 24, 84d supply, fill #2

## 2023-07-26 DIAGNOSIS — I10 Essential (primary) hypertension: Secondary | ICD-10-CM | POA: Diagnosis not present

## 2023-07-26 DIAGNOSIS — E7800 Pure hypercholesterolemia, unspecified: Secondary | ICD-10-CM | POA: Diagnosis not present

## 2023-07-26 DIAGNOSIS — E1169 Type 2 diabetes mellitus with other specified complication: Secondary | ICD-10-CM | POA: Diagnosis not present

## 2023-08-01 ENCOUNTER — Other Ambulatory Visit (HOSPITAL_COMMUNITY): Payer: Self-pay

## 2023-08-01 MED ORDER — OZEMPIC (1 MG/DOSE) 4 MG/3ML ~~LOC~~ SOPN
1.0000 mg | PEN_INJECTOR | SUBCUTANEOUS | 1 refills | Status: DC
  Filled 2023-08-01: qty 3, 28d supply, fill #0
  Filled 2023-09-03: qty 3, 28d supply, fill #1
  Filled 2023-10-01: qty 3, 28d supply, fill #2

## 2023-08-15 ENCOUNTER — Other Ambulatory Visit: Payer: Self-pay

## 2023-08-15 ENCOUNTER — Other Ambulatory Visit (HOSPITAL_COMMUNITY): Payer: Self-pay

## 2023-09-04 ENCOUNTER — Other Ambulatory Visit (HOSPITAL_COMMUNITY): Payer: Self-pay

## 2023-09-04 MED ORDER — ROSUVASTATIN CALCIUM 10 MG PO TABS
10.0000 mg | ORAL_TABLET | Freq: Every day | ORAL | 0 refills | Status: AC
  Filled 2023-09-04: qty 90, 90d supply, fill #0

## 2023-10-18 ENCOUNTER — Other Ambulatory Visit (HOSPITAL_COMMUNITY): Payer: Self-pay

## 2023-10-18 DIAGNOSIS — E7800 Pure hypercholesterolemia, unspecified: Secondary | ICD-10-CM | POA: Diagnosis not present

## 2023-10-18 DIAGNOSIS — E1169 Type 2 diabetes mellitus with other specified complication: Secondary | ICD-10-CM | POA: Diagnosis not present

## 2023-10-18 DIAGNOSIS — I10 Essential (primary) hypertension: Secondary | ICD-10-CM | POA: Diagnosis not present

## 2023-10-18 DIAGNOSIS — K5900 Constipation, unspecified: Secondary | ICD-10-CM | POA: Diagnosis not present

## 2023-10-18 MED ORDER — OZEMPIC (1 MG/DOSE) 4 MG/3ML ~~LOC~~ SOPN
1.0000 mg | PEN_INJECTOR | SUBCUTANEOUS | 5 refills | Status: DC
  Filled 2023-10-18: qty 3, 28d supply, fill #0

## 2023-10-19 ENCOUNTER — Other Ambulatory Visit (HOSPITAL_COMMUNITY): Payer: Self-pay

## 2023-10-19 MED ORDER — OZEMPIC (0.25 OR 0.5 MG/DOSE) 2 MG/3ML ~~LOC~~ SOPN
0.5000 mg | PEN_INJECTOR | SUBCUTANEOUS | 5 refills | Status: AC
  Filled 2023-10-19: qty 3, 28d supply, fill #0
  Filled 2023-11-29: qty 3, 28d supply, fill #1

## 2023-10-26 ENCOUNTER — Other Ambulatory Visit (HOSPITAL_COMMUNITY): Payer: Self-pay

## 2023-10-26 DIAGNOSIS — J0100 Acute maxillary sinusitis, unspecified: Secondary | ICD-10-CM | POA: Diagnosis not present

## 2023-10-26 MED ORDER — AZITHROMYCIN 250 MG PO TABS
ORAL_TABLET | ORAL | 0 refills | Status: AC
  Filled 2023-10-26: qty 6, 5d supply, fill #0

## 2023-11-14 DIAGNOSIS — E119 Type 2 diabetes mellitus without complications: Secondary | ICD-10-CM | POA: Diagnosis not present

## 2023-11-26 ENCOUNTER — Other Ambulatory Visit (HOSPITAL_COMMUNITY): Payer: Self-pay

## 2023-11-26 ENCOUNTER — Encounter (HOSPITAL_COMMUNITY): Payer: Self-pay

## 2023-11-30 ENCOUNTER — Other Ambulatory Visit (HOSPITAL_COMMUNITY): Payer: Self-pay

## 2023-11-30 MED ORDER — OZEMPIC (0.25 OR 0.5 MG/DOSE) 2 MG/3ML ~~LOC~~ SOPN
0.5000 mg | PEN_INJECTOR | SUBCUTANEOUS | 3 refills | Status: AC
  Filled 2023-11-30: qty 9, 63d supply, fill #0
  Filled 2023-12-23: qty 6, 56d supply, fill #0
  Filled 2024-03-19 – 2024-03-20 (×2): qty 6, 56d supply, fill #1

## 2023-12-18 IMAGING — MG MM DIGITAL DIAGNOSTIC UNILAT*R* W/ TOMO W/ CAD
4 series · 4 of 12 positions shown · non-contrast
Comparison: Previous exam(s).

CLINICAL DATA: Patient describes intermittent sharp/shooting pain
within the inner RIGHT breast, mostly with arm movement. Patient
denies focal pain or lump. History of bilateral breast reduction
surgery.

EXAM:
DIGITAL DIAGNOSTIC UNILATERAL RIGHT MAMMOGRAM WITH TOMOSYNTHESIS AND
CAD
TECHNIQUE: Right digital diagnostic mammography and breast tomosynthesis was
performed. The images were evaluated with computer-aided detection.

[R CC synth-2D]
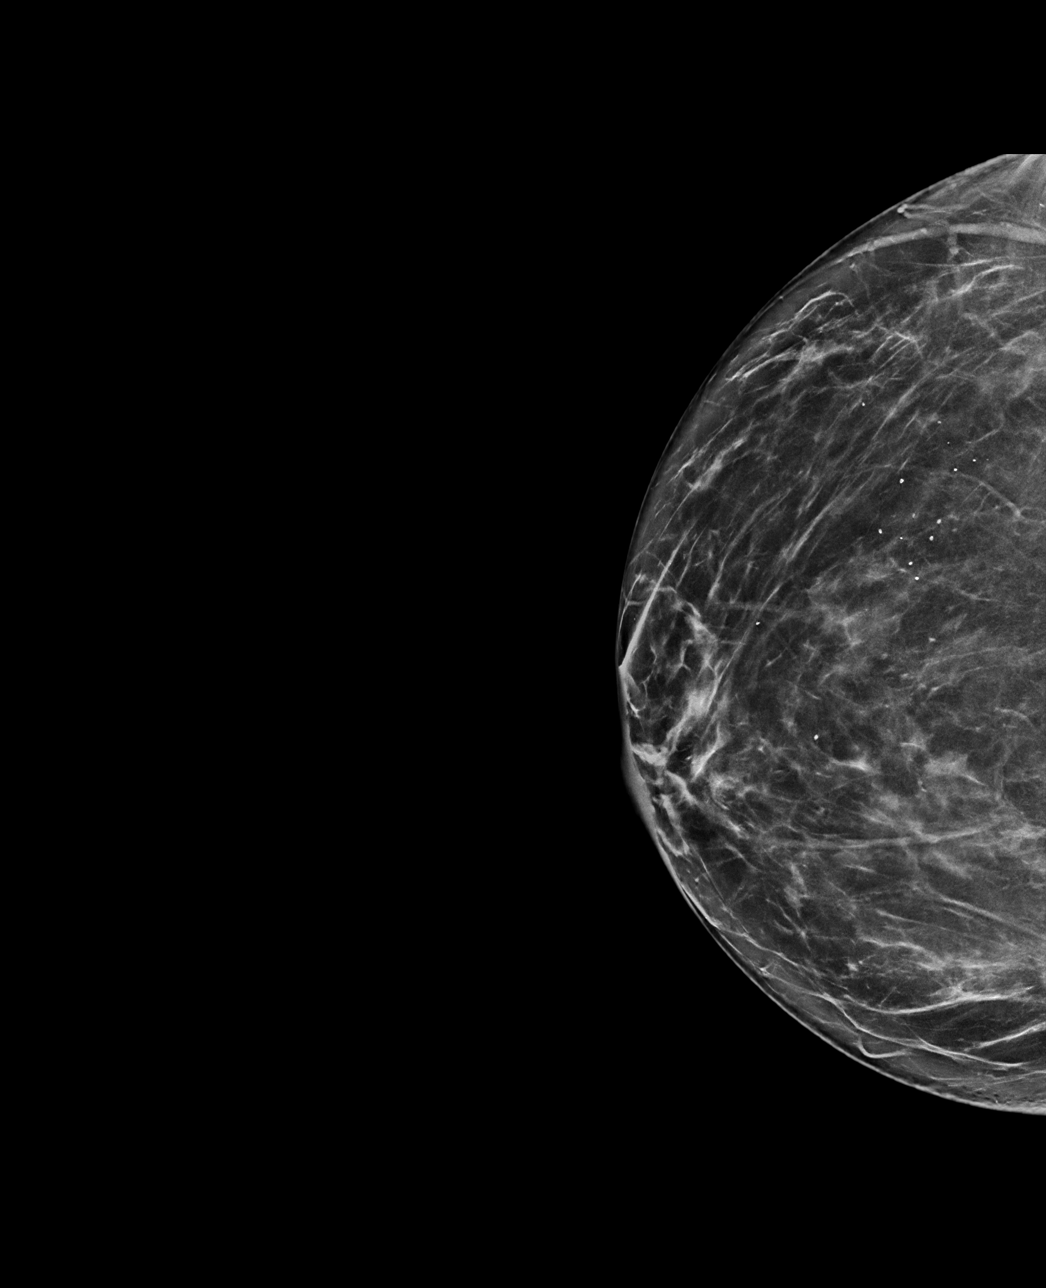

[R MLO synth-2D]
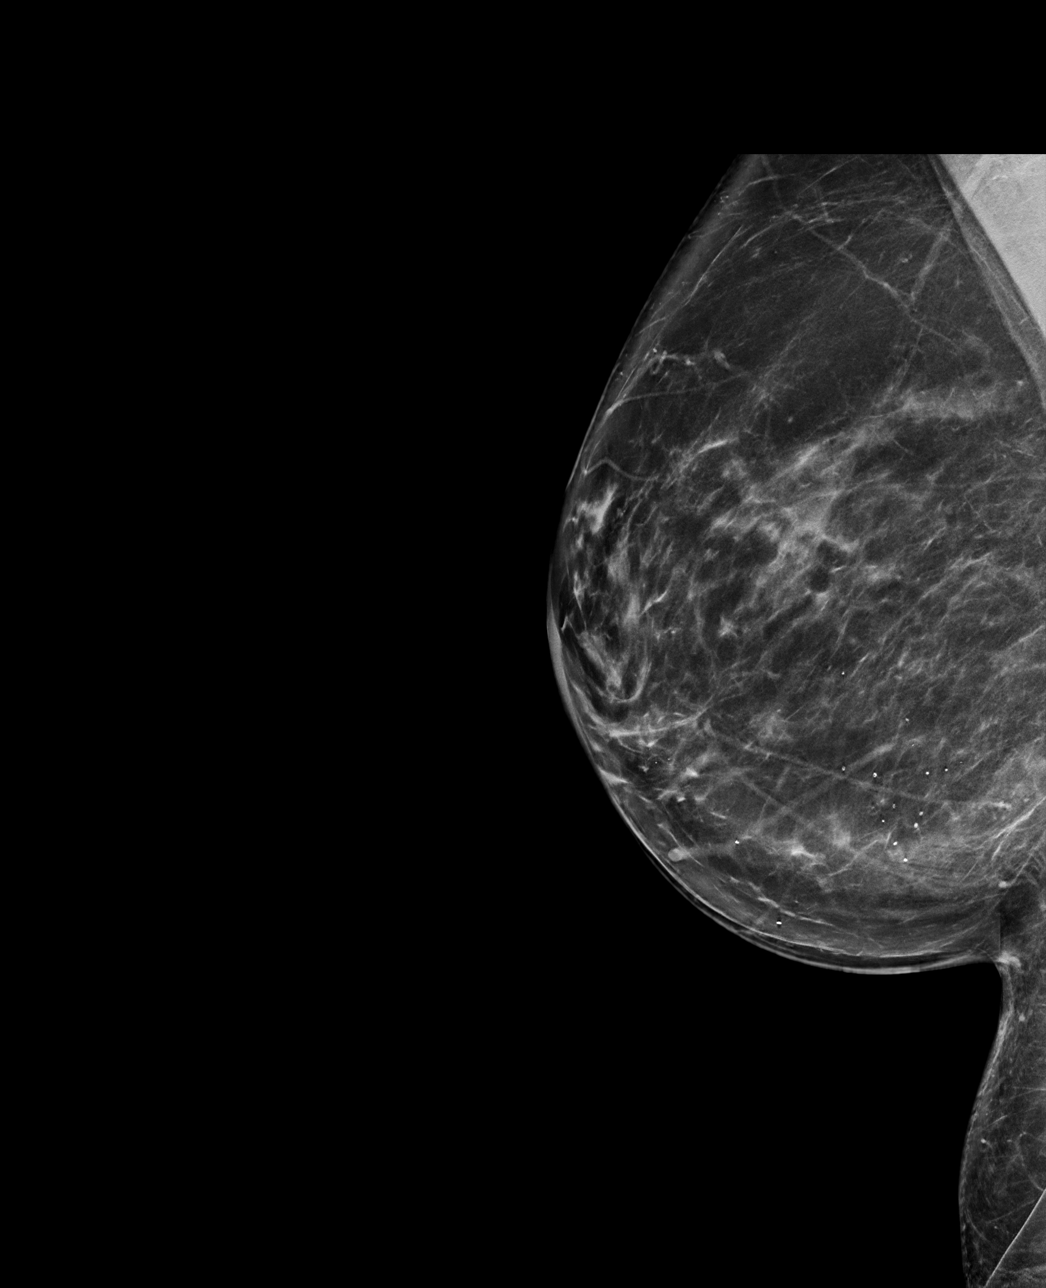

[R CC tomo · tomo slice 39/78.0]
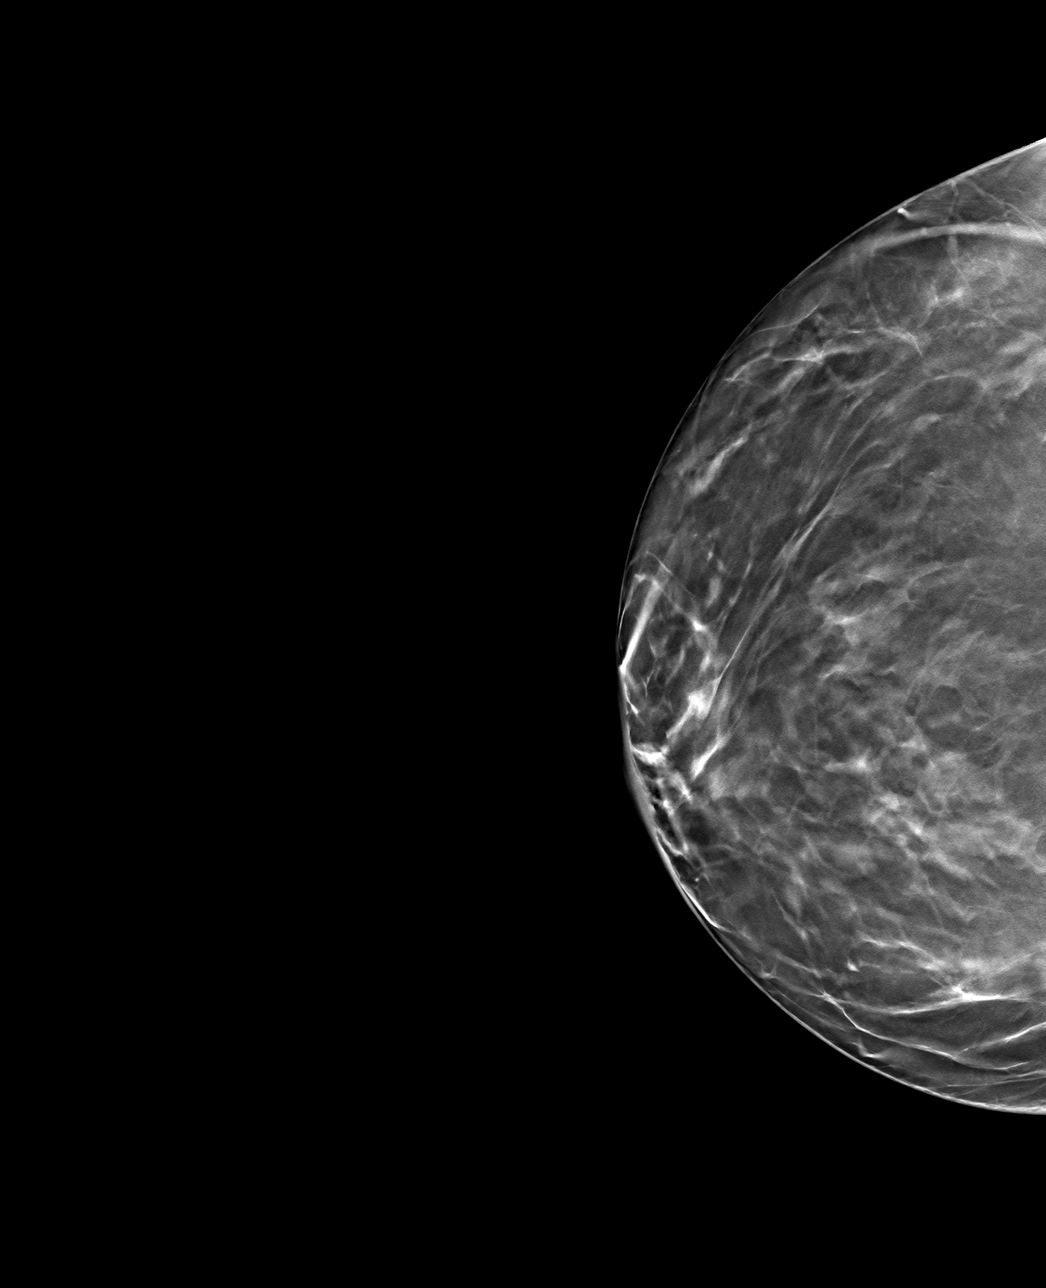

[R MLO tomo · tomo slice 45/88.0]
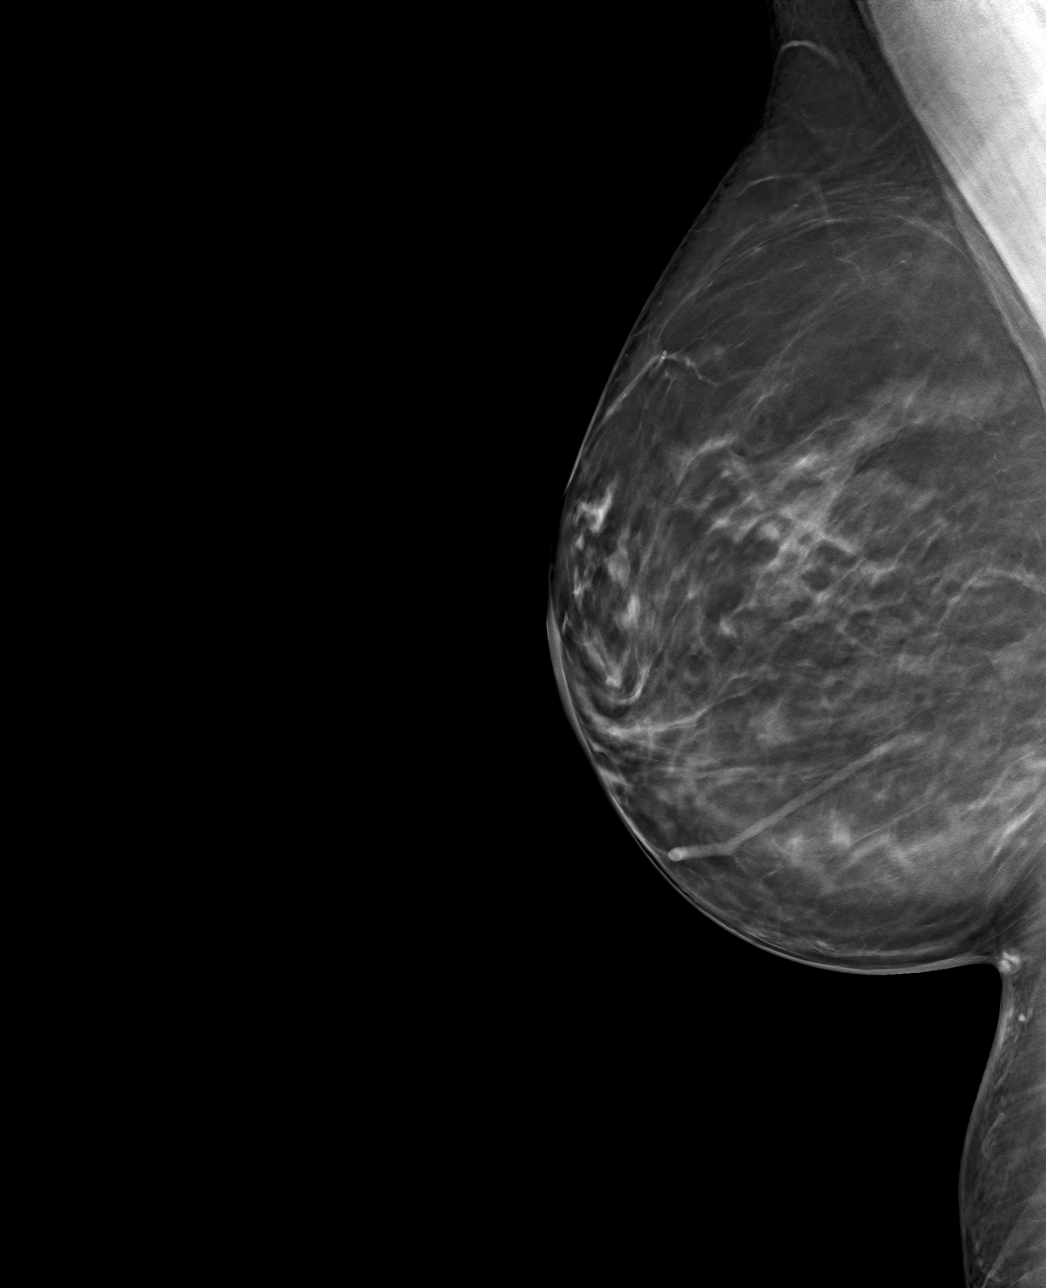

[4 of 12 positions shown; findings below may reference images not displayed]

ACR Breast Density Category c: The breast tissue is heterogeneously
dense, which may obscure small masses.
FINDINGS: There are no new dominant masses, suspicious calcifications or
secondary signs of malignancy within the RIGHT breast. Specifically,
there is no mammographic abnormality within the inner RIGHT breast
corresponding to the area of clinical concern.
IMPRESSION: No evidence of malignancy within the RIGHT breast.

RECOMMENDATION:
1. Annual screening mammograms. Next bilateral screening mammogram
will be due in Thursday March, 2022.
2. Breast pain is a common condition which will often resolve on its
own without intervention. Benign causes of breast pain were
discussed with the patient. Breast pain can be improved by
over-the-counter pain medications such as Aspercreme, oral NSAIDs,
and topical NSAIDs. Studies have shown an improvement with use of
evening primrose oil and Vitamin E. The patient was encouraged to
follow-up with referring physician if the pain persisted or worsened
as this might indicate a need to evaluate the deeper structures of
the underlying chest wall.

I have discussed the findings and recommendations with the patient.
If applicable, a reminder letter will be sent to the patient
regarding the next appointment.

BI-RADS CATEGORY  1: Negative.

## 2023-12-19 ENCOUNTER — Other Ambulatory Visit (HOSPITAL_COMMUNITY): Payer: Self-pay

## 2023-12-19 MED ORDER — BUPROPION HCL ER (XL) 450 MG PO TB24
450.0000 mg | ORAL_TABLET | Freq: Every morning | ORAL | 3 refills | Status: AC
  Filled 2023-12-19: qty 90, 90d supply, fill #0

## 2023-12-24 ENCOUNTER — Other Ambulatory Visit (HOSPITAL_COMMUNITY): Payer: Self-pay

## 2024-01-18 DIAGNOSIS — E162 Hypoglycemia, unspecified: Secondary | ICD-10-CM | POA: Diagnosis not present

## 2024-02-08 ENCOUNTER — Other Ambulatory Visit (HOSPITAL_COMMUNITY): Payer: Self-pay

## 2024-02-08 MED ORDER — CLONAZEPAM 0.5 MG PO TABS
ORAL_TABLET | ORAL | 2 refills | Status: AC
  Filled 2024-02-08: qty 90, 30d supply, fill #0
  Filled 2024-03-19: qty 90, 30d supply, fill #1

## 2024-02-08 MED ORDER — OZEMPIC (0.25 OR 0.5 MG/DOSE) 2 MG/3ML ~~LOC~~ SOPN
0.2500 mg | PEN_INJECTOR | SUBCUTANEOUS | 11 refills | Status: AC
  Filled 2024-02-08 (×2): qty 3, 56d supply, fill #0

## 2024-02-11 ENCOUNTER — Encounter (HOSPITAL_COMMUNITY): Payer: Self-pay

## 2024-02-11 ENCOUNTER — Other Ambulatory Visit (HOSPITAL_COMMUNITY): Payer: Self-pay

## 2024-02-28 ENCOUNTER — Other Ambulatory Visit (HOSPITAL_COMMUNITY): Payer: Self-pay

## 2024-03-12 ENCOUNTER — Other Ambulatory Visit (HOSPITAL_COMMUNITY): Payer: Self-pay

## 2024-03-12 MED ORDER — OZEMPIC (0.25 OR 0.5 MG/DOSE) 2 MG/3ML ~~LOC~~ SOPN
0.5000 mg | PEN_INJECTOR | SUBCUTANEOUS | 1 refills | Status: AC
  Filled 2024-03-12: qty 3, 28d supply, fill #0

## 2024-03-20 ENCOUNTER — Other Ambulatory Visit: Payer: Self-pay

## 2024-03-20 ENCOUNTER — Other Ambulatory Visit (HOSPITAL_COMMUNITY): Payer: Self-pay
